# Patient Record
Sex: Male | Born: 1960 | Race: White | Hispanic: No | Marital: Married | Smoking: Never smoker
Health system: Southern US, Community
[De-identification: ages and names within clinical notes are randomized; demographics above are authoritative.]

## PROBLEM LIST (undated history)

## (undated) DIAGNOSIS — K589 Irritable bowel syndrome without diarrhea: Secondary | ICD-10-CM

## (undated) DIAGNOSIS — F329 Major depressive disorder, single episode, unspecified: Secondary | ICD-10-CM

## (undated) DIAGNOSIS — E119 Type 2 diabetes mellitus without complications: Secondary | ICD-10-CM

## (undated) DIAGNOSIS — R6881 Early satiety: Secondary | ICD-10-CM

## (undated) DIAGNOSIS — R1013 Epigastric pain: Secondary | ICD-10-CM

## (undated) DIAGNOSIS — E785 Hyperlipidemia, unspecified: Secondary | ICD-10-CM

## (undated) DIAGNOSIS — K219 Gastro-esophageal reflux disease without esophagitis: Secondary | ICD-10-CM

## (undated) DIAGNOSIS — R0609 Other forms of dyspnea: Secondary | ICD-10-CM

## (undated) DIAGNOSIS — F419 Anxiety disorder, unspecified: Secondary | ICD-10-CM

## (undated) DIAGNOSIS — R000 Tachycardia, unspecified: Secondary | ICD-10-CM

## (undated) DIAGNOSIS — D649 Anemia, unspecified: Secondary | ICD-10-CM

## (undated) DIAGNOSIS — F32A Depression, unspecified: Secondary | ICD-10-CM

## (undated) DIAGNOSIS — R634 Abnormal weight loss: Secondary | ICD-10-CM

## (undated) DIAGNOSIS — B029 Zoster without complications: Secondary | ICD-10-CM

## (undated) DIAGNOSIS — R0600 Dyspnea, unspecified: Secondary | ICD-10-CM

## (undated) HISTORY — PX: TONSILLECTOMY: SUR1361

## (undated) HISTORY — PX: TOOTH EXTRACTION: SUR596

## (undated) HISTORY — DX: Zoster without complications: B02.9

## (undated) HISTORY — PX: VASECTOMY: SHX75

## (undated) HISTORY — DX: Type 2 diabetes mellitus without complications: E11.9

---

## 2006-05-20 ENCOUNTER — Ambulatory Visit: Payer: Self-pay | Admitting: Family Medicine

## 2007-11-25 ENCOUNTER — Emergency Department: Payer: Self-pay | Admitting: Emergency Medicine

## 2007-12-06 ENCOUNTER — Other Ambulatory Visit: Payer: Self-pay

## 2007-12-06 ENCOUNTER — Ambulatory Visit: Payer: Self-pay | Admitting: Orthopedic Surgery

## 2007-12-08 ENCOUNTER — Ambulatory Visit: Payer: Self-pay | Admitting: Orthopedic Surgery

## 2008-05-18 HISTORY — PX: FRACTURE SURGERY: SHX138

## 2008-10-17 ENCOUNTER — Ambulatory Visit: Payer: Self-pay | Admitting: Family Medicine

## 2009-03-18 IMAGING — CR DG CLAVICLE*L*
1 series · 2 of 2 positions shown · non-contrast
Comparison: none

REASON FOR EXAM: post open reduction and internal fixation left clavicle
COMMENTS:   Bedside (portable):Y

[Series 1: view not recorded · 0.17mm/px · 2 of 2 slices shown]
[im 1/2]
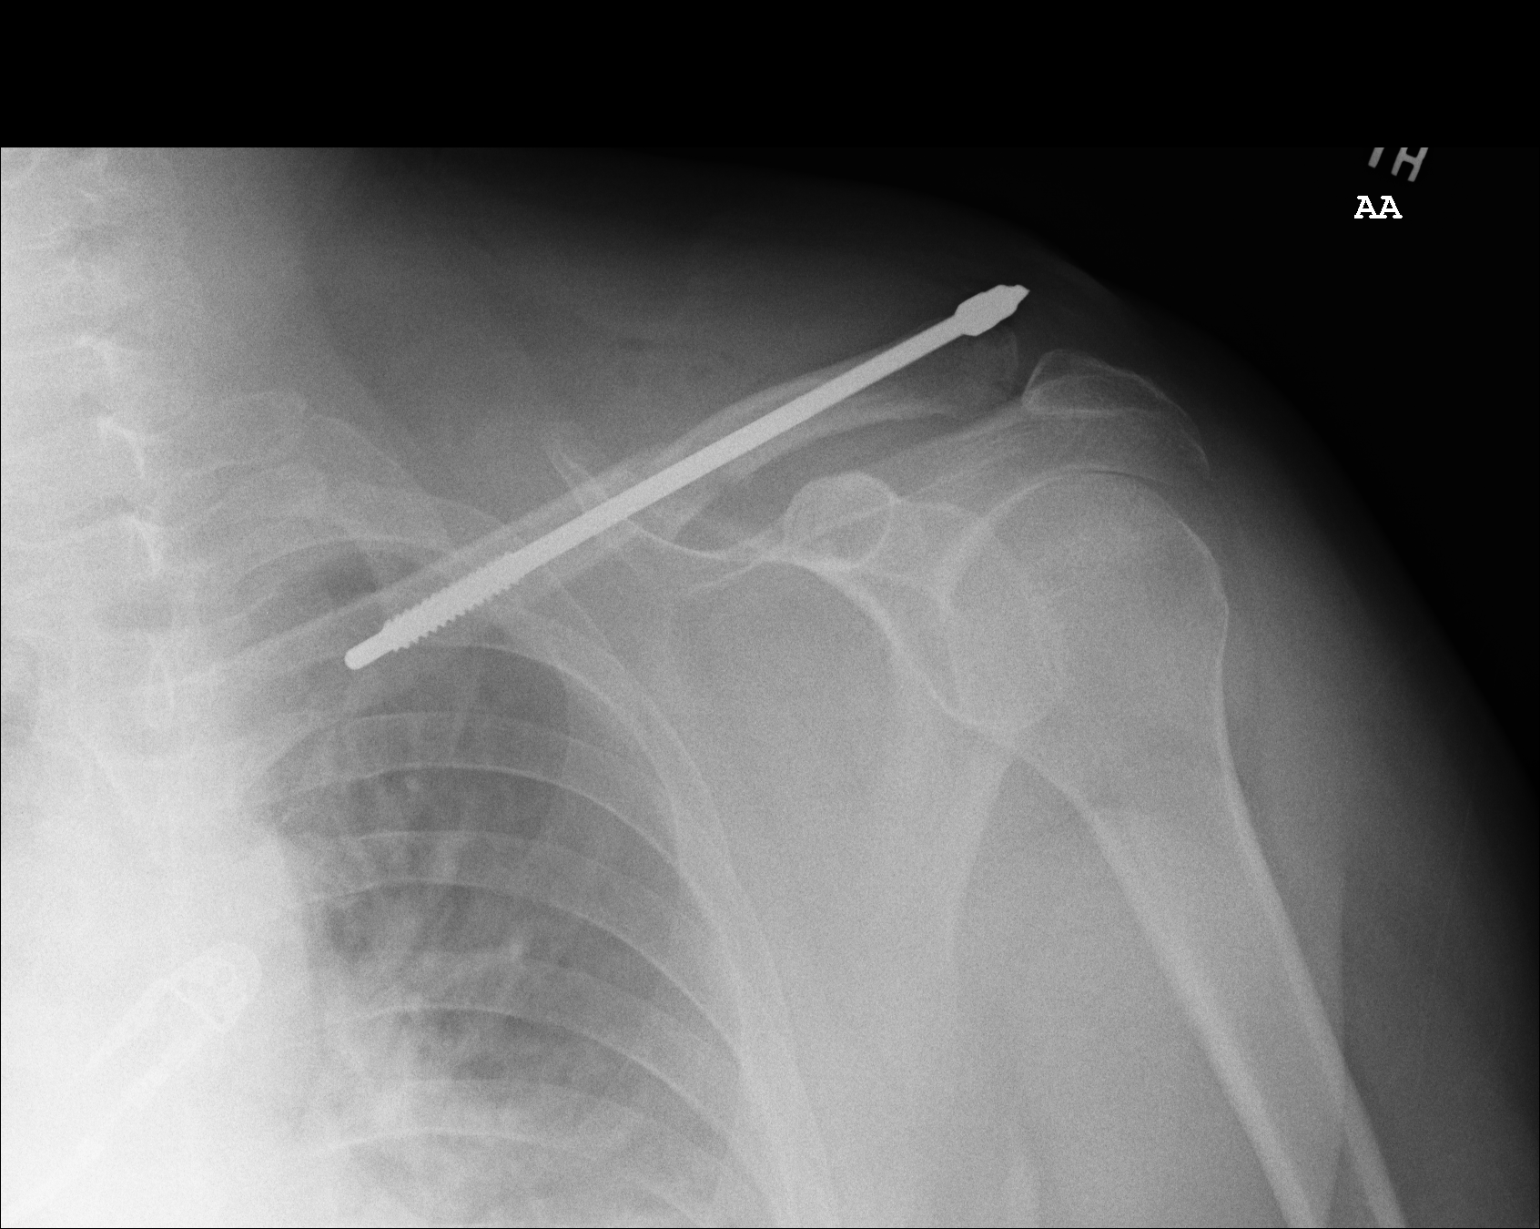
[im 2/2]
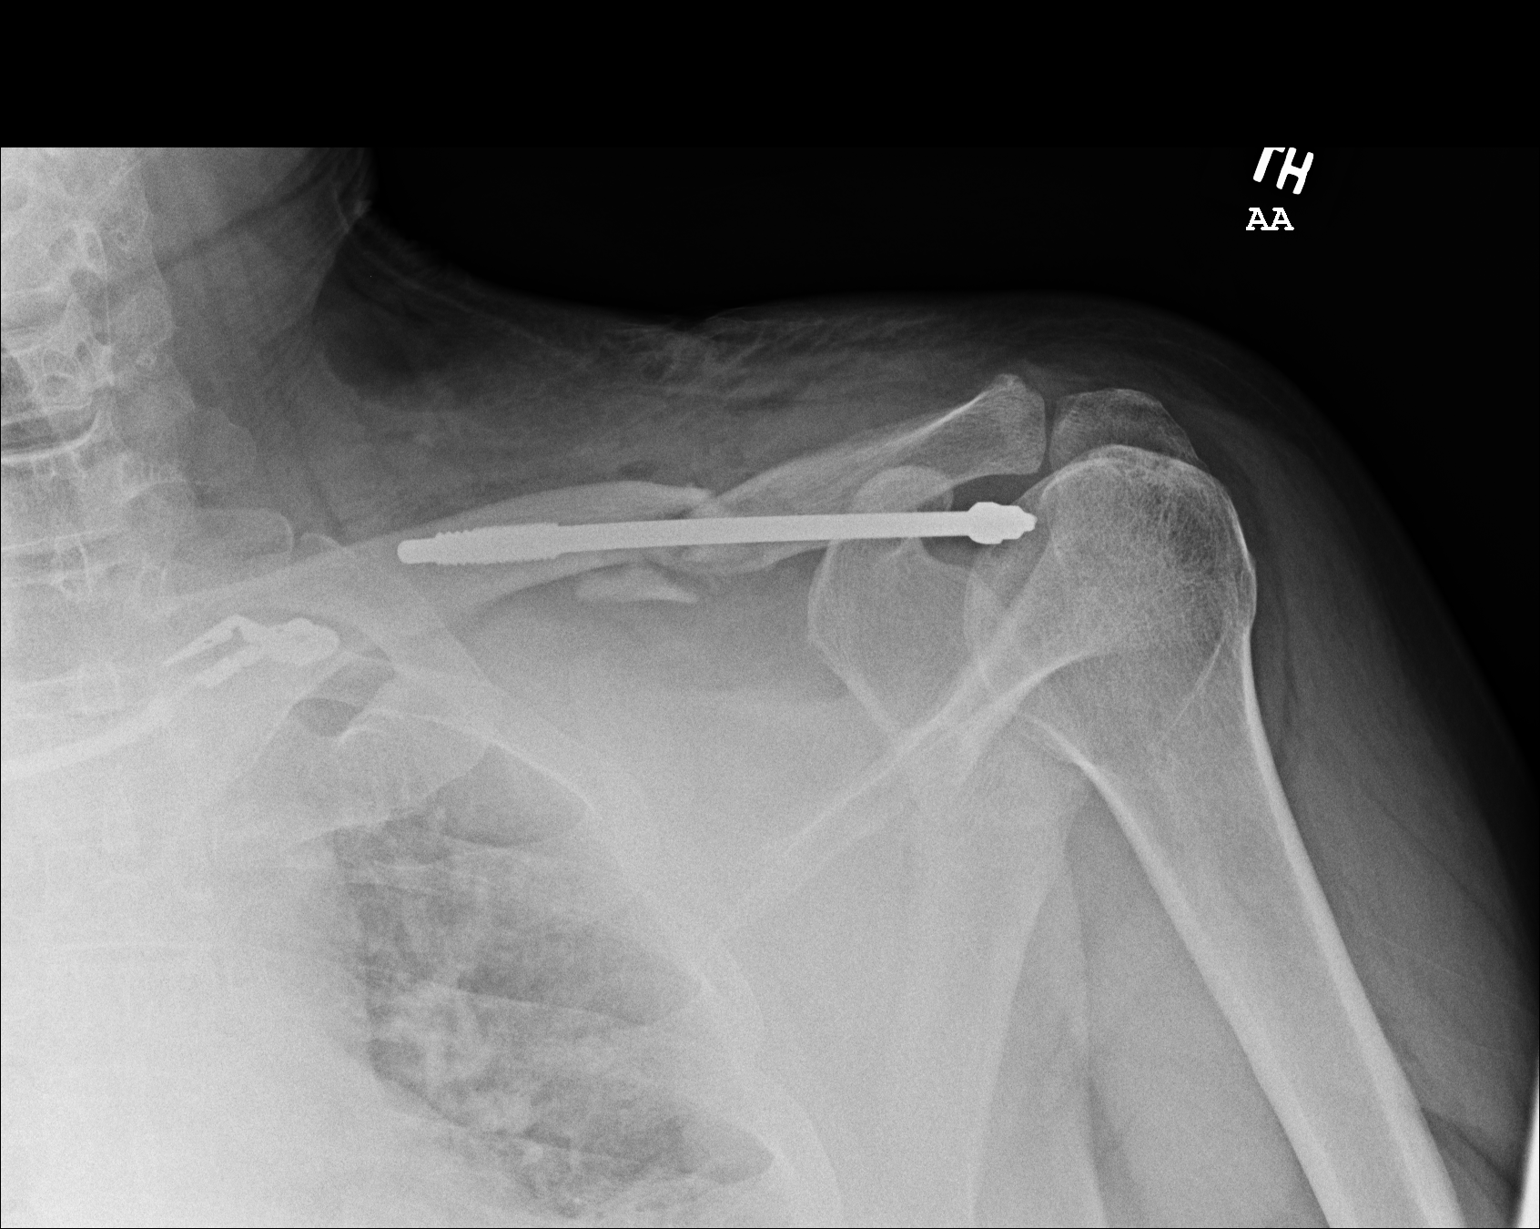

[2 of 2 positions shown; findings below may reference images not displayed]

PROCEDURE:     DXR - DXR CLAVICLE LEFT  - December 08, 2007  [DATE]

RESULT:     Images of the LEFT clavicle demonstrate an orthopedic pin
through the clavicle. The hardware is intact. A comminuted mid shaft LEFT
clavicular fracture is demonstrated.  In comparison with previous study of
11/25/2007, there is improved alignment of the comminuted mid shaft
clavicular fracture.
IMPRESSION: Please see above.

## 2009-04-01 ENCOUNTER — Encounter: Payer: Self-pay | Admitting: Cardiology

## 2009-04-02 ENCOUNTER — Encounter: Payer: Self-pay | Admitting: Cardiology

## 2010-06-18 NOTE — Progress Notes (Signed)
Summary: [facility]  [facility]   Imported By: Harlon Flor 04/03/2009 11:23:03  _____________________________________________________________________  External Attachment:    Type:   Image     Comment:   External Document

## 2016-04-20 ENCOUNTER — Emergency Department: Payer: 59

## 2016-04-20 ENCOUNTER — Encounter: Payer: Self-pay | Admitting: *Deleted

## 2016-04-20 ENCOUNTER — Emergency Department
Admission: EM | Admit: 2016-04-20 | Discharge: 2016-04-20 | Disposition: A | Discharge status: Home / Self Care | Payer: 59 | Attending: Emergency Medicine | Admitting: Emergency Medicine

## 2016-04-20 DIAGNOSIS — R1011 Right upper quadrant pain: Secondary | ICD-10-CM | POA: Diagnosis present

## 2016-04-20 DIAGNOSIS — R0602 Shortness of breath: Secondary | ICD-10-CM | POA: Insufficient documentation

## 2016-04-20 DIAGNOSIS — R05 Cough: Secondary | ICD-10-CM | POA: Diagnosis not present

## 2016-04-20 DIAGNOSIS — R0789 Other chest pain: Secondary | ICD-10-CM

## 2016-04-20 LAB — BASIC METABOLIC PANEL
Anion gap: 9 (ref 5–15)
BUN: 17 mg/dL (ref 6–20)
CO2: 23 mmol/L (ref 22–32)
Calcium: 9.5 mg/dL (ref 8.9–10.3)
Chloride: 101 mmol/L (ref 101–111)
Creatinine, Ser: 0.94 mg/dL (ref 0.61–1.24)
GFR calc Af Amer: >60 mL/min (ref >60)
GFR calc non Af Amer: >60 mL/min (ref >60)
Glucose, Bld: 360 mg/dL — ABNORMAL HIGH (ref 65–99)
Potassium: 3.8 mmol/L (ref 3.5–5.1)
Sodium: 133 mmol/L — ABNORMAL LOW (ref 135–145)

## 2016-04-20 LAB — CBC
HCT: 43.3 % (ref 40.0–52.0)
Hemoglobin: 15.7 g/dL (ref 13.0–18.0)
MCH: 32.6 pg (ref 26.0–34.0)
MCHC: 36.2 g/dL — ABNORMAL HIGH (ref 32.0–36.0)
MCV: 90.2 fL (ref 80.0–100.0)
Platelets: 208 10*3/uL (ref 150–440)
RBC: 4.8 MIL/uL (ref 4.40–5.90)
RDW: 13 % (ref 11.5–14.5)
WBC: 10.3 10*3/uL (ref 3.8–10.6)

## 2016-04-20 LAB — HEPATIC FUNCTION PANEL
ALT: 21 U/L (ref 17–63)
AST: 25 U/L (ref 15–41)
Albumin: 4.2 g/dL (ref 3.5–5.0)
Alkaline Phosphatase: 97 U/L (ref 38–126)
Bilirubin, Direct: 0.1 mg/dL (ref 0.1–0.5)
Indirect Bilirubin: 1.3 mg/dL — ABNORMAL HIGH (ref 0.3–0.9)
Total Bilirubin: 1.4 mg/dL — ABNORMAL HIGH (ref 0.3–1.2)
Total Protein: 7.7 g/dL (ref 6.5–8.1)

## 2016-04-20 LAB — TROPONIN I: Troponin I: <0.03 ng/mL (ref <0.03)

## 2016-04-20 LAB — FIBRIN DERIVATIVES D-DIMER (ARMC ONLY): Fibrin derivatives D-dimer (ARMC): 124 (ref 0–499)

## 2016-04-20 LAB — LIPASE, BLOOD: Lipase: 27 U/L (ref 11–51)

## 2016-04-20 MED ORDER — VALACYCLOVIR HCL 1 G PO TABS: 1000.0000 mg | ORAL_TABLET | Freq: Three times a day (TID) | ORAL | 0 refills | Status: DC

## 2016-04-20 MED ORDER — TRAMADOL HCL 50 MG PO TABS: 50.0000 mg | ORAL_TABLET | Freq: Four times a day (QID) | ORAL | 0 refills | Status: DC | PRN

## 2016-04-20 NOTE — ED Triage Notes (Signed)
Pt reports right side chest pain since thanksgiving day. Pain has since moved into middle of chest and back.  Pt seen at urgent care and was taking naproxen.  Intermittent sob.  No n/v/d.  Pt alert.

## 2016-04-20 NOTE — ED Notes (Signed)
Resumed care from kate rn.  Pt alert.  nsr on monitor.

## 2016-04-20 NOTE — ED Provider Notes (Signed)
[facility] Emergency Department Provider Note   ____________________________________________   I have reviewed the triage vital signs and the nursing notes.   HISTORY  Chief Complaint Chest Pain   History limited by: Not Limited   HPI Jeffrey Velazquez is a 55 y.o. male who presents to the emergency department today because of right lower chest and right upper abdominal pain. This pain started roughly 10 days ago. It was located more in the front of his chest. It is now started spreading around to his back and across the front of his chest. It does not cross the midline. He describes it is severe pain. Does feel like some needles or try to come out from the inside. He has had some associated shortness of breath. He has had a cough although it is been nonproductive. No fevers. No history of trauma. No rash.   No past medical history on file.  There are no active problems to display for this patient.   No past surgical history on file.  Prior to Admission medications   Not on File    Allergies Codeine  No family history on file.  Social History Social History  Substance Use Topics  . Smoking status: Never Smoker  . Smokeless tobacco: Never Used  . Alcohol use No    Review of Systems  Constitutional: Negative for fever. Cardiovascular: Negative for chest pain. Respiratory: Positive for shortness of breath and cough. Gastrointestinal: Negative for abdominal pain, vomiting and diarrhea. Genitourinary: Negative for dysuria. Musculoskeletal: Positive for right lower chest pain. Skin: Negative for rash. Neurological: Negative for headaches, focal weakness or numbness.  10-point ROS otherwise negative.  ____________________________________________   PHYSICAL EXAM:  VITAL SIGNS: ED Triage Vitals  Enc Vitals Group     BP 04/20/16 1910 (!) 145/70     Pulse Rate 04/20/16 1910 98     Resp 04/20/16 1910 20     Temp 04/20/16 1910 98.1 F  (36.7 C)     Temp Source 04/20/16 1910 Oral     SpO2 04/20/16 1910 100 %     Weight 04/20/16 1907 177 lb (80.3 kg)     Height 04/20/16 1907 5\' 8"  (1.727 m)     Head Circumference --      Peak Flow --      Pain Score 04/20/16 1907 3   Constitutional: Alert and oriented. Well appearing and in no distress. Eyes: Conjunctivae are normal. Normal extraocular movements. ENT   Head: Normocephalic and atraumatic.   Nose: No congestion/rhinnorhea.   Mouth/Throat: Mucous membranes are moist.   Neck: No stridor. Hematological/Lymphatic/Immunilogical: No cervical lymphadenopathy. Cardiovascular: Normal rate, regular rhythm.  No murmurs, rubs, or gallops.  Respiratory: Normal respiratory effort without tachypnea nor retractions. Breath sounds are clear and equal bilaterally. No wheezes/rales/rhonchi. Gastrointestinal: Soft and slightly tender in the right upper quadrant.  Genitourinary: Deferred Musculoskeletal: Normal range of motion in all extremities. No lower extremity edema. Neurologic:  Normal speech and language. No gross focal neurologic deficits are appreciated.  Skin:  Skin is warm, dry and intact. No rash noted. Psychiatric: Mood and affect are normal. Speech and behavior are normal. Patient exhibits appropriate insight and judgment.  ____________________________________________    LABS (pertinent positives/negatives)  Labs Reviewed  BASIC METABOLIC PANEL - Abnormal; Notable for the following:       Result Value   Sodium 133 (*)    Glucose, Bld 360 (*)    All other components within normal limits  CBC -  Abnormal; Notable for the following:    MCHC 36.2 (*)    All other components within normal limits  TROPONIN I     ____________________________________________   EKG  I, Phineas SemenGraydon Cecila Satcher, attending physician, personally viewed and interpreted this EKG  EKG Time: 1908 Rate: 95 Rhythm: normal sinus rhythm Axis: normal Intervals: qtc 454 QRS: narrow ST  changes: no st elevation Impression: normal ekg   ____________________________________________    RADIOLOGY  CXR IMPRESSION:  No active cardiopulmonary disease.   ____________________________________________   PROCEDURES  Procedures  ____________________________________________   INITIAL IMPRESSION / ASSESSMENT AND PLAN / ED COURSE  Pertinent labs & imaging results that were available during my care of the patient were reviewed by me and considered in my medical decision making (see chart for details).  Patient presented to the emergency department today because of concerns for right lower chest right upper quadrant abdominal pain this been going on for about the past 10 days. Physical exam he is mildly tender over the area. No rash noted. Work without any concerning signs. Will plan on obtaining a d-dimer to rule out PE given that he has had some shortness of breath. Additionally will get right upper quadrant ultrasound.  Clinical Course    Right upper quadrant ultrasound and d-dimer are both negative. This point I think most likely examination to be shingles without the classic eruption. Patient's pain certainly is distributed in a dermatomal pattern. Will plan on giving patient prescription for pain medication and antivirals. ____________________________________________   FINAL CLINICAL IMPRESSION(S) / ED DIAGNOSES  Final diagnoses:  RUQ pain  Chest wall pain     Note: This dictation was prepared with Dragon dictation. Any transcriptional errors that result from this process are unintentional    Phineas SemenGraydon Ronnica Dreese, MD 04/20/16 [phone]

## 2016-04-20 NOTE — ED Notes (Signed)
Pt states central CP that radiates to R side and back. Pt states pain began thanksgiving. Pt states 1 week before that he had a rubber sheet about 20lb hit him in central chest. Pt states deep breathing, movement, eating does not make pain worse. Pt states some nausea but denies SOB, weakness, vomiting, sweating. 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 Pt in no distress at this time, speaking in complete sentences, lying on stretcher, wife at bedside.

## 2016-04-20 NOTE — Discharge Instructions (Signed)
Please seek medical attention for any high fevers, chest pain, shortness of breath, change in behavior, persistent vomiting, bloody stool or any other new or concerning symptoms.  

## 2016-04-23 ENCOUNTER — Emergency Department
Admission: EM | Admit: 2016-04-23 | Discharge: 2016-04-23 | Disposition: A | Discharge status: Home / Self Care | Payer: 59 | Attending: Emergency Medicine | Admitting: Emergency Medicine

## 2016-04-23 ENCOUNTER — Encounter: Payer: Self-pay | Admitting: *Deleted

## 2016-04-23 ENCOUNTER — Encounter: Payer: Self-pay | Admitting: Family Medicine

## 2016-04-23 ENCOUNTER — Ambulatory Visit (INDEPENDENT_AMBULATORY_CARE_PROVIDER_SITE_OTHER): Payer: 59 | Admitting: Family Medicine

## 2016-04-23 ENCOUNTER — Emergency Department: Payer: 59

## 2016-04-23 VITALS — BP 142/86 | HR 96 | Temp 97.7°F | Ht 68.0 in | Wt 172.0 lb

## 2016-04-23 DIAGNOSIS — E139 Other specified diabetes mellitus without complications: Secondary | ICD-10-CM | POA: Diagnosis not present

## 2016-04-23 DIAGNOSIS — R0602 Shortness of breath: Secondary | ICD-10-CM | POA: Diagnosis not present

## 2016-04-23 DIAGNOSIS — R109 Unspecified abdominal pain: Secondary | ICD-10-CM | POA: Insufficient documentation

## 2016-04-23 DIAGNOSIS — R0789 Other chest pain: Secondary | ICD-10-CM | POA: Diagnosis not present

## 2016-04-23 DIAGNOSIS — Z794 Long term (current) use of insulin: Secondary | ICD-10-CM | POA: Diagnosis not present

## 2016-04-23 DIAGNOSIS — Z791 Long term (current) use of non-steroidal anti-inflammatories (NSAID): Secondary | ICD-10-CM | POA: Insufficient documentation

## 2016-04-23 DIAGNOSIS — R079 Chest pain, unspecified: Secondary | ICD-10-CM

## 2016-04-23 LAB — URINALYSIS, COMPLETE (UACMP) WITH MICROSCOPIC
Bacteria, UA: NONE SEEN
Bilirubin Urine: NEGATIVE
Glucose, UA: >=500 mg/dL — AB
Hgb urine dipstick: NEGATIVE
Ketones, ur: 20 mg/dL — AB
Leukocytes, UA: NEGATIVE
Nitrite: NEGATIVE
Protein, ur: NEGATIVE mg/dL
Specific Gravity, Urine: 1.038 — ABNORMAL HIGH (ref 1.005–1.030)
Squamous Epithelial / LPF: NONE SEEN
pH: 5 (ref 5.0–8.0)

## 2016-04-23 LAB — CBC WITH DIFFERENTIAL/PLATELET
Basophils Absolute: 0 10*3/uL (ref 0–0.1)
Basophils Relative: 0 %
Eosinophils Absolute: 0 10*3/uL (ref 0–0.7)
Eosinophils Relative: 0 %
HCT: 43.4 % (ref 40.0–52.0)
Hemoglobin: 15.8 g/dL (ref 13.0–18.0)
Lymphocytes Relative: 20 %
Lymphs Abs: 1.9 10*3/uL (ref 1.0–3.6)
MCH: 32 pg (ref 26.0–34.0)
MCHC: 36.3 g/dL — ABNORMAL HIGH (ref 32.0–36.0)
MCV: 88 fL (ref 80.0–100.0)
Monocytes Absolute: 0.8 10*3/uL (ref 0.2–1.0)
Monocytes Relative: 8 %
Neutro Abs: 7 10*3/uL — ABNORMAL HIGH (ref 1.4–6.5)
Neutrophils Relative %: 72 %
Platelets: 190 10*3/uL (ref 150–440)
RBC: 4.93 MIL/uL (ref 4.40–5.90)
RDW: 12.6 % (ref 11.5–14.5)
WBC: 9.8 10*3/uL (ref 3.8–10.6)

## 2016-04-23 LAB — TROPONIN I: Troponin I: <0.03 ng/mL (ref <0.03)

## 2016-04-23 LAB — BASIC METABOLIC PANEL
Anion gap: 10 (ref 5–15)
BUN: 14 mg/dL (ref 6–20)
CO2: 23 mmol/L (ref 22–32)
Calcium: 9.5 mg/dL (ref 8.9–10.3)
Chloride: 103 mmol/L (ref 101–111)
Creatinine, Ser: 0.88 mg/dL (ref 0.61–1.24)
GFR calc Af Amer: >60 mL/min (ref >60)
GFR calc non Af Amer: >60 mL/min (ref >60)
Glucose, Bld: 235 mg/dL — ABNORMAL HIGH (ref 65–99)
Potassium: 3.8 mmol/L (ref 3.5–5.1)
Sodium: 136 mmol/L (ref 135–145)

## 2016-04-23 MED ORDER — METFORMIN HCL 500 MG PO TABS: 500.0000 mg | ORAL_TABLET | Freq: Every day | ORAL | 0 refills | Status: DC

## 2016-04-23 MED ORDER — IOPAMIDOL (ISOVUE-370) INJECTION 76%
75.0000 mL | Freq: Once | INTRAVENOUS | Status: AC | PRN
  Administered 2016-04-23: 75 mL via INTRAVENOUS

## 2016-04-23 MED ORDER — SODIUM CHLORIDE 0.9 % IV BOLUS (SEPSIS)
1000.0000 mL | Freq: Once | INTRAVENOUS | Status: AC
  Administered 2016-04-23: 1000 mL via INTRAVENOUS

## 2016-04-23 NOTE — ED Triage Notes (Signed)
Pt was seen in ED Monday, diagnosed with shingles, no rash noted, pt complains of right sided chest pain, sent by MD

## 2016-04-23 NOTE — ED Provider Notes (Addendum)
[facility] Emergency Department Provider Note  ____________________________________________   First MD Initiated Contact with Patient 04/23/16 1645     (approximate)3  I have reviewed the triage vital signs and the nursing notes.   HISTORY  Chief Complaint Chest Pain and Herpes Zoster   HPI CAINAN TRULL is a 55 y.o. male without any chronic medical problems presenting with right-sided chest pain. He says that the pain started around the time of Thanksgiving and feels like a stinging, burning pain that started in his right lateral chest and has been radiating to the front of the chest. He was diagnosed with shingles 3 days ago and treated presumptively with valacyclovir. However, since that his pain is worsened and he was sent to the emergency department today for a likely CT angiography to rule out PE. However, he had a negative d-dimer during his visit on December 4.He says the pain is a 4 out of 10 at this time. Denies any shortness of breath at this time. Says that he awoke last Tylenol night and could not get back to sleep for several hours because of the intensity of his pain. He is now requesting any pain medication at this time. He has been compliant with his outpatient meds.   Past Medical History:  Diagnosis Date  . Shingles     There are no active problems to display for this patient.   History reviewed. No pertinent surgical history.  Prior to Admission medications   Medication Sig Start Date End Date Taking? Authorizing Provider  naproxen (NAPROSYN) 500 MG tablet Take 500 mg by mouth 2 (two) times daily with a meal.    Historical Provider, MD  traMADol (ULTRAM) 50 MG tablet Take 1 tablet (50 mg total) by mouth every 6 (six) hours as needed. Patient not taking: Reported on 04/23/2016 04/20/16 04/20/17  Phineas Semen, MD  valACYclovir (VALTREX) 1000 MG tablet Take 1 tablet (1,000 mg total) by mouth 3 (three) times daily. 04/20/16   Phineas Semen, MD    Allergies Codeine  No family history on file.  Social History Social History  Substance Use Topics  . Smoking status: Never Smoker  . Smokeless tobacco: Never Used  . Alcohol use No    Review of Systems Constitutional: No fever/chills Eyes: No visual changes. ENT: No sore throat. Cardiovascular: As above Respiratory: Denies shortness of breath. Gastrointestinal: No abdominal pain.  No nausea, no vomiting.  No diarrhea.  No constipation. Genitourinary: Negative for dysuria. Musculoskeletal: Negative for back pain. Skin: Negative for rash. Neurological: Negative for headaches, focal weakness or numbness.  10-point ROS otherwise negative.  ____________________________________________   PHYSICAL EXAM:  VITAL SIGNS: ED Triage Vitals  Enc Vitals Group     BP 04/23/16 1639 (!) 140/94     Pulse Rate 04/23/16 1639 90     Resp 04/23/16 1639 20     Temp 04/23/16 1639 98.1 F (36.7 C)     Temp Source 04/23/16 1639 Oral     SpO2 04/23/16 1639 100 %     Weight 04/23/16 1642 172 lb (78 kg)     Height 04/23/16 1642 5\' 8"  (1.727 m)     Head Circumference --      Peak Flow --      Pain Score 04/23/16 1642 3     Pain Loc --      Pain Edu? --      Excl. in GC? --     Constitutional: Alert and oriented.  Well appearing and in no acute distress. Eyes: Conjunctivae are normal. PERRL. EOMI. Head: Atraumatic. Nose: No congestion/rhinnorhea. Mouth/Throat: Mucous membranes are moist.   Neck: No stridor.   Cardiovascular: Normal rate, regular rhythm. Grossly normal heart sounds.   Respiratory: Normal respiratory effort.  No retractions. Lungs CTAB. Gastrointestinal: Soft and nontender. No distention.  Musculoskeletal: No lower extremity tenderness nor edema.  No joint effusions. Neurologic:  Normal speech and language. No gross focal neurologic deficits are appreciated.  Skin:  Skin is warm, dry and intact. No rash noted.No vesicles. No erythema. Psychiatric: Mood  and affect are normal. Speech and behavior are normal.  ____________________________________________   LABS (all labs ordered are listed, but only abnormal results are displayed)  Labs Reviewed  CBC WITH DIFFERENTIAL/PLATELET  BASIC METABOLIC PANEL  TROPONIN I  URINALYSIS, COMPLETE (UACMP) WITH MICROSCOPIC   ____________________________________________  EKG  ED ECG REPORT I, Cameren Earnest,  Teena Iraniavid M, the attending physician, personally viewed and interpreted this ECG.   Date: 04/23/2016  EKG Time: 1652  Rate: 91  Rhythm: normal sinus rhythm  Axis: Normal  Intervals:none  ST&T Change: No ST segment elevation or depression. No abnormal T-wave inversion.  ____________________________________________  RADIOLOGY  CT RENAL STONE STUDY (Final result)  Result time 04/23/16 18:00:48  Final result by Rise MuBenjamin McClintock, MD (04/23/16 18:00:48)           Narrative:   CLINICAL DATA: Initial evaluation for acute right flank pain.  EXAM: CT ABDOMEN AND PELVIS WITHOUT CONTRAST  TECHNIQUE: Multidetector CT imaging of the abdomen and pelvis was performed following the standard protocol without IV contrast.  COMPARISON: Prior ultrasound from 04/20/2016.  FINDINGS: Lower chest: Visualized lung bases are clear.  Hepatobiliary: Mild diffuse hypoattenuation of the liver compatible steatosis. Geographic fatty sparing noted within the central aspect of the liver and caudate lobe. Gallbladder within normal limits. No biliary dilatation.  Pancreas: Pancreas within normal limits.  Spleen: Spleen within normal limits.  Adrenals/Urinary Tract: Adrenal glands are normal. Kidneys equal in size. No nephrolithiasis or hydronephrosis. No radiopaque calculi seen along the course of either ureter. No hydroureter. Partially distended bladder within normal limits. No layering stones within the bladder lumen.  Stomach/Bowel: Stomach within normal limits. No evidence for bowel obstruction.  Appendix within normal limits. No acute inflammatory changes about the bowels. Mild colonic diverticulosis without evidence for acute diverticulitis.  Vascular/Lymphatic: Scattered atheromatous plaque within the intra- abdominal aorta. No aneurysm. No adenopathy.  Reproductive: Prostate normal.  Other: No free air or fluid. Small fat containing paraumbilical hernia noted.  Musculoskeletal: No acute osseous abnormality. No worrisome lytic or blastic osseous lesions.  IMPRESSION: 1. No CT evidence for nephrolithiasis or obstructive uropathy. 2. No other acute intra- abdominal or pelvic process identified. 3. Normal appendix. 4. Mild colonic diverticulosis without evidence for acute diverticulitis. 5. Hepatic steatosis.   Electronically Signed By: Rise MuBenjamin McClintock M.D. On: 04/23/2016 18:00          ____________________________________________   PROCEDURES  Procedure(s) performed:   Procedures  Critical Care performed:   ____________________________________________   INITIAL IMPRESSION / ASSESSMENT AND PLAN / ED COURSE  Pertinent labs & imaging results that were available during my care of the patient were reviewed by me and considered in my medical decision making (see chart for details).   Clinical Course   ----------------------------------------- 7:48 PM on 04/23/2016 -----------------------------------------  Patient resting comfortable at this time. Reassuring CAT scans of the chest as well as abdomen. Possible zoster that was caught during a prodrome and  never developed a rash. The patient started taking antiviral. He'll continue with antiviral. I will also be starting him on metformin but won't wait 48 hours since just had a CAT scan with contrast. He will follow-up with his primary care doctor for further testing including a likely A1c. He is understanding that he likely has new-onset diabetes and says that he was told that he had high blood sugar  several years ago but that was given a glucometer at home which ranged between 90 and 160 and was told that time they would not be started on medication. He doesn't a family history of diabetes.`   ____________________________________________   FINAL CLINICAL IMPRESSION(S) / ED DIAGNOSES  Final diagnoses:  Right flank pain  Right flank pain. Diabetes.    NEW MEDICATIONS STARTED DURING THIS VISIT:  New Prescriptions   No medications on file     Note:  This document was prepared using Dragon voice recognition software and may include unintentional dictation errors.    Myrna Blazeravid Matthew Leng Montesdeoca, MD 04/23/16 1950  Patient also found to have thyroid nodule. I updated the patient about this newly following up with his primary care doctor. Added to his discharge instructions.    Myrna Blazeravid Matthew Jessamy Torosyan, MD 04/23/16 Rosamaria [id number]

## 2016-04-23 NOTE — ED Notes (Signed)
Received report from patient's pcp stating that patient is being sent over to ED for chest pain.  PCP felt that patient needs a CT to rule out a PE.  PCP confirmed that patient was being sent for ED eval, not outpatient CT scan.

## 2016-04-23 NOTE — Progress Notes (Signed)
Name: Jeffrey GoodpastureJohn D Velazquez   MRN: [id number]    DOB: [birth year]   Date:04/23/2016       Progress Note  Subjective  Chief Complaint  Chief Complaint  Patient presents with  . Herpes Zoster    Pt stated went to hospt. last night for shingle last night..    Chest Pain   This is a new problem. The current episode started in the past 7 days. The onset quality is sudden. The problem occurs daily. The problem has been gradually worsening. The pain is present in the lateral region. The pain is at a severity of 4/10. The pain is moderate. The quality of the pain is described as burning. The pain radiates to the mid back. Associated symptoms include back pain, dizziness and shortness of breath. Pertinent negatives include no abdominal pain, claudication, cough, diaphoresis, exertional chest pressure, fever, headaches, hemoptysis, irregular heartbeat, leg pain, lower extremity edema, malaise/fatigue, nausea, near-syncope, numbness, orthopnea, palpitations, PND, sputum production, syncope, vomiting or weakness. The pain is aggravated by movement. Treatments tried: tramadol. The treatment provided mild relief. Prior diagnostic workup includes chest x-ray.    No problem-specific Assessment & Plan notes found for this encounter.   Past Medical History:  Diagnosis Date  . Shingles     History reviewed. No pertinent surgical history.  History reviewed. No pertinent family history.  Social History   Social History  . Marital status: Married    Spouse name: N/A  . Number of children: N/A  . Years of education: N/A   Occupational History  . Not on file.   Social History Main Topics  . Smoking status: Never Smoker  . Smokeless tobacco: Never Used  . Alcohol use No  . Drug use: Unknown  . Sexual activity: Not on file   Other Topics Concern  . Not on file   Social History Narrative  . No narrative on file    Allergies  Allergen Reactions  . Codeine Other (See Comments)     Review of  Systems  Constitutional: Negative for chills, diaphoresis, fever, malaise/fatigue and weight loss.  HENT: Negative for ear discharge, ear pain and sore throat.   Eyes: Negative for blurred vision.  Respiratory: Positive for shortness of breath. Negative for cough, hemoptysis, sputum production and wheezing.   Cardiovascular: Positive for chest pain. Negative for palpitations, orthopnea, claudication, leg swelling, syncope, PND and near-syncope.  Gastrointestinal: Negative for abdominal pain, blood in stool, constipation, diarrhea, heartburn, melena, nausea and vomiting.  Genitourinary: Negative for dysuria, frequency, hematuria and urgency.  Musculoskeletal: Positive for back pain. Negative for joint pain, myalgias and neck pain.  Skin: Negative for rash.  Neurological: Positive for dizziness. Negative for tingling, sensory change, focal weakness, weakness, numbness and headaches.  Endo/Heme/Allergies: Negative for environmental allergies and polydipsia. Does not bruise/bleed easily.  Psychiatric/Behavioral: Negative for depression and suicidal ideas. The patient is not nervous/anxious and does not have insomnia.      Objective  Vitals:   04/23/16 1442  BP: (!) 142/86  Pulse: 96  Temp: 97.7 F (36.5 C)  SpO2: 98%  Weight: 172 lb (78 kg)  Height: 5\' 8"  (1.727 m)    Physical Exam  Constitutional: He is oriented to person, place, and time and well-developed, well-nourished, and in no distress.  HENT:  Head: Normocephalic.  Right Ear: External ear normal.  Left Ear: External ear normal.  Nose: Nose normal.  Mouth/Throat: Oropharynx is clear and moist.  Eyes: Conjunctivae and EOM are normal. Pupils are equal,  round, and reactive to light. Right eye exhibits no discharge. Left eye exhibits no discharge. No scleral icterus.  Neck: Normal range of motion. Neck supple. No JVD present. No tracheal deviation present. No thyromegaly present.  Cardiovascular: Normal rate, regular rhythm,  normal heart sounds and intact distal pulses.  Exam reveals no gallop and no friction rub.   No murmur heard. Pulmonary/Chest: Breath sounds normal. No respiratory distress. He has no wheezes. He has no rales. Chest wall is not dull to percussion. He exhibits tenderness and bony tenderness. He exhibits no mass, no laceration, no crepitus, no edema, no deformity, no swelling and no retraction.    Abdominal: Soft. Bowel sounds are normal. He exhibits no mass. There is no hepatosplenomegaly. There is no tenderness. There is no rebound, no guarding and no CVA tenderness.  Musculoskeletal: Normal range of motion. He exhibits no edema or tenderness.  Lymphadenopathy:    He has no cervical adenopathy.  Neurological: He is alert and oriented to person, place, and time. He has normal sensation, normal strength, normal reflexes and intact cranial nerves. No cranial nerve deficit.  Skin: Skin is warm. No rash noted.  Psychiatric: Mood and affect normal.  Nursing note and vitals reviewed.     Assessment & Plan  Problem List Items Addressed This Visit    None    Visit Diagnoses    Chest pain, unspecified type    -  Primary   ct angiogram   Shortness of breath       Right-sided chest wall pain            Dr. Elizabeth Sauereanna Danylah Holden [facility]  04/23/16

## 2016-04-23 NOTE — ED Notes (Signed)
Pt taken to CT at this time.

## 2016-05-21 DIAGNOSIS — Z7189 Other specified counseling: Secondary | ICD-10-CM | POA: Insufficient documentation

## 2016-05-21 DIAGNOSIS — Z7185 Encounter for immunization safety counseling: Secondary | ICD-10-CM | POA: Insufficient documentation

## 2016-06-08 DIAGNOSIS — E119 Type 2 diabetes mellitus without complications: Secondary | ICD-10-CM | POA: Insufficient documentation

## 2016-06-08 DIAGNOSIS — E782 Mixed hyperlipidemia: Secondary | ICD-10-CM | POA: Insufficient documentation

## 2016-07-10 ENCOUNTER — Other Ambulatory Visit: Payer: Self-pay | Admitting: Neurology

## 2016-07-10 DIAGNOSIS — B0229 Other postherpetic nervous system involvement: Secondary | ICD-10-CM

## 2016-07-21 ENCOUNTER — Other Ambulatory Visit: Payer: Self-pay | Admitting: General Surgery

## 2016-07-21 ENCOUNTER — Ambulatory Visit
Admission: RE | Admit: 2016-07-21 | Discharge: 2016-07-21 | Disposition: A | Discharge status: Home / Self Care | Payer: 59 | Source: Ambulatory Visit | Attending: Neurology | Admitting: Neurology

## 2016-07-21 DIAGNOSIS — M5124 Other intervertebral disc displacement, thoracic region: Secondary | ICD-10-CM | POA: Insufficient documentation

## 2016-07-21 DIAGNOSIS — B0229 Other postherpetic nervous system involvement: Secondary | ICD-10-CM | POA: Insufficient documentation

## 2016-07-23 DIAGNOSIS — B0229 Other postherpetic nervous system involvement: Secondary | ICD-10-CM | POA: Insufficient documentation

## 2016-10-07 ENCOUNTER — Emergency Department
Admission: EM | Admit: 2016-10-07 | Discharge: 2016-10-07 | Disposition: A | Discharge status: Home / Self Care | Payer: 59 | Attending: Emergency Medicine | Admitting: Emergency Medicine

## 2016-10-07 ENCOUNTER — Emergency Department: Payer: 59

## 2016-10-07 ENCOUNTER — Encounter: Payer: Self-pay | Admitting: Emergency Medicine

## 2016-10-07 DIAGNOSIS — Z7984 Long term (current) use of oral hypoglycemic drugs: Secondary | ICD-10-CM | POA: Insufficient documentation

## 2016-10-07 DIAGNOSIS — R0789 Other chest pain: Secondary | ICD-10-CM

## 2016-10-07 DIAGNOSIS — E119 Type 2 diabetes mellitus without complications: Secondary | ICD-10-CM | POA: Diagnosis not present

## 2016-10-07 DIAGNOSIS — R1084 Generalized abdominal pain: Secondary | ICD-10-CM | POA: Diagnosis not present

## 2016-10-07 DIAGNOSIS — Z7982 Long term (current) use of aspirin: Secondary | ICD-10-CM | POA: Diagnosis not present

## 2016-10-07 DIAGNOSIS — Z79899 Other long term (current) drug therapy: Secondary | ICD-10-CM | POA: Insufficient documentation

## 2016-10-07 HISTORY — DX: Type 2 diabetes mellitus without complications: E11.9

## 2016-10-07 LAB — CBC
HCT: 44.2 % (ref 40.0–52.0)
Hemoglobin: 15.9 g/dL (ref 13.0–18.0)
MCH: 31.5 pg (ref 26.0–34.0)
MCHC: 36.1 g/dL — ABNORMAL HIGH (ref 32.0–36.0)
MCV: 87.4 fL (ref 80.0–100.0)
Platelets: 273 10*3/uL (ref 150–440)
RBC: 5.06 MIL/uL (ref 4.40–5.90)
RDW: 12.9 % (ref 11.5–14.5)
WBC: 11.2 10*3/uL — ABNORMAL HIGH (ref 3.8–10.6)

## 2016-10-07 LAB — HEPATIC FUNCTION PANEL
ALT: 21 U/L (ref 17–63)
AST: 29 U/L (ref 15–41)
Albumin: 4.6 g/dL (ref 3.5–5.0)
Alkaline Phosphatase: 69 U/L (ref 38–126)
Bilirubin, Direct: 0.2 mg/dL (ref 0.1–0.5)
Indirect Bilirubin: 0.5 mg/dL (ref 0.3–0.9)
Total Bilirubin: 0.7 mg/dL (ref 0.3–1.2)
Total Protein: 8.4 g/dL — ABNORMAL HIGH (ref 6.5–8.1)

## 2016-10-07 LAB — URINALYSIS, ROUTINE W REFLEX MICROSCOPIC
Bacteria, UA: NONE SEEN
Bilirubin Urine: NEGATIVE
Glucose, UA: >=500 mg/dL — AB
Hgb urine dipstick: NEGATIVE
Ketones, ur: NEGATIVE mg/dL
Leukocytes, UA: NEGATIVE
Nitrite: NEGATIVE
Protein, ur: NEGATIVE mg/dL
RBC / HPF: NONE SEEN RBC/hpf (ref 0–5)
Specific Gravity, Urine: 1.036 — ABNORMAL HIGH (ref 1.005–1.030)
Squamous Epithelial / LPF: NONE SEEN
pH: 5 (ref 5.0–8.0)

## 2016-10-07 LAB — BASIC METABOLIC PANEL
Anion gap: 10 (ref 5–15)
BUN: 9 mg/dL (ref 6–20)
CO2: 26 mmol/L (ref 22–32)
Calcium: 9.8 mg/dL (ref 8.9–10.3)
Chloride: 100 mmol/L — ABNORMAL LOW (ref 101–111)
Creatinine, Ser: 0.88 mg/dL (ref 0.61–1.24)
GFR calc Af Amer: >60 mL/min (ref >60)
GFR calc non Af Amer: >60 mL/min (ref >60)
Glucose, Bld: 258 mg/dL — ABNORMAL HIGH (ref 65–99)
Potassium: 3.6 mmol/L (ref 3.5–5.1)
Sodium: 136 mmol/L (ref 135–145)

## 2016-10-07 LAB — TROPONIN I: Troponin I: <0.03 ng/mL (ref <0.03)

## 2016-10-07 LAB — MAGNESIUM: Magnesium: 1.7 mg/dL (ref 1.7–2.4)

## 2016-10-07 MED ORDER — ONDANSETRON HCL 4 MG/2ML IJ SOLN
4.0000 mg | Freq: Once | INTRAMUSCULAR | Status: AC
  Administered 2016-10-07: 16:00:00 via INTRAVENOUS

## 2016-10-07 MED ORDER — IOPAMIDOL (ISOVUE-300) INJECTION 61%
30.0000 mL | Freq: Once | INTRAVENOUS | Status: AC | PRN
  Administered 2016-10-07: 30 mL via ORAL

## 2016-10-07 MED ORDER — IOPAMIDOL (ISOVUE-300) INJECTION 61%
100.0000 mL | Freq: Once | INTRAVENOUS | Status: AC | PRN
  Administered 2016-10-07: 100 mL via INTRAVENOUS

## 2016-10-07 MED ORDER — ONDANSETRON HCL 4 MG/2ML IJ SOLN
INTRAMUSCULAR | Status: AC
  Filled 2016-10-07: qty 2

## 2016-10-07 NOTE — Discharge Instructions (Signed)
As we discussed, your workup today was reassuring.  Though we do not know exactly what is causing your symptoms, it appears that you have no emergent medical condition at this time and that you are safe to go home and follow up as recommended in this paperwork. ° °Please return immediately to the Emergency Department if you develop any new or worsening symptoms that concern you. ° °

## 2016-10-07 NOTE — ED Notes (Signed)
Pt returned from CT °

## 2016-10-07 NOTE — ED Triage Notes (Signed)
Pt in via POV with complaints of right side chest pressure radiating to RUQ.  Pt reports associated shortness of breath, dizziness, nausea.  Pt reports diarrhea x several days.  Pt tachycardic, other vitals WDL.

## 2016-10-07 NOTE — ED Provider Notes (Addendum)
[facility] Emergency Department Provider Note  ____________________________________________   First MD Initiated Contact with Patient 10/07/16 1440     (approximate)  I have reviewed the triage vital signs and the nursing notes.   HISTORY  Chief Complaint Chest Pain    HPI Jeffrey Velazquez is a 56 y.o. male who presents for evaluation of right-sided chest "stinging" as well as upper abdominal pain and a sensation of swelling.  Initially it sounded as if this is been present for a few days, and it sounds as if it may have been worse for a few days, but upon further discussion with the patient and his wife these symptoms have actually been going on for months.  He has had extensive outpatient evaluations and emergency department evaluations including CT angiogram chest to rule out PE, CT renal stone protocol of the abdomen, outpatient thyroid ultrasound, outpatient abdominal ultrasounds, and extensive lab work evaluation.  He and his wife pointed out that no one has been able to tell him what is wrong but he continues to feel as if his "organs are swollen".  His wife states that at times she can see the swelling in the skin on his right flank but then it will go back down.  He was diagnosed with shingles about 6 months ago due to complaints of allodynia but they state that no rash ever appeared.  Over the last 6 months the patient has been increasingly depressed and staying at home, not being able to go to work, and having a variety of complaints related to fatigue and the symptoms as described above.  His wife states that sometimes his skin is so sensitive that when her hair brushes against the skin of his legs he will cry out.  He also reports that he does suffer from anxiety but he has never been diagnosed with depression.He reports that he has not been able to eat or drink well for the last few days although he does not explain exactly what that means, just that he has  not been able to eat.  Nothing makes his symptoms better nor worse and they have gradually been getting worse over months.  He says the symptoms are severe.Marland Kitchen.  He denies recent fever/chills, lower abdominal pain, dysuria..    Past Medical History:  Diagnosis Date  . Diabetes mellitus without complication (HCC)   . Shingles     There are no active problems to display for this patient.   Past Surgical History:  Procedure Laterality Date  . FRACTURE SURGERY Left 2010   Clavicle  . TONSILLECTOMY      Prior to Admission medications   Medication Sig Start Date End Date Taking? Authorizing Provider  aspirin EC 81 MG tablet Take 81 mg by mouth daily.   Yes [provider]  atorvastatin (LIPITOR) 20 MG tablet Take 20 mg by mouth daily.   Yes [provider]  cholecalciferol (VITAMIN D) 1000 units tablet Take 1,000 Units by mouth daily.   Yes [provider]  FOLIC ACID PO Take 1 tablet by mouth daily.   Yes [provider]  gabapentin (NEURONTIN) 300 MG capsule Take 600-900 mg by mouth 3 (three) times daily.   Yes [provider]  glimepiride (AMARYL) 2 MG tablet Take 2 mg by mouth 2 (two) times daily.   Yes [provider]  hydrOXYzine (ATARAX/VISTARIL) 25 MG tablet Take 25 mg by mouth every 8 (eight) hours as needed.   Yes [provider]  metFORMIN (GLUCOPHAGE) 500 MG tablet Take 1 tablet (500 mg total) by mouth daily with breakfast. Patient taking differently: Take 1,000 mg by mouth 2 (two) times daily with a meal.  04/26/16 04/26/17 Yes Schaevitz, Myra Rude, MD  vitamin B-12 (CYANOCOBALAMIN) 100 MCG tablet Take 100 mcg by mouth daily.   Yes [provider]  traMADol (ULTRAM) 50 MG tablet Take 1 tablet (50 mg total) by mouth every 6 (six) hours as needed. Patient not taking: Reported on 10/07/2016 04/20/16 04/20/17  Phineas Semen, MD    Allergies Codeine  No family history on file.  Social History Social  History  Substance Use Topics  . Smoking status: Never Smoker  . Smokeless tobacco: Never Used  . Alcohol use No    Review of Systems Constitutional: No fever/chills Eyes: No visual changes. ENT: No sore throat. Cardiovascular: +chest pain (right side tingling) Respiratory: Denies shortness of breath. Gastrointestinal: No abdominal pain.  No nausea, no vomiting.  No diarrhea.  No constipation. Genitourinary: Negative for dysuria. Musculoskeletal: Negative for neck pain.  Negative for back pain. Integumentary: Negative for rash. Neurological: Negative for headaches, focal weakness or numbness.   ____________________________________________   PHYSICAL EXAM:  VITAL SIGNS: ED Triage Vitals  Enc Vitals Group     BP 10/07/16 1410 132/87     Pulse Rate 10/07/16 1410 (!) 137     Resp 10/07/16 1410 20     Temp 10/07/16 1410 98.2 F (36.8 C)     Temp Source 10/07/16 1410 Oral     SpO2 10/07/16 1410 98 %     Weight 10/07/16 1411 68.9 kg (152 lb)     Height 10/07/16 1411 1.727 m (5\' 8" )     Head Circumference --      Peak Flow --      Pain Score 10/07/16 1410 9     Pain Loc --      Pain Edu? --      Excl. in GC? --     Constitutional: Alert and oriented. No acute distress but does appear anxious Eyes: Conjunctivae are normal.  Head: Atraumatic. Nose: No congestion/rhinnorhea. Mouth/Throat: Mucous membranes are moist. Neck: No stridor.  No meningeal signs.   Cardiovascular: Moderate tachycardia, regular rhythm. Good peripheral circulation. Grossly normal heart sounds. Respiratory: Normal respiratory effort.  No retractions. Lungs CTAB. Gastrointestinal: Soft and nontender. No distention.  Musculoskeletal: No lower extremity tenderness nor edema. No gross deformities of extremities. Neurologic:  Normal speech and language. No gross focal neurologic deficits are appreciated.  Skin:  Skin is warm, dry and intact. No rash noted. Psychiatric: Mood and affect are normal. Speech  and behavior are normal.  ____________________________________________   LABS (all labs ordered are listed, but only abnormal results are displayed)  Labs Reviewed  BASIC METABOLIC PANEL - Abnormal; Notable for the following:       Result Value   Chloride 100 (*)    Glucose, Bld 258 (*)    All other components within normal limits  CBC - Abnormal; Notable for the following:    WBC 11.2 (*)    MCHC 36.1 (*)    All other components within normal limits  HEPATIC FUNCTION PANEL - Abnormal; Notable for the following:    Total Protein 8.4 (*)    All other components within normal limits  URINALYSIS, ROUTINE W REFLEX MICROSCOPIC - Abnormal; Notable for the following:    Color, Urine YELLOW (*)    APPearance CLEAR (*)  Specific Gravity, Urine 1.036 (*)    Glucose, UA >=500 (*)    All other components within normal limits  TROPONIN I  MAGNESIUM  T4, FREE  T3, FREE  TSH   ____________________________________________  EKG  ED ECG REPORT I, Wilfredo Canterbury, the attending physician, personally viewed and interpreted this ECG.  Date: 10/07/2016 EKG Time: 14:05 Rate: 135 Rhythm: Moderate sinus tachycardia QRS Axis: normal Intervals: normal ST/T Wave abnormalities: Non-specific ST segment / T-wave changes, but no evidence of acute ischemia. Conduction Disturbances: none Narrative Interpretation: Sinus tachycardia without evidence of acute ischemia  ____________________________________________  RADIOLOGY   Dg Chest 2 View  Result Date: 10/07/2016 CLINICAL DATA:  Chest pain EXAM: CHEST  2 VIEW COMPARISON:  04/20/2016 FINDINGS: The heart size and mediastinal contours are within normal limits. Both lungs are clear. The visualized skeletal structures are unremarkable. IMPRESSION: No active cardiopulmonary disease. Electronically Signed   By: Alcide Clever M.D.   On: 10/07/2016 14:38   Ct Chest W Contrast  Result Date: 10/07/2016 CLINICAL DATA:  RIGHT-sided chest pressure  radiating to RIGHT upper quadrant. EXAM: CT CHEST, ABDOMEN, AND PELVIS WITH CONTRAST TECHNIQUE: Multidetector CT imaging of the chest, abdomen and pelvis was performed following the standard protocol during bolus administration of intravenous contrast. CONTRAST:  ISOVUE-300 IOPAMIDOL (ISOVUE-300) INJECTION 61% COMPARISON:  Radiograph 10/07/2016 FINDINGS: CT CHEST FINDINGS Cardiovascular: No significant vascular findings. Normal heart size. No pericardial effusion. Mediastinum/Nodes: No axillary supraclavicular adenopathy. No mediastinal hilar adenopathy. No pericardial fluid. Esophagus. Ill-defined nodule in the RIGHT lobe of thyroid gland measures approximately 2 cm x 3 cm. Lungs/Pleura: No suspicious pulmonary nodules.  Airways normal. Musculoskeletal: No aggressive osseous lesion. CT ABDOMEN AND PELVIS FINDINGS Hepatobiliary: No focal hepatic lesion. No biliary ductal dilatation. Gallbladder is normal. Common bile duct is normal. Pancreas: Pancreas is normal. No ductal dilatation. No pancreatic inflammation. Spleen: Normal spleen Adrenals/urinary tract: Adrenal glands normal. Kidneys, ureters and bladder normal. Stomach/Bowel: Stomach, duodenum and small bowel normal. Appendix not identified. Colon rectosigmoid colon normal. No inflammation or obstruction. Vascular/Lymphatic: Abdominal aorta is normal caliber with atherosclerotic calcification. There is no retroperitoneal or periportal lymphadenopathy. No pelvic lymphadenopathy. Reproductive: All prostate normal Other: No free fluid. Musculoskeletal: No aggressive osseous lesion. IMPRESSION: Chest Impression: 1. No chest abnormality. 2. No explanation RIGHT-sided chest pain. 3. RIGHT thyroid nodule. Recommend thyroid ultrasound for further characterization. Abdomen / Pelvis Impression: 1. No acute findings in the abdomen pelvis. 2.  Atherosclerotic calcification of the aorta. Electronically Signed   By: Genevive Bi M.D.   On: 10/07/2016 16:43   Ct  Abdomen Pelvis W Contrast  Result Date: 10/07/2016 CLINICAL DATA:  RIGHT-sided chest pressure radiating to RIGHT upper quadrant. EXAM: CT CHEST, ABDOMEN, AND PELVIS WITH CONTRAST TECHNIQUE: Multidetector CT imaging of the chest, abdomen and pelvis was performed following the standard protocol during bolus administration of intravenous contrast. CONTRAST:  ISOVUE-300 IOPAMIDOL (ISOVUE-300) INJECTION 61% COMPARISON:  Radiograph 10/07/2016 FINDINGS: CT CHEST FINDINGS Cardiovascular: No significant vascular findings. Normal heart size. No pericardial effusion. Mediastinum/Nodes: No axillary supraclavicular adenopathy. No mediastinal hilar adenopathy. No pericardial fluid. Esophagus. Ill-defined nodule in the RIGHT lobe of thyroid gland measures approximately 2 cm x 3 cm. Lungs/Pleura: No suspicious pulmonary nodules.  Airways normal. Musculoskeletal: No aggressive osseous lesion. CT ABDOMEN AND PELVIS FINDINGS Hepatobiliary: No focal hepatic lesion. No biliary ductal dilatation. Gallbladder is normal. Common bile duct is normal. Pancreas: Pancreas is normal. No ductal dilatation. No pancreatic inflammation. Spleen: Normal spleen Adrenals/urinary tract: Adrenal glands normal.  Kidneys, ureters and bladder normal. Stomach/Bowel: Stomach, duodenum and small bowel normal. Appendix not identified. Colon rectosigmoid colon normal. No inflammation or obstruction. Vascular/Lymphatic: Abdominal aorta is normal caliber with atherosclerotic calcification. There is no retroperitoneal or periportal lymphadenopathy. No pelvic lymphadenopathy. Reproductive: All prostate normal Other: No free fluid. Musculoskeletal: No aggressive osseous lesion. IMPRESSION: Chest Impression: 1. No chest abnormality. 2. No explanation RIGHT-sided chest pain. 3. RIGHT thyroid nodule. Recommend thyroid ultrasound for further characterization. Abdomen / Pelvis Impression: 1. No acute findings in the abdomen pelvis. 2.  Atherosclerotic calcification  of the aorta. Electronically Signed   By: Genevive Bi M.D.   On: 10/07/2016 16:43    ____________________________________________   PROCEDURES  Critical Care performed: No   Procedure(s) performed:   Procedures   ____________________________________________   INITIAL IMPRESSION / ASSESSMENT AND PLAN / ED COURSE  Pertinent labs & imaging results that were available during my care of the patient were reviewed by me and considered in my medical decision making (see chart for details).  The patient has had similar symptoms for an extended period of time and has had a brought outpatient and emergency department workup.  His initial heart rate in the 130s is concerning but he is not short of breath and I suspect anxiety may be playing a role.  However, given his complaints of chest discomfort, abdominal pain, and "organ swelling", I will evaluate with CT chest IV contrast and CT abdomen with oral and IV contrast to look for any acute or emergent medical condition.  He already knows about a nodule on his thyroid for which she has had an outpatient ultrasound.  I am giving 1 L normal saline to see if this will help improve his tachycardia.  He has not been having any nausea nor vomiting.  I explained to the patient and his wife that with symptoms for many months with an extensive workup as an outpatient, it is unlikely I will be able to identify the cause of all of these long-term and chronic symptoms, but my job today will be to rule out acute or emergent medical conditions that will require him to stay in the hospital.  They both understand and agree with the plan.   Clinical Course as of Oct 07 1737  Wed Oct 07, 2016  1738 CT scans were reassuring with no evidence of any acute abnormalities.  I updated the patient and his wife.  His heart rate has consistently been around 100 after the IV fluids and I find no acute or emergent medical reason to admit him to the hospital.  He says that  he will follow-up with his primary care provider at the next available opportunity.  I gave my usual and customary return precautions.     [CF]    Clinical Course User Index [CF] Loleta Rose, MD    ____________________________________________  FINAL CLINICAL IMPRESSION(S) / ED DIAGNOSES  Final diagnoses:  Atypical chest pain  Generalized abdominal pain     MEDICATIONS GIVEN DURING THIS VISIT:  Medications  iopamidol (ISOVUE-300) 61 % injection 30 mL (30 mLs Oral Contrast Given 10/07/16 1544)  ondansetron (ZOFRAN) injection 4 mg ( Intravenous Given 10/07/16 1556)  iopamidol (ISOVUE-300) 61 % injection 100 mL (100 mLs Intravenous Contrast Given 10/07/16 1621)     NEW OUTPATIENT MEDICATIONS STARTED DURING THIS VISIT:  New Prescriptions   No medications on file    Modified Medications   No medications on file    Discontinued Medications   NAPROXEN (NAPROSYN)  500 MG TABLET    Take 500 mg by mouth 2 (two) times daily with a meal.   VALACYCLOVIR (VALTREX) 1000 MG TABLET    Take 1 tablet (1,000 mg total) by mouth 3 (three) times daily.     Note:  This document was prepared using Dragon voice recognition software and may include unintentional dictation errors.    Loleta Rose, MD 10/07/16 4098    Loleta Rose, MD 10/07/16 1739

## 2016-10-13 DIAGNOSIS — R0600 Dyspnea, unspecified: Secondary | ICD-10-CM | POA: Insufficient documentation

## 2016-10-13 DIAGNOSIS — R6881 Early satiety: Secondary | ICD-10-CM | POA: Insufficient documentation

## 2016-10-13 DIAGNOSIS — R634 Abnormal weight loss: Secondary | ICD-10-CM | POA: Insufficient documentation

## 2016-10-13 DIAGNOSIS — R1013 Epigastric pain: Secondary | ICD-10-CM | POA: Insufficient documentation

## 2016-10-13 DIAGNOSIS — R0609 Other forms of dyspnea: Secondary | ICD-10-CM

## 2016-10-15 ENCOUNTER — Emergency Department: Payer: 59

## 2016-10-15 ENCOUNTER — Emergency Department
Admission: EM | Admit: 2016-10-15 | Discharge: 2016-10-15 | Disposition: A | Discharge status: Home / Self Care | Payer: 59 | Attending: Emergency Medicine | Admitting: Emergency Medicine

## 2016-10-15 ENCOUNTER — Other Ambulatory Visit: Payer: Self-pay

## 2016-10-15 DIAGNOSIS — R634 Abnormal weight loss: Secondary | ICD-10-CM | POA: Diagnosis not present

## 2016-10-15 DIAGNOSIS — R197 Diarrhea, unspecified: Secondary | ICD-10-CM

## 2016-10-15 DIAGNOSIS — R42 Dizziness and giddiness: Secondary | ICD-10-CM | POA: Diagnosis present

## 2016-10-15 DIAGNOSIS — Z79899 Other long term (current) drug therapy: Secondary | ICD-10-CM | POA: Insufficient documentation

## 2016-10-15 DIAGNOSIS — Z7982 Long term (current) use of aspirin: Secondary | ICD-10-CM | POA: Diagnosis not present

## 2016-10-15 DIAGNOSIS — R1013 Epigastric pain: Secondary | ICD-10-CM

## 2016-10-15 DIAGNOSIS — E119 Type 2 diabetes mellitus without complications: Secondary | ICD-10-CM | POA: Insufficient documentation

## 2016-10-15 DIAGNOSIS — E860 Dehydration: Secondary | ICD-10-CM | POA: Diagnosis not present

## 2016-10-15 DIAGNOSIS — Z7984 Long term (current) use of oral hypoglycemic drugs: Secondary | ICD-10-CM | POA: Insufficient documentation

## 2016-10-15 LAB — CBC
HCT: 44.6 % (ref 40.0–52.0)
Hemoglobin: 15.8 g/dL (ref 13.0–18.0)
MCH: 31 pg (ref 26.0–34.0)
MCHC: 35.4 g/dL (ref 32.0–36.0)
MCV: 87.8 fL (ref 80.0–100.0)
Platelets: 290 10*3/uL (ref 150–440)
RBC: 5.08 MIL/uL (ref 4.40–5.90)
RDW: 13 % (ref 11.5–14.5)
WBC: 10.8 10*3/uL — ABNORMAL HIGH (ref 3.8–10.6)

## 2016-10-15 LAB — URINALYSIS, COMPLETE (UACMP) WITH MICROSCOPIC
Bacteria, UA: NONE SEEN
Bilirubin Urine: NEGATIVE
Glucose, UA: >=500 mg/dL — AB
Hgb urine dipstick: NEGATIVE
Ketones, ur: 5 mg/dL — AB
Leukocytes, UA: NEGATIVE
Nitrite: NEGATIVE
Protein, ur: NEGATIVE mg/dL
Specific Gravity, Urine: 1.02 (ref 1.005–1.030)
Squamous Epithelial / LPF: NONE SEEN
pH: 5 (ref 5.0–8.0)

## 2016-10-15 LAB — COMPREHENSIVE METABOLIC PANEL
ALT: 33 U/L (ref 17–63)
AST: 26 U/L (ref 15–41)
Albumin: 5 g/dL (ref 3.5–5.0)
Alkaline Phosphatase: 71 U/L (ref 38–126)
Anion gap: 11 (ref 5–15)
BUN: 9 mg/dL (ref 6–20)
CO2: 29 mmol/L (ref 22–32)
Calcium: 9.9 mg/dL (ref 8.9–10.3)
Chloride: 94 mmol/L — ABNORMAL LOW (ref 101–111)
Creatinine, Ser: 0.85 mg/dL (ref 0.61–1.24)
GFR calc Af Amer: >60 mL/min (ref >60)
GFR calc non Af Amer: >60 mL/min (ref >60)
Glucose, Bld: 234 mg/dL — ABNORMAL HIGH (ref 65–99)
Potassium: 3.2 mmol/L — ABNORMAL LOW (ref 3.5–5.1)
Sodium: 134 mmol/L — ABNORMAL LOW (ref 135–145)
Total Bilirubin: 1 mg/dL (ref 0.3–1.2)
Total Protein: 9 g/dL — ABNORMAL HIGH (ref 6.5–8.1)

## 2016-10-15 LAB — TROPONIN I: Troponin I: <0.03 ng/mL (ref <0.03)

## 2016-10-15 LAB — LIPASE, BLOOD: Lipase: 41 U/L (ref 11–51)

## 2016-10-15 LAB — LACTIC ACID, PLASMA: Lactic Acid, Venous: 1.6 mmol/L (ref 0.5–1.9)

## 2016-10-15 MED ORDER — SODIUM CHLORIDE 0.9 % IV BOLUS (SEPSIS)
1000.0000 mL | Freq: Once | INTRAVENOUS | Status: AC
Start: 2016-10-15 — End: 2016-10-15
  Administered 2016-10-15: 1000 mL via INTRAVENOUS

## 2016-10-15 MED ORDER — IOPAMIDOL (ISOVUE-370) INJECTION 76%
100.0000 mL | Freq: Once | INTRAVENOUS | Status: AC | PRN
  Administered 2016-10-15: 100 mL via INTRAVENOUS

## 2016-10-15 MED ORDER — SODIUM CHLORIDE 0.9 % IV BOLUS (SEPSIS)
1000.0000 mL | Freq: Once | INTRAVENOUS | Status: AC
  Administered 2016-10-15: 1000 mL via INTRAVENOUS

## 2016-10-15 MED ORDER — ACETAMINOPHEN 500 MG PO TABS
1000.0000 mg | ORAL_TABLET | Freq: Once | ORAL | Status: AC
  Administered 2016-10-15: 1000 mg via ORAL
  Filled 2016-10-15: qty 2

## 2016-10-15 NOTE — ED Triage Notes (Addendum)
Pt went to cardiologist and was told that he needed to come to the er for fluids - the cardiologist reported that he had a elevated pulse - pt reports nausea but denies vomiting and that he has been unable to eat and drink for the last month - he reports that he feels "full" all the time - pt was seen last Wed for dizziness and nausea and he still has dizziness - pt has lost 30 lbs in the last 3 months

## 2016-10-15 NOTE — ED Provider Notes (Signed)
[facility] Emergency Department Provider Note  ____________________________________________  Time seen: Approximately 2:58 PM  I have reviewed the triage vital signs and the nursing notes.   HISTORY  Chief Complaint Dizziness; Nausea; Joint Pain; and Abdominal Pain   HPI Jeffrey Velazquez is a 56 y.o. male with a history of shingles and diabetes who presents for evaluation of tachycardia and dizziness. According to patient he has had 6 months of abdominal pain, early satiety, 30 pound weight loss. He was seen here a week ago with negative CT scans of abdomen and chest. He was referred to a GI specialist who he saw a couple days ago. The GI specialist sent him over to cardiology today to have him cleared for endoscopy and colonoscopy. After seeing the cardiologist patient came to the emergency room due to concerns for dehydration. His sister tells me that on top of his chronic symptoms that he has had several daily episodes of watery diarrhea this week and they noted that he was tachycardic at the cardiologist's office. No h/o C. Diff or recent abx. Patient is also complaining of diffuse joint aches which he has had on and off for the last 6 months. He is also complaining of lightheadedness worse when he stands up.  He describes abdominal pain as muscle soreness, severe, located mostly in the epigastric region, worse every time he eats. Patient has never had abdominal surgeries in the past. At this time his pain is mild. No melena, no hematemesis, no hematochezia. No fever or chills, no urinary symptoms.  Past Medical History:  Diagnosis Date  . Diabetes mellitus without complication (HCC)   . Shingles     There are no active problems to display for this patient.   Past Surgical History:  Procedure Laterality Date  . FRACTURE SURGERY Left 2010   Clavicle  . TONSILLECTOMY      Prior to Admission medications   Medication Sig Start Date End Date Taking?  Authorizing Provider  aspirin EC 81 MG tablet Take 81 mg by mouth daily.   Yes [provider]  atorvastatin (LIPITOR) 20 MG tablet Take 20 mg by mouth daily.   Yes [provider]  carbamazepine (TEGRETOL) 200 MG tablet Take 200 mg by mouth 2 (two) times daily. 09/30/16  Yes [provider]  cholecalciferol (VITAMIN D) 1000 units tablet Take 5,000 Units by mouth daily.    Yes [provider]  folic acid (FOLVITE) 1 MG tablet Take 1 tablet by mouth daily.   Yes [provider]  gabapentin (NEURONTIN) 300 MG capsule Take 600-900 mg by mouth 3 (three) times daily. Take 600 mg every morning and lunch and 900 mg at bedtime.   Yes [provider]  glimepiride (AMARYL) 2 MG tablet Take 2 mg by mouth 2 (two) times daily.   Yes [provider]  hydrOXYzine (ATARAX/VISTARIL) 25 MG tablet Take 25 mg by mouth every 8 (eight) hours as needed.   Yes [provider]  Melatonin 3 MG TABS Take 9 mg by mouth at bedtime.   Yes [provider]  metFORMIN (GLUCOPHAGE-XR) 500 MG 24 hr tablet Take 500 mg by mouth 2 (two) times daily. 10/15/16  Yes [provider]  Multiple Vitamin (MULTI-VITAMINS) TABS Take 1 tablet by mouth daily.   Yes [provider]  nortriptyline (PAMELOR) 10 MG capsule Take 20 mg by mouth daily. 10/01/16  Yes [provider]  Omega-3 Fatty Acids (FISH OIL PO) Take 1 capsule  by mouth 2 (two) times daily.   Yes [provider]  omeprazole (PRILOSEC) 20 MG capsule Take 20 mg by mouth daily. 10/13/16  Yes [provider]  VIAGRA 50 MG tablet Take 50 mg by mouth daily as needed for erectile dysfunction. 09/25/16  Yes [provider]  vitamin B-12 (CYANOCOBALAMIN) 100 MCG tablet Take 100 mcg by mouth daily.   Yes [provider]  bismuth subsalicylate (PEPTO BISMOL) 262 MG chewable tablet Chew 262 mg by mouth daily as needed.    [provider]  metFORMIN  (GLUCOPHAGE) 500 MG tablet Take 1 tablet (500 mg total) by mouth daily with breakfast. Patient not taking: Reported on 10/15/2016 04/26/16 04/26/17  Myrna BlazerSchaevitz, David Matthew, MD  traMADol (ULTRAM) 50 MG tablet Take 1 tablet (50 mg total) by mouth every 6 (six) hours as needed. Patient not taking: Reported on 10/07/2016 04/20/16 04/20/17  Phineas SemenGoodman, Graydon, MD    Allergies Codeine  No family history on file.  Social History Social History  Substance Use Topics  . Smoking status: Never Smoker  . Smokeless tobacco: Never Used  . Alcohol use No    Review of Systems  Constitutional: Negative for fever. Eyes: Negative for visual changes. ENT: Negative for sore throat. Neck: No neck pain  Cardiovascular: Negative for chest pain. Respiratory: Negative for shortness of breath. Gastrointestinal: + epigastric abdominal pain, early satiety, weight loss, nausea. Genitourinary: Negative for dysuria. Musculoskeletal: Negative for back pain. + diffuse joint pain Skin: Negative for rash. Neurological: Negative for headaches, weakness or numbness. Psych: No SI or HI  ____________________________________________   PHYSICAL EXAM:  VITAL SIGNS: ED Triage Vitals  Enc Vitals Group     BP 10/15/16 1227 (!) 137/92     Pulse Rate 10/15/16 1227 (!) 134     Resp 10/15/16 1227 18     Temp 10/15/16 1227 98.5 F (36.9 C)     Temp Source 10/15/16 1227 Oral     SpO2 10/15/16 1227 100 %     Weight 10/15/16 1227 152 lb (68.9 kg)     Height 10/15/16 1227 5\' 8"  (1.727 m)     Head Circumference --      Peak Flow --      Pain Score 10/15/16 1226 10     Pain Loc --      Pain Edu? --      Excl. in GC? --     Constitutional: Alert and oriented. Well appearing and in no apparent distress. HEENT:      Head: Normocephalic and atraumatic.         Eyes: Conjunctivae are normal. Sclera is non-icteric.       Mouth/Throat: Mucous membranes are moist.       Neck: Supple with no signs of  meningismus. Cardiovascular: Tachycardic with regular rhythm. No murmurs, gallops, or rubs. 2+ symmetrical distal pulses are present in all extremities. No JVD. Respiratory: Normal respiratory effort. Lungs are clear to auscultation bilaterally. No wheezes, crackles, or rhonchi.  Gastrointestinal: Soft, mild diffuse ttp, non distended with positive bowel sounds. No rebound or guarding. Genitourinary: No CVA tenderness. Musculoskeletal: Nontender with normal range of motion in all extremities. No edema, cyanosis, or erythema of extremities. Neurologic: Normal speech and language. Face is symmetric. Moving all extremities. No gross focal neurologic deficits are appreciated. Skin: Skin is warm, dry and intact. No rash noted. Psychiatric: Mood and affect are normal. Speech and behavior are normal.  ____________________________________________   LABS (all labs ordered are listed,  but only abnormal results are displayed)  Labs Reviewed  COMPREHENSIVE METABOLIC PANEL - Abnormal; Notable for the following:       Result Value   Sodium 134 (*)    Potassium 3.2 (*)    Chloride 94 (*)    Glucose, Bld 234 (*)    Total Protein 9.0 (*)    All other components within normal limits  CBC - Abnormal; Notable for the following:    WBC 10.8 (*)    All other components within normal limits  URINALYSIS, COMPLETE (UACMP) WITH MICROSCOPIC - Abnormal; Notable for the following:    Color, Urine YELLOW (*)    APPearance CLEAR (*)    Glucose, UA >=500 (*)    Ketones, ur 5 (*)    All other components within normal limits  LIPASE, BLOOD  TROPONIN I  LACTIC ACID, PLASMA   ____________________________________________  EKG  ED ECG REPORT I, Nita Sickle, the attending physician, personally viewed and interpreted this ECG.  Sinus tachycardia, rate of 108, normal intervals, normal axis, no ST elevations or depressions.  ____________________________________________  RADIOLOGY  CT angio a/p:  No  acute CT finding.  Minimal aortic atherosclerosis.  Diverticular disease without evidence of acute diverticulitis. ____________________________________________   PROCEDURES  Procedure(s) performed: None Procedures Critical Care performed:  None ____________________________________________   INITIAL IMPRESSION / ASSESSMENT AND PLAN / ED COURSE  56 y.o. male with a history of shingles and diabetes who presents for evaluation of tachycardia and dizziness in the setting of 6 months of abdominal pain, early satiety, weight loss, and now with one week of diarrhea. Patient looks dehydrated on exam as is dry and tachycardic. We'll give IV fluids. We'll check basic blood work. Patient underwent CT chest and abdomen pelvis a week ago with no acute findings. His aorta on CT abdomen and pelvis did show calcifications and it seems like patient's symptoms are worse postprandially which raises the suspicion of mesenteric ischemia. Lactate is pending.  Clinical Course as of Oct 16 1839  Thu Oct 15, 2016  7829 Patient feels markedly improved after 2 L of fluid. Tachycardia has resolved. CT angiogram of his abdomen with no evidence of mesenteric ischemia. Patient be discharged home with close follow-up with his cardiologist and her GI doctor for further evaluation of his unintentional weight loss.  [CV]    Clinical Course User Index [CV] Nita Sickle, MD    Pertinent labs & imaging results that were available during my care of the patient were reviewed by me and considered in my medical decision making (see chart for details).    ____________________________________________   FINAL CLINICAL IMPRESSION(S) / ED DIAGNOSES  Final diagnoses:  Dehydration  Diarrhea, unspecified type  Epigastric pain  Unintentional weight loss      NEW MEDICATIONS STARTED DURING THIS VISIT:  New Prescriptions   No medications on file     Note:  This document was prepared using Dragon voice  recognition software and may include unintentional dictation errors.    Nita Sickle, MD 10/15/16 [phone]

## 2016-10-15 NOTE — Discharge Instructions (Signed)
Follow-up with your cardiologist and GI doctor for further management and evaluation of your symptoms. Return to the emergency room for new or worsening abdominal pain, chest pain, or any new symptoms concerning to you.

## 2016-10-26 DIAGNOSIS — R197 Diarrhea, unspecified: Secondary | ICD-10-CM | POA: Insufficient documentation

## 2016-10-29 ENCOUNTER — Encounter: Payer: Self-pay | Admitting: Emergency Medicine

## 2016-10-29 ENCOUNTER — Observation Stay
Admission: EM | Admit: 2016-10-29 | Discharge: 2016-10-30 | Disposition: A | Discharge status: Home / Self Care | Payer: 59 | Attending: Internal Medicine | Admitting: Internal Medicine

## 2016-10-29 ENCOUNTER — Emergency Department: Payer: 59

## 2016-10-29 DIAGNOSIS — Z7984 Long term (current) use of oral hypoglycemic drugs: Secondary | ICD-10-CM | POA: Diagnosis not present

## 2016-10-29 DIAGNOSIS — R634 Abnormal weight loss: Secondary | ICD-10-CM | POA: Insufficient documentation

## 2016-10-29 DIAGNOSIS — R109 Unspecified abdominal pain: Secondary | ICD-10-CM | POA: Diagnosis present

## 2016-10-29 DIAGNOSIS — R079 Chest pain, unspecified: Principal | ICD-10-CM | POA: Insufficient documentation

## 2016-10-29 DIAGNOSIS — R000 Tachycardia, unspecified: Secondary | ICD-10-CM | POA: Diagnosis not present

## 2016-10-29 DIAGNOSIS — R1010 Upper abdominal pain, unspecified: Secondary | ICD-10-CM | POA: Diagnosis not present

## 2016-10-29 DIAGNOSIS — E119 Type 2 diabetes mellitus without complications: Secondary | ICD-10-CM | POA: Insufficient documentation

## 2016-10-29 DIAGNOSIS — Z6822 Body mass index (BMI) 22.0-22.9, adult: Secondary | ICD-10-CM | POA: Diagnosis not present

## 2016-10-29 DIAGNOSIS — Z7982 Long term (current) use of aspirin: Secondary | ICD-10-CM | POA: Diagnosis not present

## 2016-10-29 DIAGNOSIS — Z79899 Other long term (current) drug therapy: Secondary | ICD-10-CM | POA: Diagnosis not present

## 2016-10-29 DIAGNOSIS — R627 Adult failure to thrive: Secondary | ICD-10-CM | POA: Insufficient documentation

## 2016-10-29 DIAGNOSIS — E876 Hypokalemia: Secondary | ICD-10-CM | POA: Insufficient documentation

## 2016-10-29 DIAGNOSIS — E871 Hypo-osmolality and hyponatremia: Secondary | ICD-10-CM

## 2016-10-29 LAB — CREATININE, SERUM
Creatinine, Ser: 0.73 mg/dL (ref 0.61–1.24)
GFR calc Af Amer: >60 mL/min (ref >60)
GFR calc non Af Amer: >60 mL/min (ref >60)

## 2016-10-29 LAB — CBC
HCT: 42.2 % (ref 40.0–52.0)
HCT: 40.2 % (ref 40.0–52.0)
Hemoglobin: 15.2 g/dL (ref 13.0–18.0)
Hemoglobin: 14.4 g/dL (ref 13.0–18.0)
MCH: 31.3 pg (ref 26.0–34.0)
MCH: 31.6 pg (ref 26.0–34.0)
MCHC: 36 g/dL (ref 32.0–36.0)
MCHC: 35.9 g/dL (ref 32.0–36.0)
MCV: 86.9 fL (ref 80.0–100.0)
MCV: 88.1 fL (ref 80.0–100.0)
Platelets: 298 10*3/uL (ref 150–440)
Platelets: 237 10*3/uL (ref 150–440)
RBC: 4.86 MIL/uL (ref 4.40–5.90)
RBC: 4.56 MIL/uL (ref 4.40–5.90)
RDW: 13.7 % (ref 11.5–14.5)
RDW: 13.5 % (ref 11.5–14.5)
WBC: 12.1 10*3/uL — ABNORMAL HIGH (ref 3.8–10.6)
WBC: 10.1 10*3/uL (ref 3.8–10.6)

## 2016-10-29 LAB — URINALYSIS, COMPLETE (UACMP) WITH MICROSCOPIC
Bacteria, UA: NONE SEEN
Bilirubin Urine: NEGATIVE
Glucose, UA: >=500 mg/dL — AB
Hgb urine dipstick: NEGATIVE
Ketones, ur: 80 mg/dL — AB
Leukocytes, UA: NEGATIVE
Nitrite: NEGATIVE
Protein, ur: 30 mg/dL — AB
RBC / HPF: NONE SEEN RBC/hpf (ref 0–5)
Specific Gravity, Urine: 1.023 (ref 1.005–1.030)
pH: 5 (ref 5.0–8.0)

## 2016-10-29 LAB — URINE DRUG SCREEN, QUALITATIVE (ARMC ONLY)
Amphetamines, Ur Screen: NOT DETECTED
Barbiturates, Ur Screen: NOT DETECTED
Benzodiazepine, Ur Scrn: NOT DETECTED
Cannabinoid 50 Ng, Ur ~~LOC~~: NOT DETECTED
Cocaine Metabolite,Ur ~~LOC~~: NOT DETECTED
MDMA (Ecstasy)Ur Screen: NOT DETECTED
Methadone Scn, Ur: NOT DETECTED
Opiate, Ur Screen: NOT DETECTED
Phencyclidine (PCP) Ur S: NOT DETECTED
Tricyclic, Ur Screen: POSITIVE — AB

## 2016-10-29 LAB — BASIC METABOLIC PANEL
Anion gap: 12 (ref 5–15)
BUN: 10 mg/dL (ref 6–20)
CO2: 25 mmol/L (ref 22–32)
Calcium: 10.2 mg/dL (ref 8.9–10.3)
Chloride: 97 mmol/L — ABNORMAL LOW (ref 101–111)
Creatinine, Ser: 0.82 mg/dL (ref 0.61–1.24)
GFR calc Af Amer: >60 mL/min (ref >60)
GFR calc non Af Amer: >60 mL/min (ref >60)
Glucose, Bld: 189 mg/dL — ABNORMAL HIGH (ref 65–99)
Potassium: 3.4 mmol/L — ABNORMAL LOW (ref 3.5–5.1)
Sodium: 134 mmol/L — ABNORMAL LOW (ref 135–145)

## 2016-10-29 LAB — HEPATIC FUNCTION PANEL
ALT: 27 U/L (ref 17–63)
AST: 27 U/L (ref 15–41)
Albumin: 5.1 g/dL — ABNORMAL HIGH (ref 3.5–5.0)
Alkaline Phosphatase: 69 U/L (ref 38–126)
Bilirubin, Direct: 0.2 mg/dL (ref 0.1–0.5)
Indirect Bilirubin: 1.2 mg/dL — ABNORMAL HIGH (ref 0.3–0.9)
Total Bilirubin: 1.4 mg/dL — ABNORMAL HIGH (ref 0.3–1.2)
Total Protein: 9 g/dL — ABNORMAL HIGH (ref 6.5–8.1)

## 2016-10-29 LAB — TSH
TSH: 1.318 u[IU]/mL (ref 0.350–4.500)
TSH: 1.522 u[IU]/mL (ref 0.350–4.500)

## 2016-10-29 LAB — GLUCOSE, CAPILLARY: Glucose-Capillary: 128 mg/dL — ABNORMAL HIGH (ref 65–99)

## 2016-10-29 LAB — MAGNESIUM: Magnesium: 1.8 mg/dL (ref 1.7–2.4)

## 2016-10-29 LAB — TROPONIN I
Troponin I: <0.03 ng/mL (ref <0.03)
Troponin I: <0.03 ng/mL (ref <0.03)

## 2016-10-29 LAB — LIPASE, BLOOD: Lipase: 34 U/L (ref 11–51)

## 2016-10-29 MED ORDER — OMEGA-3-ACID ETHYL ESTERS 1 G PO CAPS
1.0000 g | ORAL_CAPSULE | Freq: Every day | ORAL | Status: DC
  Administered 2016-10-29: 1 g via ORAL
  Filled 2016-10-29 (×2): qty 1

## 2016-10-29 MED ORDER — GLIMEPIRIDE 2 MG PO TABS
2.0000 mg | ORAL_TABLET | Freq: Two times a day (BID) | ORAL | Status: DC
  Administered 2016-10-30: 2 mg via ORAL
  Filled 2016-10-29: qty 1

## 2016-10-29 MED ORDER — NITROGLYCERIN 2 % TD OINT
0.5000 [in_us] | TOPICAL_OINTMENT | Freq: Three times a day (TID) | TRANSDERMAL | Status: DC
  Administered 2016-10-29 – 2016-10-30 (×2): 0.5 [in_us] via TOPICAL
  Filled 2016-10-29 (×2): qty 1

## 2016-10-29 MED ORDER — ACETAMINOPHEN 325 MG PO TABS: 650.0000 mg | ORAL_TABLET | Freq: Four times a day (QID) | ORAL | Status: DC | PRN

## 2016-10-29 MED ORDER — ONDANSETRON HCL 4 MG/2ML IJ SOLN
4.0000 mg | INTRAMUSCULAR | Status: DC | PRN
  Administered 2016-10-29: 4 mg via INTRAVENOUS
  Filled 2016-10-29: qty 2

## 2016-10-29 MED ORDER — PANTOPRAZOLE SODIUM 40 MG PO TBEC
40.0000 mg | DELAYED_RELEASE_TABLET | Freq: Every day | ORAL | Status: DC
  Administered 2016-10-29: 40 mg via ORAL
  Filled 2016-10-29 (×2): qty 1

## 2016-10-29 MED ORDER — METFORMIN HCL ER 500 MG PO TB24
500.0000 mg | ORAL_TABLET | Freq: Two times a day (BID) | ORAL | Status: DC
  Administered 2016-10-30: 500 mg via ORAL
  Filled 2016-10-29 (×2): qty 1

## 2016-10-29 MED ORDER — MELATONIN 5 MG PO TABS
10.0000 mg | ORAL_TABLET | Freq: Every day | ORAL | Status: DC
  Administered 2016-10-29: 10 mg via ORAL
  Filled 2016-10-29 (×2): qty 2

## 2016-10-29 MED ORDER — VITAMIN B-12 100 MCG PO TABS
100.0000 ug | ORAL_TABLET | Freq: Every day | ORAL | Status: DC
  Administered 2016-10-29: 100 ug via ORAL
  Filled 2016-10-29 (×3): qty 1

## 2016-10-29 MED ORDER — GABAPENTIN 300 MG PO CAPS
900.0000 mg | ORAL_CAPSULE | Freq: Every day | ORAL | Status: DC
  Administered 2016-10-30: 900 mg via ORAL
  Filled 2016-10-29: qty 3

## 2016-10-29 MED ORDER — ATORVASTATIN CALCIUM 20 MG PO TABS
20.0000 mg | ORAL_TABLET | Freq: Every day | ORAL | Status: DC
  Administered 2016-10-29: 20 mg via ORAL
  Filled 2016-10-29 (×2): qty 1

## 2016-10-29 MED ORDER — ONDANSETRON HCL 4 MG/2ML IJ SOLN: 4.0000 mg | Freq: Four times a day (QID) | INTRAMUSCULAR | Status: DC | PRN

## 2016-10-29 MED ORDER — GABAPENTIN 300 MG PO CAPS
600.0000 mg | ORAL_CAPSULE | Freq: Two times a day (BID) | ORAL | Status: DC
  Administered 2016-10-30: 600 mg via ORAL
  Filled 2016-10-29: qty 2

## 2016-10-29 MED ORDER — METOPROLOL TARTRATE 25 MG PO TABS
25.0000 mg | ORAL_TABLET | Freq: Two times a day (BID) | ORAL | Status: DC
  Administered 2016-10-30: 25 mg via ORAL
  Filled 2016-10-29: qty 1

## 2016-10-29 MED ORDER — ACETAMINOPHEN 650 MG RE SUPP: 650.0000 mg | Freq: Four times a day (QID) | RECTAL | Status: DC | PRN

## 2016-10-29 MED ORDER — ASPIRIN EC 81 MG PO TBEC: 81.0000 mg | DELAYED_RELEASE_TABLET | Freq: Every day | ORAL | Status: DC

## 2016-10-29 MED ORDER — POTASSIUM CHLORIDE IN NACL 20-0.9 MEQ/L-% IV SOLN
INTRAVENOUS | Status: DC
  Administered 2016-10-29 – 2016-10-30 (×2): via INTRAVENOUS
  Filled 2016-10-29 (×4): qty 1000

## 2016-10-29 MED ORDER — ONDANSETRON HCL 4 MG PO TABS: 4.0000 mg | ORAL_TABLET | Freq: Four times a day (QID) | ORAL | Status: DC | PRN

## 2016-10-29 MED ORDER — FENTANYL CITRATE (PF) 100 MCG/2ML IJ SOLN
50.0000 ug | INTRAMUSCULAR | Status: DC | PRN
  Administered 2016-10-29: 50 ug via INTRAVENOUS
  Filled 2016-10-29: qty 2

## 2016-10-29 MED ORDER — ASPIRIN EC 81 MG PO TBEC
81.0000 mg | DELAYED_RELEASE_TABLET | Freq: Every day | ORAL | Status: DC
  Administered 2016-10-29: 81 mg via ORAL
  Filled 2016-10-29 (×2): qty 1

## 2016-10-29 MED ORDER — ONDANSETRON HCL 4 MG/2ML IJ SOLN
4.0000 mg | Freq: Once | INTRAMUSCULAR | Status: AC
  Administered 2016-10-29: 4 mg via INTRAVENOUS
  Filled 2016-10-29: qty 2

## 2016-10-29 MED ORDER — INSULIN ASPART 100 UNIT/ML ~~LOC~~ SOLN
0.0000 [IU] | Freq: Three times a day (TID) | SUBCUTANEOUS | Status: DC
  Administered 2016-10-30: 3 [IU] via SUBCUTANEOUS
  Filled 2016-10-29: qty 1

## 2016-10-29 MED ORDER — INSULIN ASPART 100 UNIT/ML ~~LOC~~ SOLN
4.0000 [IU] | Freq: Three times a day (TID) | SUBCUTANEOUS | Status: DC
  Administered 2016-10-30: 4 [IU] via SUBCUTANEOUS
  Filled 2016-10-29: qty 1

## 2016-10-29 MED ORDER — ENOXAPARIN SODIUM 40 MG/0.4ML ~~LOC~~ SOLN
40.0000 mg | SUBCUTANEOUS | Status: DC
  Administered 2016-10-29: 40 mg via SUBCUTANEOUS
  Filled 2016-10-29: qty 0.4

## 2016-10-29 MED ORDER — TRAMADOL HCL 50 MG PO TABS
50.0000 mg | ORAL_TABLET | Freq: Four times a day (QID) | ORAL | Status: DC | PRN
  Administered 2016-10-29: 50 mg via ORAL
  Filled 2016-10-29: qty 1

## 2016-10-29 MED ORDER — SODIUM CHLORIDE 0.9 % IV BOLUS (SEPSIS)
1000.0000 mL | Freq: Once | INTRAVENOUS | Status: AC
  Administered 2016-10-29: 1000 mL via INTRAVENOUS

## 2016-10-29 MED ORDER — CARBAMAZEPINE 200 MG PO TABS
200.0000 mg | ORAL_TABLET | Freq: Two times a day (BID) | ORAL | Status: DC
  Administered 2016-10-29 – 2016-10-30 (×2): 200 mg via ORAL
  Filled 2016-10-29 (×3): qty 1

## 2016-10-29 MED ORDER — INSULIN ASPART 100 UNIT/ML ~~LOC~~ SOLN: 0.0000 [IU] | Freq: Every day | SUBCUTANEOUS | Status: DC

## 2016-10-29 MED ORDER — NORTRIPTYLINE HCL 10 MG PO CAPS
10.0000 mg | ORAL_CAPSULE | Freq: Every day | ORAL | Status: DC
  Filled 2016-10-29 (×2): qty 1

## 2016-10-29 NOTE — ED Notes (Signed)
Report  Called to brandy rn floor nurse.  Pt alert  nsr on monitor.  Iv in place.  Skin warm and dry.

## 2016-10-29 NOTE — ED Notes (Signed)
primedoc in with pt now for admission 

## 2016-10-29 NOTE — ED Notes (Addendum)
Pt reports upper abd pain since last night.   Pt has nausea.  No v/d.  Pt also has back pain   No cough.  No chest pain. Pt alert.  Speech clear.  Sinus on moitor at 100.

## 2016-10-29 NOTE — H&P (Signed)
[facility] [place] [facility]   PATIENT NAME: Jeffrey Velazquez    MR#:  [id number]  DATE OF BIRTH:  [birth year]  DATE OF ADMISSION:  10/29/2016  PRIMARY CARE PHYSICIAN: Jeffrey IvanLinthavong, Kanhka, MD   REQUESTING/REFERRING PHYSICIAN:   CHIEF COMPLAINT:   Chief Complaint  Patient presents with  . Abdominal Pain    HISTORY OF PRESENT ILLNESS: Jeffrey Velazquez  is a 56 y.o. male with a known history of Diabetes, shingles, who presents to the hospital with complaints of ongoing intermittent chest and abdominal pain. I wonder the patient, he is been having pains in upper abdomen and chest since December 2017. He has been losing weight. He is been evaluated by Jeffrey Velazquez, who recommended to have cardiac clearance for endoscopies, he was scheduled for stress test for tomorrow, however, developed worsening pain in upper abdomen lower chest earlier today. Pain is described as 10 out of 10 by intensity, constant, exacerbated by moving around and walking accompanied by shortness of breath, radiating to the other side of the abdomen, mostly on the left side. He presented to his primary care physician, Jeffrey Velazquez who referred him to emergency room for admission and stress test. Hospitalist services were contacted for admission.  PAST MEDICAL HISTORY:   Past Medical History:  Diagnosis Date  . Diabetes mellitus without complication (HCC)   . Shingles     PAST SURGICAL HISTORY: Past Surgical History:  Procedure Laterality Date  . FRACTURE SURGERY Left 2010   Clavicle  . TONSILLECTOMY      SOCIAL HISTORY:  Social History  Substance Use Topics  . Smoking status: Never Smoker  . Smokeless tobacco: Never Used  . Alcohol use No    FAMILY HISTORY: Diabetes in patient's mother, heart disease and cancer in patient's mother, father had coronary artery disease  DRUG ALLERGIES:  Allergies  Allergen Reactions  . Codeine Nausea Only and Other (See Comments)    dizziness      Review of Systems  Constitutional: Positive for malaise/fatigue and weight loss. Negative for chills and fever.  HENT: Negative for congestion.   Eyes: Negative for blurred vision and double vision.  Respiratory: Negative for cough, sputum production, shortness of breath and wheezing.   Cardiovascular: Positive for chest pain. Negative for palpitations, orthopnea, leg swelling and PND.  Gastrointestinal: Positive for abdominal pain and nausea. Negative for blood in stool, constipation, diarrhea and vomiting.  Genitourinary: Negative for dysuria, frequency, hematuria and urgency.  Musculoskeletal: Negative for falls.  Neurological: Positive for dizziness. Negative for tremors, focal weakness and headaches.  Endo/Heme/Allergies: Does not bruise/bleed easily.  Psychiatric/Behavioral: Negative for depression. The patient does not have insomnia.     MEDICATIONS AT HOME:  Prior to Admission medications   Medication Sig Start Date End Date Taking? Authorizing Provider  aspirin EC 81 MG tablet Take 81 mg by mouth daily.   Yes [provider]  atorvastatin (LIPITOR) 20 MG tablet Take 20 mg by mouth daily.   Yes [provider]  carbamazepine (TEGRETOL) 200 MG tablet Take 200 mg by mouth 2 (two) times daily. 09/30/16  Yes [provider]  cholecalciferol (VITAMIN D) 1000 units tablet Take 5,000 Units by mouth daily.    Yes [provider]  folic acid (FOLVITE) 400 MCG tablet Take 1 tablet by mouth daily.   Yes [provider]  gabapentin (NEURONTIN) 300 MG capsule Take 600-900 mg by mouth 3 (three) times daily. Take 600 mg every morning and lunch  and 900 mg at bedtime.   Yes [provider]  glimepiride (AMARYL) 2 MG tablet Take 2 mg by mouth 2 (two) times daily.   Yes [provider]  Melatonin 5 MG TABS Take 10 mg by mouth at bedtime.    Yes [provider]  metFORMIN (GLUCOPHAGE-XR) 500 MG 24 hr tablet Take 500 mg by mouth  2 (two) times daily. 10/15/16  Yes [provider]  Multiple Vitamin (MULTI-VITAMINS) TABS Take 1 tablet by mouth daily.   Yes [provider]  nortriptyline (PAMELOR) 10 MG capsule Take 10 mg by mouth daily.  10/01/16  Yes [provider]  Omega-3 Fatty Acids (FISH OIL PO) Take 1 capsule by mouth 2 (two) times daily.   Yes [provider]  omeprazole (PRILOSEC) 20 MG capsule Take 20 mg by mouth daily. 10/13/16  Yes [provider]  VIAGRA 50 MG tablet Take 50 mg by mouth daily as needed for erectile dysfunction. 09/25/16  Yes [provider]  vitamin B-12 (CYANOCOBALAMIN) 1000 MCG tablet Take 100 mcg by mouth daily.   Yes [provider]  metFORMIN (GLUCOPHAGE) 500 MG tablet Take 1 tablet (500 mg total) by mouth daily with breakfast. Patient not taking: Reported on 10/15/2016 04/26/16 04/26/17  Myrna Blazer, MD  traMADol (ULTRAM) 50 MG tablet Take 1 tablet (50 mg total) by mouth every 6 (six) hours as needed. Patient not taking: Reported on 10/07/2016 04/20/16 04/20/17  Phineas Semen, MD      PHYSICAL EXAMINATION:   VITAL SIGNS: Blood pressure (!) 147/89, pulse (!) 104, temperature 98.2 F (36.8 C), temperature source Oral, resp. rate 13, height 5\' 8"  (1.727 m), weight 68 kg (150 lb), SpO2 99 %.  GENERAL:  56 y.o.-year-old patient lying in the bed In mild to moderate distress, holding his abdomen and chest with his arms, uncomfortable . Some diaphoretic to touch EYES: Pupils equal, round, reactive to light and accommodation. No scleral icterus. Extraocular muscles intact.  HEENT: Head atraumatic, normocephalic. Oropharynx and nasopharynx clear.  NECK:  Supple, no jugular venous distention. No thyroid enlargement, no tenderness.  LUNGS: Normal breath sounds bilaterally, no wheezing, rales,rhonchi or crepitation. No use of accessory muscles of respiration. Mild tenderness to low thoracic spinal palpation, no CVA tenderness  bilaterally CARDIOVASCULAR: S1, S2 normal. No murmurs, rubs, or gallops.  ABDOMEN: Soft, tender in the left upper quadrant, no rebound or guarding, nondistended. Bowel sounds present. No organomegaly or mass.  EXTREMITIES: No pedal edema, cyanosis, or clubbing.  NEUROLOGIC: Cranial nerves II through XII are intact. Muscle strength 5/5 in all extremities. Sensation intact. Gait not checked.  PSYCHIATRIC: The patient is alert and oriented x 3.  SKIN: No obvious rash, lesion, or ulcer.   LABORATORY PANEL:   CBC  Recent Labs Lab 10/29/16 1434  WBC 12.1*  HGB 15.2  HCT 42.2  PLT 298  MCV 86.9  MCH 31.3  MCHC 36.0  RDW 13.7   ------------------------------------------------------------------------------------------------------------------  Chemistries   Recent Labs Lab 10/29/16 1434  NA 134*  K 3.4*  CL 97*  CO2 25  GLUCOSE 189*  BUN 10  CREATININE 0.82  CALCIUM 10.2  AST 27  ALT 27  ALKPHOS 69  BILITOT 1.4*   ------------------------------------------------------------------------------------------------------------------  Cardiac Enzymes  Recent Labs Lab 10/29/16 1434  TROPONINI <0.03   ------------------------------------------------------------------------------------------------------------------  RADIOLOGY: Dg Chest 2 View  Result Date: 10/29/2016 CLINICAL DATA:  Pain across the lower chest and epigastric region. 30 pound weight loss. EXAM: CHEST  2  VIEW COMPARISON:  10/07/2016. FINDINGS: Normal cardiomediastinal silhouette. Clear lung fields. No acute bony abnormality. Old LEFT clavicle fracture. No change from priors. IMPRESSION: No active disease. Electronically Signed   By: Elsie Stain M.D.   On: 10/29/2016 15:08    EKG: Orders placed or performed during the hospital encounter of 10/29/16  . ED EKG within 10 minutes  . ED EKG within 10 minutes  . EKG 12-Lead  . EKG 12-Lead   EKG in emergency room reveals sinus tachycardia at rate of 135 bpm,  nonspecific T wave abnormality   IMPRESSION AND PLAN:  Active Problems:   Chest pain   Sinus tachycardia   Hyponatremia   Hypokalemia  #1. Upper abdominal and chest pain of unclear etiology, admit patient, medical floor, cycle cardiac enzymes, initiate patient on metoprolol, nitroglycerin, aspirin, continue Lidoderm patch, Lipitor, get Myoview stress test in the morning #2. Sinus tachycardia, check TSH, urine drug screen #3. Weight loss, check TSH, continue PPI, follow up with Dr. Mechele Collin for endoscopies as outpatient if Myoview stress test is negative #4. Hyponatremia, initiate patient on IV fluids, follow sodium in the morning #5. Hypokalemia, supplement intravenously, check magnesium level, supplement if needed   All the records are reviewed and case discussed with ED provider. Management plans discussed with the patient, family and they are in agreement.  CODE STATUS: Code Status History    This patient does not have a recorded code status. Please follow your organizational policy for patients in this situation.       TOTAL TIME TAKING CARE OF THIS PATIENT: 55 minutes.    Katharina Caper M.D on 10/29/2016 at 6:07 PM  Between 7am to 6pm - Pager - [phone] After 6pm go to [email or web address] - password [email or web address] [facility]  [facility]  Office  [phone]  CC: Primary care physician; Jeffrey Ivan, MD

## 2016-10-29 NOTE — ED Notes (Signed)
ED Provider at bedside. 

## 2016-10-29 NOTE — ED Triage Notes (Signed)
Pt c/o pain across lower chest/epigastric area.  Pt c/o 30 lb weight loss since December.  Feels like cannot catch breath.  Mildly diaphoretic in triage.  Tachycardia present.  Has been seeing GI but reports has had severe pain today and doctor sent him over. Pt appears to be in pain.

## 2016-10-29 NOTE — ED Provider Notes (Signed)
[facility] Emergency Department Provider Note  ____________________________________________   First MD Initiated Contact with Patient 10/29/16 1625     (approximate)  I have reviewed the triage vital signs and the nursing notes.   HISTORY  Chief Complaint Abdominal Pain    HPI Jeffrey Velazquez is a 56 y.o. male with multiple prior visits recently to the emergency department as well as outpatient visits for chest and abdominal pain.  He presents today at the recommendation of his primary care doctor, Dr. Charlton Haws, for evaluation of chest pain.  He has had extensive evaluations including with regular CT imaging and CT angiography of the chest, abdomen, and pelvis as well as multiple sets of lab work, and no specific abnormality has been identified.  He frequently presents with tachycardia but without any specific acute or emergent medical condition identified.  He is currently going to a gastroenterologist and needs cardiac clearance for upper and lower endoscopies.  He has plans for a stress test tomorrow morning but started developing severe lower chest/upper abdominal pain that is sharp and stabbing and radiating throughout his chest similar to his prior episodes of pain.  He describes it as severe.  He went to see Dr. Charlton Haws today who recommended he come to the emergency department "hopefully to be admitted for them to figure out what is wrong and to do the cardiac clearance".  I verified this by reading Dr. Reginia Forts note in care everywhere.  When he initially arrived he had tachycardia in the 130s but by the time I signed that it improved to about 105.  He appears anxious as per when I saw him previously but reports that the pain is better but still present and indicates the bottom central area of his chest and upper abdomen.  Nothing in particular makes the patient's symptoms better nor worse.  He denies nausea, vomiting, and lower abdominal pain.  He  denies fever and chills and shortness of breath.  He states, as he has stated in previous visits, that he feels as if his abdomen is full and sometimes he has some bulging on his sides like something is trying to get out.  That improved several weeks ago but now it seems to be coming back again.  It has been noted previously that he goes to a pain clinic.   Past Medical History:  Diagnosis Date  . Diabetes mellitus without complication (HCC)   . Shingles     There are no active problems to display for this patient.   Past Surgical History:  Procedure Laterality Date  . FRACTURE SURGERY Left 2010   Clavicle  . TONSILLECTOMY      Prior to Admission medications   Medication Sig Start Date End Date Taking? Authorizing Provider  aspirin EC 81 MG tablet Take 81 mg by mouth daily.    [provider]  atorvastatin (LIPITOR) 20 MG tablet Take 20 mg by mouth daily.    [provider]  bismuth subsalicylate (PEPTO BISMOL) 262 MG chewable tablet Chew 262 mg by mouth daily as needed.    [provider]  carbamazepine (TEGRETOL) 200 MG tablet Take 200 mg by mouth 2 (two) times daily. 09/30/16   [provider]  cholecalciferol (VITAMIN D) 1000 units tablet Take 5,000 Units by mouth daily.     [provider]  folic acid (FOLVITE) 1 MG tablet Take 1 tablet by mouth daily.    [provider]  gabapentin (NEURONTIN) 300 MG capsule  Take 600-900 mg by mouth 3 (three) times daily. Take 600 mg every morning and lunch and 900 mg at bedtime.    [provider]  glimepiride (AMARYL) 2 MG tablet Take 2 mg by mouth 2 (two) times daily.    [provider]  hydrOXYzine (ATARAX/VISTARIL) 25 MG tablet Take 25 mg by mouth every 8 (eight) hours as needed.    [provider]  Melatonin 3 MG TABS Take 9 mg by mouth at bedtime.    [provider]  metFORMIN (GLUCOPHAGE) 500 MG tablet Take 1 tablet (500 mg total) by mouth daily  with breakfast. Patient not taking: Reported on 10/15/2016 04/26/16 04/26/17  Myrna Blazer, MD  metFORMIN (GLUCOPHAGE-XR) 500 MG 24 hr tablet Take 500 mg by mouth 2 (two) times daily. 10/15/16   [provider]  Multiple Vitamin (MULTI-VITAMINS) TABS Take 1 tablet by mouth daily.    [provider]  nortriptyline (PAMELOR) 10 MG capsule Take 20 mg by mouth daily. 10/01/16   [provider]  Omega-3 Fatty Acids (FISH OIL PO) Take 1 capsule by mouth 2 (two) times daily.    [provider]  omeprazole (PRILOSEC) 20 MG capsule Take 20 mg by mouth daily. 10/13/16   [provider]  traMADol (ULTRAM) 50 MG tablet Take 1 tablet (50 mg total) by mouth every 6 (six) hours as needed. Patient not taking: Reported on 10/07/2016 04/20/16 04/20/17  Phineas Semen, MD  VIAGRA 50 MG tablet Take 50 mg by mouth daily as needed for erectile dysfunction. 09/25/16   [provider]  vitamin B-12 (CYANOCOBALAMIN) 100 MCG tablet Take 100 mcg by mouth daily.    [provider]    Allergies Codeine  History reviewed. No pertinent family history.  Social History Social History  Substance Use Topics  . Smoking status: Never Smoker  . Smokeless tobacco: Never Used  . Alcohol use No    Review of Systems Constitutional: No fever/chills Eyes: No visual changes. ENT: No sore throat. Cardiovascular: Denies chest pain. Respiratory: Denies shortness of breath. Gastrointestinal: No abdominal pain.  No nausea, no vomiting.  No diarrhea.  No constipation. Genitourinary: Negative for dysuria. Musculoskeletal: Negative for neck pain.  Negative for back pain. Integumentary: Negative for rash. Neurological: Negative for headaches, focal weakness or numbness.   ____________________________________________   PHYSICAL EXAM:  VITAL SIGNS: ED Triage Vitals [10/29/16 1426]  Enc Vitals Group     BP 125/88     Pulse Rate (!) 133     Resp (!) 22      Temp 98.2 F (36.8 C)     Temp Source Oral     SpO2 100 %     Weight 68 kg (150 lb)     Height 1.727 m (5\' 8" )     Head Circumference      Peak Flow      Pain Score 10     Pain Loc      Pain Edu?      Excl. in GC?     Constitutional: Alert and oriented. Well appearing and in no acute distress But appears anxious Eyes: Conjunctivae are normal.  Head: Atraumatic. Nose: No congestion/rhinnorhea. Mouth/Throat: Mucous membranes are moist. Neck: No stridor.  No meningeal signs.   Cardiovascular: Normal rate, regular rhythm. Good peripheral circulation. Grossly normal heart sounds. Respiratory: Normal respiratory effort.  No retractions. Lungs CTAB. Gastrointestinal: Soft with mild diffuse abdominal tenderness throughout with no focal abnormalities Musculoskeletal: No lower extremity tenderness  nor edema. No gross deformities of extremities. Neurologic:  Normal speech and language. No gross focal neurologic deficits are appreciated.  Skin:  Skin is warm, dry and intact. No rash noted. Psychiatric: Mood and affect are normal. Speech and behavior are normal.  ____________________________________________   LABS (all labs ordered are listed, but only abnormal results are displayed)  Labs Reviewed  BASIC METABOLIC PANEL - Abnormal; Notable for the following:       Result Value   Sodium 134 (*)    Potassium 3.4 (*)    Chloride 97 (*)    Glucose, Bld 189 (*)    All other components within normal limits  CBC - Abnormal; Notable for the following:    WBC 12.1 (*)    All other components within normal limits  TROPONIN I  LIPASE, BLOOD  URINALYSIS, COMPLETE (UACMP) WITH MICROSCOPIC  HEPATIC FUNCTION PANEL  URINE DRUG SCREEN, QUALITATIVE ([facility] ONLY)   ____________________________________________  EKG  ED ECG REPORT I, Tamyia Minich, the attending physician, personally viewed and interpreted this ECG.  Date: 10/29/2016 EKG Time: 14:30 Rate: 135 Rhythm: sinus tachycardia QRS  Axis: normal Intervals: normal ST/T Wave abnormalities: Non-specific ST segment / T-wave changes, but no evidence of acute ischemia. Narrative Interpretation: no evidence of acute ischemia   ____________________________________________  RADIOLOGY   Dg Chest 2 View  Result Date: 10/29/2016 CLINICAL DATA:  Pain across the lower chest and epigastric region. 30 pound weight loss. EXAM: CHEST  2 VIEW COMPARISON:  10/07/2016. FINDINGS: Normal cardiomediastinal silhouette. Clear lung fields. No acute bony abnormality. Old LEFT clavicle fracture. No change from priors. IMPRESSION: No active disease. Electronically Signed   By: Elsie StainJohn T Curnes M.D.   On: 10/29/2016 15:08    ____________________________________________   PROCEDURES  Critical Care performed: No   Procedure(s) performed:   Procedures   ____________________________________________   INITIAL IMPRESSION / ASSESSMENT AND PLAN / ED COURSE  Pertinent labs & imaging results that were available during my care of the patient were reviewed by me and considered in my medical decision making (see chart for details).  The patient has had extensive emergency department and outpatient workups that have never identified an acute or emergent medical condition.  His labs are again reassuring today and after getting here and receiving a dose of fentanyl in triage his heart rate has settled down to around 100 and he states his pain is mild.  I suspect that ultimately his pain will either be from a GI source or continue to remain unidentified.  However, based on his on-going chest pain, I will discuss with the hospitalist for chest pain obs, cardiac clearance, and GI follow up.     Clinical Course as of Oct 29 1712  Thu Oct 29, 2016  1632 I reviewed the patient's prescription history over the last 12 months in the multi-state controlled substances database(s) that includes [place], [place], [place], [place], [place], [place], [place], [place], [place], [place], [place], and [place].  Results were notable for a prescription for Lyrica obtained 3 months ago, and 3 prescriptions for tramadol obtain several months before that.  Otherwise I see no controlled substances over the course of the last year.  [CF]    Clinical Course User Index [CF] Loleta RoseForbach, Pharrell Ledford, MD    ____________________________________________  FINAL CLINICAL IMPRESSION(S) / ED DIAGNOSES  Final diagnoses:  Chest pain, unspecified type     MEDICATIONS GIVEN DURING THIS VISIT:  Medications  fentaNYL (SUBLIMAZE) injection 50 mcg (  50 mcg Intravenous Given 10/29/16 1439)  ondansetron (ZOFRAN) injection 4 mg (4 mg Intravenous Given 10/29/16 1439)  sodium chloride 0.9 % bolus 1,000 mL (0 mLs Intravenous Stopped 10/29/16 1615)     NEW OUTPATIENT MEDICATIONS STARTED DURING THIS VISIT:  New Prescriptions   No medications on file    Modified Medications   No medications on file    Discontinued Medications   No medications on file     Note:  This document was prepared using Dragon voice recognition software and may include unintentional dictation errors.    Loleta Rose, MD 10/29/16 1714

## 2016-10-30 ENCOUNTER — Observation Stay: Payer: 59

## 2016-10-30 LAB — NM MYOCAR MULTI W/SPECT W/WALL MOTION / EF
Estimated workload: 1 METS
Exercise duration (min): 1 min
Exercise duration (sec): 1 s
LV dias vol: 42 mL (ref 62–150)
LV sys vol: 14 mL
Peak HR: 125 {beats}/min
Rest HR: 110 {beats}/min
SDS: 0
SRS: 0
SSS: 1
TID: 1.19

## 2016-10-30 LAB — CBC
HCT: 34.3 % — ABNORMAL LOW (ref 40.0–52.0)
Hemoglobin: 12.6 g/dL — ABNORMAL LOW (ref 13.0–18.0)
MCH: 32 pg (ref 26.0–34.0)
MCHC: 36.8 g/dL — ABNORMAL HIGH (ref 32.0–36.0)
MCV: 86.9 fL (ref 80.0–100.0)
Platelets: 205 10*3/uL (ref 150–440)
RBC: 3.94 MIL/uL — ABNORMAL LOW (ref 4.40–5.90)
RDW: 13.4 % (ref 11.5–14.5)
WBC: 9.1 10*3/uL (ref 3.8–10.6)

## 2016-10-30 LAB — BASIC METABOLIC PANEL
Anion gap: 7 (ref 5–15)
BUN: 7 mg/dL (ref 6–20)
CO2: 27 mmol/L (ref 22–32)
Calcium: 9 mg/dL (ref 8.9–10.3)
Chloride: 103 mmol/L (ref 101–111)
Creatinine, Ser: 0.65 mg/dL (ref 0.61–1.24)
GFR calc Af Amer: >60 mL/min (ref >60)
GFR calc non Af Amer: >60 mL/min (ref >60)
Glucose, Bld: 195 mg/dL — ABNORMAL HIGH (ref 65–99)
Potassium: 3.7 mmol/L (ref 3.5–5.1)
Sodium: 137 mmol/L (ref 135–145)

## 2016-10-30 LAB — TROPONIN I
Troponin I: <0.03 ng/mL (ref <0.03)
Troponin I: <0.03 ng/mL (ref <0.03)

## 2016-10-30 LAB — GLUCOSE, CAPILLARY: Glucose-Capillary: 181 mg/dL — ABNORMAL HIGH (ref 65–99)

## 2016-10-30 MED ORDER — GLUCERNA SHAKE PO LIQD: 237.0000 mL | Freq: Two times a day (BID) | ORAL | 0 refills | Status: DC

## 2016-10-30 MED ORDER — GLUCERNA SHAKE PO LIQD: 237.0000 mL | ORAL | Status: DC | PRN

## 2016-10-30 MED ORDER — OMEPRAZOLE 20 MG PO CPDR: 40.0000 mg | DELAYED_RELEASE_CAPSULE | Freq: Every day | ORAL | 1 refills | Status: DC

## 2016-10-30 MED ORDER — TECHNETIUM TC 99M TETROFOSMIN IV KIT
13.4200 | PACK | Freq: Once | INTRAVENOUS | Status: AC | PRN
  Administered 2016-10-30: 13.42 via INTRAVENOUS

## 2016-10-30 MED ORDER — TECHNETIUM TC 99M TETROFOSMIN IV KIT
30.0000 | PACK | Freq: Once | INTRAVENOUS | Status: AC | PRN
  Administered 2016-10-30: 33.1 via INTRAVENOUS

## 2016-10-30 MED ORDER — REGADENOSON 0.4 MG/5ML IV SOLN
0.4000 mg | Freq: Once | INTRAVENOUS | Status: AC
  Administered 2016-10-30: 0.4 mg via INTRAVENOUS
  Filled 2016-10-30: qty 5

## 2016-10-30 MED ORDER — METOPROLOL TARTRATE 25 MG PO TABS: 12.5000 mg | ORAL_TABLET | Freq: Two times a day (BID) | ORAL | 0 refills | Status: DC

## 2016-10-30 NOTE — Progress Notes (Signed)
Patient alert and oriented, vss, no complaints of pain.  D/c today with wife.  No questions. F/u appts made.  Escorted out of hospital via wheelcahir by volunteers.

## 2016-10-30 NOTE — Discharge Summary (Signed)
[facility] - [place] at [facility]   PATIENT NAME: Jeffrey Velazquez    MR#:  [id number]  DATE OF BIRTH:  10-10-1960  DATE OF ADMISSION:  10/29/2016 ADMITTING PHYSICIAN: Katharina Caper, MD  DATE OF DISCHARGE: No discharge date for patient encounter.  PRIMARY CARE PHYSICIAN: Marisue Ivan, MD    ADMISSION DIAGNOSIS:  Chest pain [R07.9] Chest pain, unspecified type [R07.9]  DISCHARGE DIAGNOSIS:   Chest pain FTT SECONDARY DIAGNOSIS:   Past Medical History:  Diagnosis Date  . Diabetes mellitus without complication (HCC)   . Shingles     HOSPITAL COURSE:  HISTORY OF PRESENT ILLNESS: Jeffrey Velazquez  is a 56 y.o. male with a known history of Diabetes, shingles, who presents to the hospital with complaints of ongoing intermittent chest and abdominal pain. I wonder the patient, he is been having pains in upper abdomen and chest since December 2017. He has been losing weight. He is been evaluated by Dr. Mechele Collin, who recommended to have cardiac clearance for endoscopies, he was scheduled for stress test for tomorrow, however, developed worsening pain in upper abdomen lower chest earlier today. Pain is described as 10 out of 10 by intensity, constant, exacerbated by moving around and walking accompanied by shortness of breath, radiating to the other side of the abdomen, mostly on the left side. He presented to his primary care physician, Dr. Burnadette Pop who referred him to emergency room for admission and stress test. Hospitalist services were contacted for admission  #1. Upper abdominal and chest pain of unclear etiology Acute MI ruled out with negative cardiac enzymes Myoview stress test at low risk Outpatient follow-up with gastroenterology Dr. Mechele Collin   initiated patient on metoprolol, ec aspirin  and Myoview stress test is negative, continue  Lipitor  #2. Sinus tachycardia, TSH is normal  #3. Weight loss, normal TSH, continue PPI, follow up with Dr. Mechele Collin  for endoscopies as outpatient if Myoview stress test is negative  #4. Hyponatremia, improved with IV fluids sodium at 137  #5. Hypokalemia, supplemented intravenously, repeat potassium at 3.7  DISCHARGE CONDITIONS:   stable  CONSULTS OBTAINED:     PROCEDURES  myoview -nml  DRUG ALLERGIES:   Allergies  Allergen Reactions  . Codeine Nausea Only and Other (See Comments)    dizziness    DISCHARGE MEDICATIONS:   Current Discharge Medication List    START taking these medications   Details  feeding supplement, GLUCERNA SHAKE, (GLUCERNA SHAKE) LIQD Take 237 mLs by mouth 2 (two) times daily between meals. Qty: 60 Can, Refills: 0    metoprolol tartrate (LOPRESSOR) 25 MG tablet Take 0.5 tablets (12.5 mg total) by mouth 2 (two) times daily. Qty: 60 tablet, Refills: 0      CONTINUE these medications which have CHANGED   Details  omeprazole (PRILOSEC) 20 MG capsule Take 2 capsules (40 mg total) by mouth daily. Qty: 60 capsule, Refills: 1      CONTINUE these medications which have NOT CHANGED   Details  aspirin EC 81 MG tablet Take 81 mg by mouth daily.    atorvastatin (LIPITOR) 20 MG tablet Take 20 mg by mouth daily.    carbamazepine (TEGRETOL) 200 MG tablet Take 200 mg by mouth 2 (two) times daily. Refills: 3    cholecalciferol (VITAMIN D) 1000 units tablet Take 5,000 Units by mouth daily.     folic acid (FOLVITE) 400 MCG tablet Take 1 tablet by mouth daily.    gabapentin (NEURONTIN) 300 MG capsule Take 600-900 mg by  mouth 3 (three) times daily. Take 600 mg every morning and lunch and 900 mg at bedtime.    glimepiride (AMARYL) 2 MG tablet Take 2 mg by mouth 2 (two) times daily.    Melatonin 5 MG TABS Take 10 mg by mouth at bedtime.     metFORMIN (GLUCOPHAGE-XR) 500 MG 24 hr tablet Take 500 mg by mouth 2 (two) times daily.    Multiple Vitamin (MULTI-VITAMINS) TABS Take 1 tablet by mouth daily.    nortriptyline (PAMELOR) 10 MG capsule Take 10 mg by mouth daily.   Refills: 1    Omega-3 Fatty Acids (FISH OIL PO) Take 1 capsule by mouth 2 (two) times daily.    VIAGRA 50 MG tablet Take 50 mg by mouth daily as needed for erectile dysfunction. Refills: 3    vitamin B-12 (CYANOCOBALAMIN) 1000 MCG tablet Take 100 mcg by mouth daily.    traMADol (ULTRAM) 50 MG tablet Take 1 tablet (50 mg total) by mouth every 6 (six) hours as needed. Qty: 15 tablet, Refills: 0      STOP taking these medications     metFORMIN (GLUCOPHAGE) 500 MG tablet          DISCHARGE INSTRUCTIONS:   Follow-up with primary care physician in a week Follow-up with cardiology Dr. Juliann Pares in 1-2 weeks Follow-up with GI Dr. Mechele Collin in a week  DIET:  Diabetic diet  DISCHARGE CONDITION:  Fair  ACTIVITY:  Activity as tolerated  OXYGEN:  Home Oxygen: No.   Oxygen Delivery: room air  DISCHARGE LOCATION:  home   If you experience worsening of your admission symptoms, develop shortness of breath, life threatening emergency, suicidal or homicidal thoughts you must seek medical attention immediately by calling 911 or calling your MD immediately  if symptoms less severe.  You Must read complete instructions/literature along with all the possible adverse reactions/side effects for all the Medicines you take and that have been prescribed to you. Take any new Medicines after you have completely understood and accpet all the possible adverse reactions/side effects.   Please note  You were cared for by a hospitalist during your hospital stay. If you have any questions about your discharge medications or the care you received while you were in the hospital after you are discharged, you can call the unit and asked to speak with the hospitalist on call if the hospitalist that took care of you is not available. Once you are discharged, your primary care physician will handle any further medical issues. Please note that NO REFILLS for any discharge medications will be authorized once you  are discharged, as it is imperative that you return to your primary care physician (or establish a relationship with a primary care physician if you do not have one) for your aftercare needs so that they can reassess your need for medications and monitor your lab values.     Today  Chief Complaint  Patient presents with  . Abdominal Pain   Pt is having epigastric abd pain, he is following Dr. Mechele Collin as an outpatient  ROS:  CONSTITUTIONAL: Denies fevers, chills. Denies any fatigue, weakness.  EYES: Denies blurry vision, double vision, eye pain. EARS, NOSE, THROAT: Denies tinnitus, ear pain, hearing loss. RESPIRATORY: Denies cough, wheeze, shortness of breath.  CARDIOVASCULAR: Denies chest pain, palpitations, edema.  GASTROINTESTINAL: Denies nausea, vomiting, diarrhea, abdominal pain. Denies bright red blood per rectum. GENITOURINARY: Denies dysuria, hematuria. ENDOCRINE: Denies nocturia or thyroid problems. HEMATOLOGIC AND LYMPHATIC: Denies easy bruising or bleeding. SKIN:  Denies rash or lesion. MUSCULOSKELETAL: Denies pain in neck, back, shoulder, knees, hips or arthritic symptoms.  NEUROLOGIC: Denies paralysis, paresthesias.  PSYCHIATRIC: Denies anxiety or depressive symptoms.   VITAL SIGNS:  Blood pressure 134/87, pulse (!) 129, temperature 98.4 F (36.9 C), temperature source Oral, resp. rate 18, height 5\' 8"  (1.727 m), weight 66.8 kg (147 lb 4.8 oz), SpO2 100 %.  I/O:    Intake/Output Summary (Last 24 hours) at 10/30/16 1309 Last data filed at 10/30/16 0548  Gross per 24 hour  Intake                0 ml  Output              675 ml  Net             -675 ml    PHYSICAL EXAMINATION:  GENERAL:  56 y.o.-year-old patient lying in the bed with no acute distress.  EYES: Pupils equal, round, reactive to light and accommodation. No scleral icterus. Extraocular muscles intact.  HEENT: Head atraumatic, normocephalic. Oropharynx and nasopharynx clear.  NECK:  Supple, no jugular  venous distention. No thyroid enlargement, no tenderness.  LUNGS: Normal breath sounds bilaterally, no wheezing, rales,rhonchi or crepitation. No use of accessory muscles of respiration.  CARDIOVASCULAR: S1, S2 normal. No murmurs, rubs, or gallops.  ABDOMEN: Soft, non-tender, non-distended. Bowel sounds present. No organomegaly or mass.  EXTREMITIES: No pedal edema, cyanosis, or clubbing.  NEUROLOGIC: Cranial nerves II through XII are intact. Muscle strength 5/5 in all extremities. Sensation intact. Gait not checked.  PSYCHIATRIC: The patient is alert and oriented x 3.  SKIN: No obvious rash, lesion, or ulcer.   DATA REVIEW:   CBC  Recent Labs Lab 10/30/16 1008  WBC 9.1  HGB 12.6*  HCT 34.3*  PLT 205    Chemistries   Recent Labs Lab 10/29/16 1434  10/30/16 1008  NA 134*  --  137  K 3.4*  --  3.7  CL 97*  --  103  CO2 25  --  27  GLUCOSE 189*  --  195*  BUN 10  --  7  CREATININE 0.82  < > 0.65  CALCIUM 10.2  --  9.0  MG 1.8  --   --   AST 27  --   --   ALT 27  --   --   ALKPHOS 69  --   --   BILITOT 1.4*  --   --   < > = values in this interval not displayed.  Cardiac Enzymes  Recent Labs Lab 10/30/16 1008  TROPONINI <0.03    Microbiology Results  No results found for this or any previous visit.  RADIOLOGY:  Dg Chest 2 View  Result Date: 10/29/2016 CLINICAL DATA:  Pain across the lower chest and epigastric region. 30 pound weight loss. EXAM: CHEST  2 VIEW COMPARISON:  10/07/2016. FINDINGS: Normal cardiomediastinal silhouette. Clear lung fields. No acute bony abnormality. Old LEFT clavicle fracture. No change from priors. IMPRESSION: No active disease. Electronically Signed   By: Elsie StainJohn T Curnes M.D.   On: 10/29/2016 15:08   Nm Myocar Multi W/spect W/wall Motion / Ef  Result Date: 10/30/2016  Blood pressure demonstrated a normal response to exercise.  The study is normal.  This is a low risk study.  The left ventricular ejection fraction is hyperdynamic  (>65%).  There was no ST segment deviation noted during stress.  Normal stress test Normal lv function Low risk study  EKG:   Orders placed or performed during the hospital encounter of 10/29/16  . ED EKG within 10 minutes  . ED EKG within 10 minutes  . EKG 12-Lead  . EKG 12-Lead      Management plans discussed with the patient, family and they are in agreement.  CODE STATUS:     Code Status Orders        Start     Ordered   10/29/16 1950  Full code  Continuous     10/29/16 1950    Code Status History    Date Active Date Inactive Code Status Order ID Comments User Context   This patient has a current code status but no historical code status.      TOTAL TIME TAKING CARE OF THIS PATIENT: 45  minutes.   Note: This dictation was prepared with Dragon dictation along with smaller phrase technology. Any transcriptional errors that result from this process are unintentional.   @[facility] @  on 10/30/2016 at 1:09 PM  Between 7am to 6pm - Pager - [phone]  After 6pm go to [email or web address] - password [email or web address] [facility]  [facility]  Office  [phone]  CC: Primary care physician; Marisue Ivan, MD

## 2016-10-30 NOTE — Progress Notes (Signed)
Patient not having any chest pain at this time.  Stress test normal.  Dr. Amado CoeGouru to see patient and potentially discharge.

## 2016-10-30 NOTE — Progress Notes (Signed)
Initial Nutrition Assessment  DOCUMENTATION CODES:   Severe malnutrition in context of chronic illness  INTERVENTION:  1. Glucerna Shake po PRN, each supplement provides 220 kcal and 10 grams of protein  NUTRITION DIAGNOSIS:   Malnutrition (Severe) related to chronic illness (early satiety) as evidenced by energy intake < 75% for > or equal to 1 month, percent weight loss.  GOAL:   Patient will meet greater than or equal to 90% of their needs  MONITOR:   PO intake, I & O's, Labs, Weight trends, Supplement acceptance  REASON FOR ASSESSMENT:   Malnutrition Screening Tool    ASSESSMENT:   Jeffrey Velazquez  is a 56 y.o. male with a known history of Diabetes, shingles, who presents to the hospital with complaints of ongoing intermittent chest and abdominal pain.  Spoke with Mr. Gavin PottersKernodle at bedside. He states he has been eating 1 meal per day. Normally takes a few bites and can't eat anymore. Sometimes he will drink a bottle of water and not be able to eat anything else to the point of feeling he will vomit. This has improved over time. He tolerated eggs and grits this morning. Reports that this lack of PO intake started back when he had shingles in December, since then he has lost 40#(15% severe wt loss over 6 months) He consumes 3 glucernas per day when he does not feel like eating. Did not want glucerna in addition to meals today but ordered PRN to allow him to consume when he is not hungry. Nutrition-Focused physical exam completed. Findings are no fat depletion, no muscle depletion, and no edema.  He is to undergo EGD today pending results from his stress test and determine the cause of his early satiety. Labs and medications reviewed: Tbili 1.4 Novolog, Protonix, B12, Omega3s NS w/ KCL 20mEq @ 13125mL/hr   Diet Order:  Diet Carb Modified Fluid consistency: Thin; Room service appropriate? Yes  Skin:  Reviewed, no issues  Last BM:  10/29/2016  Height:   Ht Readings from Last 1  Encounters:  10/29/16 5\' 8"  (1.727 m)    Weight:   Wt Readings from Last 1 Encounters:  10/30/16 147 lb 4.8 oz (66.8 kg)    Ideal Body Weight:  70 kg  BMI:  Body mass index is 22.4 kg/m.  Estimated Nutritional Needs:   Kcal:  1700-2000 calories  Protein:  80-95 grams  Fluid:  >/= 1.7L  EDUCATION NEEDS:   No education needs identified at this time  Dionne AnoWilliam M. Lashaunta Sicard, MS, RD LDN Inpatient Clinical Dietitian Pager [phone]

## 2016-10-30 NOTE — Discharge Instructions (Signed)
Follow-up with primary care physician in a week Follow-up with cardiology Dr. Juliann Paresallwood in 1-2 weeks Follow-up with GI Dr. Mechele CollinElliott in a week

## 2016-10-31 LAB — HEMOGLOBIN A1C
Hgb A1c MFr Bld: 6.8 % — ABNORMAL HIGH (ref 4.8–5.6)
Mean Plasma Glucose: 148 mg/dL

## 2016-10-31 LAB — HIV ANTIBODY (ROUTINE TESTING W REFLEX): HIV Screen 4th Generation wRfx: NONREACTIVE

## 2016-11-05 ENCOUNTER — Encounter: Payer: Self-pay | Admitting: *Deleted

## 2016-11-05 DIAGNOSIS — R52 Pain, unspecified: Secondary | ICD-10-CM | POA: Insufficient documentation

## 2016-11-06 ENCOUNTER — Encounter
Admission: RE | Disposition: A | Discharge status: Home / Self Care | Payer: Self-pay | Source: Ambulatory Visit | Attending: Unknown Physician Specialty

## 2016-11-06 ENCOUNTER — Encounter: Payer: Self-pay | Admitting: *Deleted

## 2016-11-06 ENCOUNTER — Ambulatory Visit: Payer: 59 | Admitting: Certified Registered"

## 2016-11-06 ENCOUNTER — Ambulatory Visit
Admission: RE | Admit: 2016-11-06 | Discharge: 2016-11-06 | Disposition: A | Discharge status: Home / Self Care | Payer: 59 | Source: Ambulatory Visit | Attending: Unknown Physician Specialty | Admitting: Unknown Physician Specialty

## 2016-11-06 DIAGNOSIS — E785 Hyperlipidemia, unspecified: Secondary | ICD-10-CM | POA: Diagnosis not present

## 2016-11-06 DIAGNOSIS — Z7982 Long term (current) use of aspirin: Secondary | ICD-10-CM | POA: Insufficient documentation

## 2016-11-06 DIAGNOSIS — Z7984 Long term (current) use of oral hypoglycemic drugs: Secondary | ICD-10-CM | POA: Insufficient documentation

## 2016-11-06 DIAGNOSIS — E119 Type 2 diabetes mellitus without complications: Secondary | ICD-10-CM | POA: Diagnosis not present

## 2016-11-06 DIAGNOSIS — K2980 Duodenitis without bleeding: Secondary | ICD-10-CM | POA: Insufficient documentation

## 2016-11-06 DIAGNOSIS — Z79899 Other long term (current) drug therapy: Secondary | ICD-10-CM | POA: Diagnosis not present

## 2016-11-06 DIAGNOSIS — I10 Essential (primary) hypertension: Secondary | ICD-10-CM | POA: Insufficient documentation

## 2016-11-06 DIAGNOSIS — K2950 Unspecified chronic gastritis without bleeding: Secondary | ICD-10-CM | POA: Insufficient documentation

## 2016-11-06 DIAGNOSIS — R1310 Dysphagia, unspecified: Secondary | ICD-10-CM | POA: Diagnosis not present

## 2016-11-06 DIAGNOSIS — R1013 Epigastric pain: Secondary | ICD-10-CM | POA: Diagnosis present

## 2016-11-06 HISTORY — DX: Hyperlipidemia, unspecified: E78.5

## 2016-11-06 HISTORY — PX: ESOPHAGOGASTRODUODENOSCOPY (EGD) WITH PROPOFOL: SHX5813

## 2016-11-06 LAB — GLUCOSE, CAPILLARY: Glucose-Capillary: 174 mg/dL — ABNORMAL HIGH (ref 65–99)

## 2016-11-06 SURGERY — ESOPHAGOGASTRODUODENOSCOPY (EGD) WITH PROPOFOL
Anesthesia: General

## 2016-11-06 MED ORDER — MIDAZOLAM HCL 2 MG/2ML IJ SOLN
INTRAMUSCULAR | Status: DC | PRN
  Administered 2016-11-06: 1 mg via INTRAVENOUS

## 2016-11-06 MED ORDER — FENTANYL CITRATE (PF) 100 MCG/2ML IJ SOLN
INTRAMUSCULAR | Status: DC | PRN
  Administered 2016-11-06: 50 ug via INTRAVENOUS

## 2016-11-06 MED ORDER — PROPOFOL 500 MG/50ML IV EMUL
INTRAVENOUS | Status: DC | PRN
  Administered 2016-11-06: 150 ug/kg/min via INTRAVENOUS

## 2016-11-06 MED ORDER — LIDOCAINE HCL (CARDIAC) 20 MG/ML IV SOLN
INTRAVENOUS | Status: DC | PRN
  Administered 2016-11-06: 50 mg via INTRAVENOUS

## 2016-11-06 MED ORDER — SODIUM CHLORIDE 0.9 % IV SOLN
INTRAVENOUS | Status: DC
  Administered 2016-11-06: 08:00:00 via INTRAVENOUS

## 2016-11-06 MED ORDER — FENTANYL CITRATE (PF) 100 MCG/2ML IJ SOLN
INTRAMUSCULAR | Status: AC
  Filled 2016-11-06: qty 2

## 2016-11-06 MED ORDER — PROPOFOL 10 MG/ML IV BOLUS
INTRAVENOUS | Status: DC | PRN
  Administered 2016-11-06: 50 mg via INTRAVENOUS

## 2016-11-06 MED ORDER — LIDOCAINE HCL (PF) 2 % IJ SOLN
INTRAMUSCULAR | Status: AC
  Filled 2016-11-06: qty 2

## 2016-11-06 MED ORDER — PROPOFOL 10 MG/ML IV BOLUS
INTRAVENOUS | Status: AC
  Filled 2016-11-06: qty 20

## 2016-11-06 MED ORDER — SODIUM CHLORIDE 0.9 % IV SOLN: INTRAVENOUS | Status: DC

## 2016-11-06 NOTE — Anesthesia Procedure Notes (Signed)
Performed by: Ofilia Rayon Pre-anesthesia Checklist: Patient identified, Emergency Drugs available, Suction available, Patient being monitored and Timeout performed Patient Re-evaluated:Patient Re-evaluated prior to inductionOxygen Delivery Method: Nasal cannula Preoxygenation: Pre-oxygenation with 100% oxygen Intubation Type: IV induction       

## 2016-11-06 NOTE — Op Note (Addendum)
[facility] Gastroenterology Patient Name: Jeffrey DeitersJohn Janowski Procedure Date: 11/06/2016 8:56 AM MRN: [id number] Account #: [id number] Date of Birth: 1961/03/16 Admit Type: Outpatient Age: [age] Room[facility] ENDO ROOM 3 Gender: Male Note Status: Finalized Procedure:            Upper GI endoscopy Indications:          Epigastric abdominal pain Providers:            Scot Junobert T. Elliott, MD Referring MD:         Marisue IvanKanhka Linthavong (Referring MD) Medicines:            Propofol per Anesthesia Complications:        No immediate complications. Procedure:            Pre-Anesthesia Assessment:                       - After reviewing the risks and benefits, the patient                        was deemed in satisfactory condition to undergo the                        procedure.                       - After reviewing the risks and benefits, the patient                        was deemed in satisfactory condition to undergo the                        procedure.                       After obtaining informed consent, the endoscope was                        passed under direct vision. Throughout the procedure,                        the patient's blood pressure, pulse, and oxygen                        saturations were monitored continuously. The Endoscope                        was introduced through the mouth, and advanced to the                        second part of duodenum. The upper GI endoscopy was                        accomplished without difficulty. The patient tolerated                        the procedure well. Findings:      The examined esophagus was normal. Due to dysphagia I passed a guide       wire at the end of the procedure and then removed the scope and passed a       22F Savary dilator with minimal resistance.  The entire examined stomach was normal. Biopsies done of antrum and body.      Diffuse mild inflammation characterized by erythema and granularity was      found in the duodenal bulb. Biopsies were taken with a cold forceps for       histology.      The second portion of the duodenum was normal. Biopsies were taken with       a cold forceps for histology. Impression:           - Normal esophagus.                       - Normal stomach.                       - Duodenitis. Biopsied.                       - Normal second portion of the duodenum. Biopsied. Recommendation:       - Await pathology results. Recommend continue                        omeprazole given the inflammation in the first part of                        the duodenum. Scot Jun, MD 11/06/2016 9:18:34 AM This report has been signed electronically. Number of Addenda: 0 Note Initiated On: 11/06/2016 8:56 AM      [facility]

## 2016-11-06 NOTE — Anesthesia Post-op Follow-up Note (Cosign Needed)
Anesthesia QCDR form completed.        

## 2016-11-06 NOTE — H&P (Signed)
Primary Care Physician:  Marisue Ivan, MD Primary Gastroenterologist:  Dr. Mechele Collin  Pre-Procedure History & Physical: HPI:  Jeffrey Velazquez is a 56 y.o. male is here for an endoscopy.   Past Medical History:  Diagnosis Date  . Diabetes mellitus without complication (HCC)   . Hyperlipidemia   . Shingles     Past Surgical History:  Procedure Laterality Date  . FRACTURE SURGERY Left 2010   Clavicle  . TONSILLECTOMY      Prior to Admission medications   Medication Sig Start Date End Date Taking? Authorizing Provider  aspirin EC 81 MG tablet Take 81 mg by mouth daily.   Yes [provider]  atorvastatin (LIPITOR) 20 MG tablet Take 20 mg by mouth daily.   Yes [provider]  carbamazepine (TEGRETOL) 200 MG tablet Take 200 mg by mouth 2 (two) times daily. 09/30/16  Yes [provider]  cholecalciferol (VITAMIN D) 1000 units tablet Take 5,000 Units by mouth daily.    Yes [provider]  folic acid (FOLVITE) 400 MCG tablet Take 1 tablet by mouth daily.   Yes [provider]  gabapentin (NEURONTIN) 300 MG capsule Take 600-900 mg by mouth 3 (three) times daily. Take 600 mg every morning and lunch and 900 mg at bedtime.   Yes [provider]  glimepiride (AMARYL) 2 MG tablet Take 2 mg by mouth 2 (two) times daily.   Yes [provider]  Melatonin 5 MG TABS Take 10 mg by mouth at bedtime.    Yes [provider]  metFORMIN (GLUCOPHAGE-XR) 500 MG 24 hr tablet Take 500 mg by mouth 2 (two) times daily. 10/15/16  Yes [provider]  metoprolol tartrate (LOPRESSOR) 25 MG tablet Take 0.5 tablets (12.5 mg total) by mouth 2 (two) times daily. 10/30/16  Yes Gouru, Deanna Artis, MD  Multiple Vitamin (MULTI-VITAMINS) TABS Take 1 tablet by mouth daily.   Yes [provider]  Omega-3 Fatty Acids (FISH OIL PO) Take 1 capsule by mouth 2 (two) times daily.   Yes [provider]  omeprazole (PRILOSEC) 20 MG  capsule Take 2 capsules (40 mg total) by mouth daily. 10/30/16  Yes Gouru, Deanna Artis, MD  vitamin B-12 (CYANOCOBALAMIN) 1000 MCG tablet Take 100 mcg by mouth daily.   Yes [provider]  feeding supplement, GLUCERNA SHAKE, (GLUCERNA SHAKE) LIQD Take 237 mLs by mouth 2 (two) times daily between meals. 10/30/16   Ramonita Lab, MD  nortriptyline (PAMELOR) 10 MG capsule Take 10 mg by mouth daily.  10/01/16   [provider]  traMADol (ULTRAM) 50 MG tablet Take 1 tablet (50 mg total) by mouth every 6 (six) hours as needed. Patient not taking: Reported on 10/07/2016 04/20/16 04/20/17  Phineas Semen, MD  VIAGRA 50 MG tablet Take 50 mg by mouth daily as needed for erectile dysfunction. 09/25/16   [provider]    Allergies as of 11/05/2016 - Review Complete 11/05/2016  Allergen Reaction Noted  . Codeine Nausea Only and Other (See Comments) 04/20/2016    History reviewed. No pertinent family history.  Social History   Social History  . Marital status: Married    Spouse name: N/A  . Number of children: N/A  . Years of education: N/A   Occupational History  . Not on file.   Social History Main Topics  . Smoking status: Never Smoker  . Smokeless tobacco: Never Used  . Alcohol use No  . Drug use: No  . Sexual activity: Yes  Other Topics Concern  . Not on file   Social History Narrative  . No narrative on file    Review of Systems: See HPI, otherwise negative ROS  Physical Exam: BP 132/84   Pulse (!) 125   Temp (!) 96.7 F (35.9 C) (Tympanic)   Ht 5\' 8"  (1.727 m)   Wt 66.7 kg (147 lb)   SpO2 99%   BMI 22.35 kg/m  General:   Alert,  pleasant and cooperative in NAD Head:  Normocephalic and atraumatic. Neck:  Supple; no masses or thyromegaly. Lungs:  Clear throughout to auscultation.    Heart:  Regular rate and rhythm. Abdomen:  Soft, nontender and nondistended. Normal bowel sounds, without guarding, and without rebound.   Neurologic:  Alert and   oriented x4;  grossly normal neurologically.  Impression/Plan: Jeffrey Velazquez is here for an endoscopy to be performed for epigastric abdominal pain,dysphagia.  Risks, benefits, limitations, and alternatives regarding  endoscopy have been reviewed with the patient.  Questions have been answered.  All parties agreeable.   Lynnae PrudeELLIOTT, ROBERT, MD  11/06/2016, 8:55 AM

## 2016-11-06 NOTE — Transfer of Care (Signed)
Immediate Anesthesia Transfer of Care Note  Patient: Carilyn GoodpastureJohn D Kohrs  Procedure(s) Performed: Procedure(s): ESOPHAGOGASTRODUODENOSCOPY (EGD) WITH PROPOFOL (N/A)  Patient Location: PACU  Anesthesia Type:General  Level of Consciousness: sedated  Airway & Oxygen Therapy: Patient Spontanous Breathing and Patient connected to nasal cannula oxygen  Post-op Assessment: Report given to RN and Post -op Vital signs reviewed and stable  Post vital signs: Reviewed and stable  Last Vitals:  Vitals:   11/06/16 0812 11/06/16 0916  BP: 132/84 114/74  Pulse: (!) 125 94  Resp:  14  Temp: (!) 35.9 C     Last Pain:  Vitals:   11/06/16 0812  TempSrc: Tympanic         Complications: No apparent anesthesia complications

## 2016-11-06 NOTE — Anesthesia Preprocedure Evaluation (Signed)
Anesthesia Evaluation  Patient identified by MRN, date of birth, ID band Patient awake    Reviewed: Allergy & Precautions, NPO status , Patient's Chart, lab work & pertinent test results  History of Anesthesia Complications Negative for: history of anesthetic complications  Airway Mallampati: II  TM Distance: >3 FB Neck ROM: Full    Dental  (+) Poor Dentition, Chipped   Pulmonary neg pulmonary ROS, neg sleep apnea, neg COPD,    breath sounds clear to auscultation- rhonchi (-) wheezing      Cardiovascular Exercise Tolerance: Good hypertension, Pt. on medications (-) CAD, (-) Past MI and (-) Cardiac Stents  Rhythm:Regular Rate:Normal - Systolic murmurs and - Diastolic murmurs    Neuro/Psych negative neurological ROS  negative psych ROS   GI/Hepatic negative GI ROS, Neg liver ROS,   Endo/Other  diabetes, Oral Hypoglycemic Agents  Renal/GU negative Renal ROS     Musculoskeletal negative musculoskeletal ROS (+)   Abdominal (+) - obese,   Peds  Hematology negative hematology ROS (+)   Anesthesia Other Findings Past Medical History: No date: Diabetes mellitus without complication (HCC) No date: Hyperlipidemia No date: Shingles   Reproductive/Obstetrics                             Anesthesia Physical Anesthesia Plan  ASA: II  Anesthesia Plan: General   Post-op Pain Management:    Induction: Intravenous  PONV Risk Score and Plan: 1 and Propofol  Airway Management Planned: Natural Airway  Additional Equipment:   Intra-op Plan:   Post-operative Plan:   Informed Consent: I have reviewed the patients History and Physical, chart, labs and discussed the procedure including the risks, benefits and alternatives for the proposed anesthesia with the patient or authorized representative who has indicated his/her understanding and acceptance.   Dental advisory given  Plan Discussed with:  CRNA and Anesthesiologist  Anesthesia Plan Comments:         Anesthesia Quick Evaluation

## 2016-11-06 NOTE — Anesthesia Postprocedure Evaluation (Signed)
Anesthesia Post Note  Patient: Jeffrey GoodpastureJohn D Velazquez  Procedure(s) Performed: Procedure(s) (LRB): ESOPHAGOGASTRODUODENOSCOPY (EGD) WITH PROPOFOL (N/A)  Patient location during evaluation: Endoscopy Anesthesia Type: General Level of consciousness: awake and alert and oriented Pain management: pain level controlled Vital Signs Assessment: post-procedure vital signs reviewed and stable Respiratory status: spontaneous breathing, nonlabored ventilation and respiratory function stable Cardiovascular status: blood pressure returned to baseline and stable Postop Assessment: no signs of nausea or vomiting Anesthetic complications: no     Last Vitals:  Vitals:   11/06/16 1000 11/06/16 1005  BP: (!) 148/84 (!) 143/86  Pulse: 96 97  Resp: 19 13  Temp:      Last Pain:  Vitals:   11/06/16 0915  TempSrc: Tympanic                 Henry Utsey

## 2016-11-09 ENCOUNTER — Encounter: Payer: Self-pay | Admitting: Unknown Physician Specialty

## 2016-11-09 LAB — SURGICAL PATHOLOGY

## 2016-11-13 DIAGNOSIS — F450 Somatization disorder: Secondary | ICD-10-CM

## 2016-11-13 DIAGNOSIS — F419 Anxiety disorder, unspecified: Secondary | ICD-10-CM | POA: Insufficient documentation

## 2016-11-19 ENCOUNTER — Ambulatory Visit: Payer: 59 | Admitting: Anesthesiology

## 2016-11-19 ENCOUNTER — Encounter: Payer: Self-pay | Admitting: *Deleted

## 2016-11-19 ENCOUNTER — Encounter
Admission: RE | Disposition: A | Discharge status: Home / Self Care | Payer: Self-pay | Source: Ambulatory Visit | Attending: Unknown Physician Specialty

## 2016-11-19 ENCOUNTER — Ambulatory Visit
Admission: RE | Admit: 2016-11-19 | Discharge: 2016-11-19 | Disposition: A | Discharge status: Home / Self Care | Payer: 59 | Source: Ambulatory Visit | Attending: Unknown Physician Specialty | Admitting: Unknown Physician Specialty

## 2016-11-19 DIAGNOSIS — R1084 Generalized abdominal pain: Secondary | ICD-10-CM | POA: Insufficient documentation

## 2016-11-19 DIAGNOSIS — K219 Gastro-esophageal reflux disease without esophagitis: Secondary | ICD-10-CM | POA: Insufficient documentation

## 2016-11-19 DIAGNOSIS — F419 Anxiety disorder, unspecified: Secondary | ICD-10-CM | POA: Diagnosis not present

## 2016-11-19 DIAGNOSIS — K580 Irritable bowel syndrome with diarrhea: Secondary | ICD-10-CM | POA: Insufficient documentation

## 2016-11-19 DIAGNOSIS — F329 Major depressive disorder, single episode, unspecified: Secondary | ICD-10-CM | POA: Diagnosis not present

## 2016-11-19 DIAGNOSIS — B0229 Other postherpetic nervous system involvement: Secondary | ICD-10-CM | POA: Insufficient documentation

## 2016-11-19 DIAGNOSIS — K621 Rectal polyp: Secondary | ICD-10-CM | POA: Diagnosis not present

## 2016-11-19 DIAGNOSIS — Z7984 Long term (current) use of oral hypoglycemic drugs: Secondary | ICD-10-CM | POA: Insufficient documentation

## 2016-11-19 DIAGNOSIS — E785 Hyperlipidemia, unspecified: Secondary | ICD-10-CM | POA: Diagnosis not present

## 2016-11-19 DIAGNOSIS — Z885 Allergy status to narcotic agent status: Secondary | ICD-10-CM | POA: Insufficient documentation

## 2016-11-19 DIAGNOSIS — E119 Type 2 diabetes mellitus without complications: Secondary | ICD-10-CM | POA: Diagnosis not present

## 2016-11-19 DIAGNOSIS — Z7982 Long term (current) use of aspirin: Secondary | ICD-10-CM | POA: Insufficient documentation

## 2016-11-19 DIAGNOSIS — Z8249 Family history of ischemic heart disease and other diseases of the circulatory system: Secondary | ICD-10-CM | POA: Diagnosis not present

## 2016-11-19 DIAGNOSIS — Z79899 Other long term (current) drug therapy: Secondary | ICD-10-CM | POA: Insufficient documentation

## 2016-11-19 DIAGNOSIS — I10 Essential (primary) hypertension: Secondary | ICD-10-CM | POA: Insufficient documentation

## 2016-11-19 DIAGNOSIS — K640 First degree hemorrhoids: Secondary | ICD-10-CM | POA: Diagnosis not present

## 2016-11-19 HISTORY — DX: Depression, unspecified: F32.A

## 2016-11-19 HISTORY — DX: Gastro-esophageal reflux disease without esophagitis: K21.9

## 2016-11-19 HISTORY — DX: Abnormal weight loss: R63.4

## 2016-11-19 HISTORY — DX: Tachycardia, unspecified: R00.0

## 2016-11-19 HISTORY — DX: Other forms of dyspnea: R06.09

## 2016-11-19 HISTORY — DX: Anemia, unspecified: D64.9

## 2016-11-19 HISTORY — PX: COLONOSCOPY WITH PROPOFOL: SHX5780

## 2016-11-19 HISTORY — DX: Epigastric pain: R10.13

## 2016-11-19 HISTORY — DX: Major depressive disorder, single episode, unspecified: F32.9

## 2016-11-19 HISTORY — DX: Early satiety: R68.81

## 2016-11-19 HISTORY — DX: Anxiety disorder, unspecified: F41.9

## 2016-11-19 HISTORY — DX: Dyspnea, unspecified: R06.00

## 2016-11-19 HISTORY — DX: Irritable bowel syndrome, unspecified: K58.9

## 2016-11-19 LAB — GLUCOSE, CAPILLARY: Glucose-Capillary: 164 mg/dL — ABNORMAL HIGH (ref 65–99)

## 2016-11-19 SURGERY — COLONOSCOPY WITH PROPOFOL
Anesthesia: General

## 2016-11-19 MED ORDER — FENTANYL CITRATE (PF) 100 MCG/2ML IJ SOLN
INTRAMUSCULAR | Status: DC | PRN
  Administered 2016-11-19: 50 ug via INTRAVENOUS

## 2016-11-19 MED ORDER — PHENYLEPHRINE HCL 10 MG/ML IJ SOLN
INTRAMUSCULAR | Status: AC
  Filled 2016-11-19: qty 1

## 2016-11-19 MED ORDER — PROPOFOL 10 MG/ML IV BOLUS
INTRAVENOUS | Status: DC | PRN
  Administered 2016-11-19: 80 mg via INTRAVENOUS

## 2016-11-19 MED ORDER — PROPOFOL 500 MG/50ML IV EMUL
INTRAVENOUS | Status: AC
  Filled 2016-11-19: qty 50

## 2016-11-19 MED ORDER — MIDAZOLAM HCL 2 MG/2ML IJ SOLN
INTRAMUSCULAR | Status: AC
Start: 2016-11-19 — End: ?
  Filled 2016-11-19: qty 2

## 2016-11-19 MED ORDER — MIDAZOLAM HCL 2 MG/2ML IJ SOLN
INTRAMUSCULAR | Status: DC | PRN
  Administered 2016-11-19: 2 mg via INTRAVENOUS

## 2016-11-19 MED ORDER — METOPROLOL TARTRATE 25 MG PO TABS
ORAL_TABLET | ORAL | Status: AC
  Administered 2016-11-19: 08:00:00 via GASTROSTOMY
  Filled 2016-11-19: qty 1

## 2016-11-19 MED ORDER — FENTANYL CITRATE (PF) 100 MCG/2ML IJ SOLN
INTRAMUSCULAR | Status: AC
  Filled 2016-11-19: qty 2

## 2016-11-19 MED ORDER — SODIUM CHLORIDE 0.9 % IV SOLN
INTRAVENOUS | Status: DC
  Administered 2016-11-19: 07:00:00 via INTRAVENOUS

## 2016-11-19 MED ORDER — SODIUM CHLORIDE 0.9 % IV SOLN
INTRAVENOUS | Status: DC
  Administered 2016-11-19: 1000 mL via INTRAVENOUS

## 2016-11-19 MED ORDER — METOPROLOL TARTRATE 25 MG PO TABS: 25.0000 mg | ORAL_TABLET | Freq: Once | ORAL | Status: DC

## 2016-11-19 MED ORDER — PROPOFOL 500 MG/50ML IV EMUL
INTRAVENOUS | Status: DC | PRN
  Administered 2016-11-19: 100 ug/kg/min via INTRAVENOUS

## 2016-11-19 MED ORDER — EPHEDRINE SULFATE 50 MG/ML IJ SOLN
INTRAMUSCULAR | Status: DC | PRN
  Administered 2016-11-19: 5 mg via INTRAVENOUS

## 2016-11-19 NOTE — Op Note (Signed)
[facility] Gastroenterology Patient Name: Jeffrey Velazquez Procedure Date: 11/19/2016 7:35 AM MRN: [id number] Account #: [id number] Date of Birth: 1960-09-01 Admit Type: Outpatient Age: 56 Room: [facility] ENDO ROOM 1 Gender: Male Note Status: Finalized Procedure:            Colonoscopy Indications:          Generalized abdominal pain, Clinically significant                        diarrhea of unexplained origin Providers:            Scot Jun, MD Referring MD:         Marisue Ivan (Referring MD) Medicines:            Propofol per Anesthesia Complications:        No immediate complications. Procedure:            Pre-Anesthesia Assessment:                       - After reviewing the risks and benefits, the patient                        was deemed in satisfactory condition to undergo the                        procedure.                       After obtaining informed consent, the colonoscope was                        passed under direct vision. Throughout the procedure,                        the patient's blood pressure, pulse, and oxygen                        saturations were monitored continuously. The                        Colonoscope was introduced through the anus and                        advanced to the the cecum, identified by appendiceal                        orifice and ileocecal valve. The colonoscopy was                        performed without difficulty. The patient tolerated the                        procedure well. The quality of the bowel preparation                        was good. Findings:      Two sessile polyps were found in the rectum. The polyps were diminutive       in size. These polyps were removed with a jumbo cold forceps. Resection       and retrieval were complete.      Internal hemorrhoids were found during endoscopy.  The hemorrhoids were       small and Grade I (internal hemorrhoids that do not prolapse).      Due to  diarrhea I biopsied the lining of the colon in ascending and       transverse colon and put in one bottle and descending and sigmoid colon       in another bottle. Mucosa looked normal so will await biopsies. Impression:           - Two diminutive polyps in the rectum, removed with a                        jumbo cold forceps. Resected and retrieved.                       - Internal hemorrhoids. Recommendation:       - Await pathology results. Scot Junobert T Elliott, MD 11/19/2016 8:14:44 AM This report has been signed electronically. Number of Addenda: 0 Note Initiated On: 11/19/2016 7:35 AM Scope Withdrawal Time: 0 hours 12 minutes 17 seconds  Total Procedure Duration: 0 hours 15 minutes 25 seconds       [facility]

## 2016-11-19 NOTE — Anesthesia Procedure Notes (Signed)
Performed by: COOK-MARTIN, Siegfried Vieth Pre-anesthesia Checklist: Patient identified, Emergency Drugs available, Suction available, Patient being monitored and Timeout performed Patient Re-evaluated:Patient Re-evaluated prior to inductionOxygen Delivery Method: Nasal cannula Preoxygenation: Pre-oxygenation with 100% oxygen Intubation Type: IV induction Placement Confirmation: CO2 detector and positive ETCO2       

## 2016-11-19 NOTE — Anesthesia Preprocedure Evaluation (Signed)
Anesthesia Evaluation  Patient identified by MRN, date of birth, ID band Patient awake    Reviewed: Allergy & Precautions, NPO status , Patient's Chart, lab work & pertinent test results  History of Anesthesia Complications Negative for: history of anesthetic complications  Airway Mallampati: II       Dental   Pulmonary neg pulmonary ROS,           Cardiovascular hypertension, Pt. on medications and Pt. on home beta blockers      Neuro/Psych Anxiety Depression Post-herpetic neuralgia    GI/Hepatic Neg liver ROS, GERD  Medicated and Controlled,  Endo/Other  diabetes, Type 2, Oral Hypoglycemic Agents  Renal/GU negative Renal ROS     Musculoskeletal   Abdominal   Peds  Hematology  (+) anemia ,   Anesthesia Other Findings   Reproductive/Obstetrics                             Anesthesia Physical Anesthesia Plan  ASA: II  Anesthesia Plan: General   Post-op Pain Management:    Induction:   PONV Risk Score and Plan:   Airway Management Planned:   Additional Equipment:   Intra-op Plan:   Post-operative Plan:   Informed Consent: I have reviewed the patients History and Physical, chart, labs and discussed the procedure including the risks, benefits and alternatives for the proposed anesthesia with the patient or authorized representative who has indicated his/her understanding and acceptance.     Plan Discussed with:   Anesthesia Plan Comments:         Anesthesia Quick Evaluation

## 2016-11-19 NOTE — Anesthesia Postprocedure Evaluation (Signed)
Anesthesia Post Note  Patient: Jeffrey GoodpastureJohn D Fentress  Procedure(s) Performed: Procedure(s) (LRB): COLONOSCOPY WITH PROPOFOL (N/A)  Patient location during evaluation: Endoscopy Anesthesia Type: General Level of consciousness: awake and alert Pain management: pain level controlled Vital Signs Assessment: post-procedure vital signs reviewed and stable Respiratory status: spontaneous breathing and respiratory function stable Cardiovascular status: stable Anesthetic complications: no     Last Vitals:  Vitals:   11/19/16 0713 11/19/16 0818  BP: 125/87 (!) 86/64  Pulse: (!) 133 89  Resp: 18 13  Temp: (!) 36.3 C (!) 35.8 C    Last Pain:  Vitals:   11/19/16 0818  TempSrc: Tympanic  PainSc:                  [name]

## 2016-11-19 NOTE — Anesthesia Post-op Follow-up Note (Cosign Needed)
Anesthesia QCDR form completed.        

## 2016-11-19 NOTE — Transfer of Care (Signed)
Immediate Anesthesia Transfer of Care Note  Patient: Jeffrey GoodpastureJohn D Velazquez  Procedure(s) Performed: Procedure(s): COLONOSCOPY WITH PROPOFOL (N/A)  Patient Location: PACU  Anesthesia Type:General  Level of Consciousness: awake  Airway & Oxygen Therapy: Patient Spontanous Breathing and Patient connected to nasal cannula oxygen  Post-op Assessment: Report given to RN and Post -op Vital signs reviewed and stable  Post vital signs: Reviewed and stable  Last Vitals:  Vitals:   11/19/16 0713  BP: 125/87  Pulse: (!) 133  Resp: 18  Temp: (!) 36.3 C    Last Pain:  Vitals:   11/19/16 0713  TempSrc: Tympanic  PainSc: 4          Complications: No apparent anesthesia complications

## 2016-11-19 NOTE — H&P (Signed)
Primary Care Physician:  Marisue Ivan, MD Primary Gastroenterologist:  Dr. Mechele Collin  Pre-Procedure History & Physical: HPI:  Jeffrey Velazquez is a 56 y.o. male is here for an colonoscopy.   Past Medical History:  Diagnosis Date  . Anemia   . Anxiety   . Depression   . Diabetes mellitus without complication (HCC)   . DOE (dyspnea on exertion)   . Early satiety   . Epigastric abdominal pain   . GERD (gastroesophageal reflux disease)   . Hyperlipidemia   . Irritable bowel syndrome   . Shingles   . Tachycardia   . Unintended weight loss     Past Surgical History:  Procedure Laterality Date  . ESOPHAGOGASTRODUODENOSCOPY (EGD) WITH PROPOFOL N/A 11/06/2016   Procedure: ESOPHAGOGASTRODUODENOSCOPY (EGD) WITH PROPOFOL;  Surgeon: Scot Jun, MD;  Location: [facility] ENDOSCOPY;  Service: Endoscopy;  Laterality: N/A;  . FRACTURE SURGERY Left 2010   Clavicle  . TONSILLECTOMY    . TOOTH EXTRACTION     wisdom teeth x 4    Prior to Admission medications   Medication Sig Start Date End Date Taking? Authorizing Provider  atorvastatin (LIPITOR) 20 MG tablet Take 20 mg by mouth daily.   Yes [provider]  carbamazepine (TEGRETOL) 200 MG tablet Take 200 mg by mouth 2 (two) times daily. 09/30/16  Yes [provider]  cholecalciferol (VITAMIN D) 1000 units tablet Take 5,000 Units by mouth daily.    Yes [provider]  folic acid (FOLVITE) 400 MCG tablet Take 1 tablet by mouth daily.   Yes [provider]  gabapentin (NEURONTIN) 300 MG capsule Take 600-900 mg by mouth 3 (three) times daily. Take 600 mg every morning and lunch and 900 mg at bedtime.   Yes [provider]  glimepiride (AMARYL) 2 MG tablet Take 2 mg by mouth 2 (two) times daily.   Yes [provider]  Lancets 30G MISC by Does not apply route.   Yes [provider]  lidocaine (LIDODERM) 5 % Place 1 patch onto the skin daily. Remove & Discard patch within 12 hours  or as directed by MD   Yes [provider]  Melatonin 5 MG TABS Take 10 mg by mouth at bedtime.    Yes [provider]  metFORMIN (GLUCOPHAGE-XR) 500 MG 24 hr tablet Take 500 mg by mouth 2 (two) times daily. 10/15/16  Yes [provider]  metoprolol tartrate (LOPRESSOR) 25 MG tablet Take 0.5 tablets (12.5 mg total) by mouth 2 (two) times daily. 10/30/16  Yes Gouru, Deanna Artis, MD  Multiple Vitamin (MULTI-VITAMINS) TABS Take 1 tablet by mouth daily.   Yes [provider]  nortriptyline (PAMELOR) 10 MG capsule Take 10 mg by mouth daily.  10/01/16  Yes [provider]  Omega-3 Fatty Acids (FISH OIL PO) Take 1 capsule by mouth 2 (two) times daily.   Yes [provider]  omeprazole (PRILOSEC) 20 MG capsule Take 2 capsules (40 mg total) by mouth daily. 10/30/16  Yes Gouru, Aruna, MD  sucralfate (CARAFATE) 1 g tablet Take 1 g by mouth 4 (four) times daily -  with meals and at bedtime.   Yes [provider]  vitamin B-12 (CYANOCOBALAMIN) 1000 MCG tablet Take 100 mcg by mouth daily.   Yes [provider]  aspirin EC 81 MG tablet Take 81 mg by mouth daily.    [provider]  feeding supplement, GLUCERNA SHAKE, (GLUCERNA SHAKE) LIQD Take 237 mLs by mouth 2 (two) times  daily between meals. 10/30/16   Gouru, Deanna ArtisAruna, MD  traMADol (ULTRAM) 50 MG tablet Take 1 tablet (50 mg total) by mouth every 6 (six) hours as needed. Patient taking differently: Take 50 mg by mouth every 6 (six) hours as needed.  04/20/16 04/20/17  Phineas SemenGoodman, Graydon, MD  VIAGRA 50 MG tablet Take 50 mg by mouth daily as needed for erectile dysfunction. 09/25/16   [provider]    Allergies as of 11/13/2016 - Review Complete 11/06/2016  Allergen Reaction Noted  . Codeine Nausea Only and Other (See Comments) 04/20/2016    Family History  Problem Relation Age of Onset  . Diabetes Mother   . Heart disease Mother   . Cancer Mother   . CAD Mother   . Heart disease  Father   . CAD Father   . Diabetes Sister   . Emphysema Maternal Grandfather   . Diabetes Paternal Grandmother   . Cancer Paternal Grandfather     Social History   Social History  . Marital status: Married    Spouse name: N/A  . Number of children: N/A  . Years of education: N/A   Occupational History  . Not on file.   Social History Main Topics  . Smoking status: Never Smoker  . Smokeless tobacco: Never Used  . Alcohol use No  . Drug use: No  . Sexual activity: Yes   Other Topics Concern  . Not on file   Social History Narrative  . No narrative on file    Review of Systems: See HPI, otherwise negative ROS  Physical Exam: BP 125/87   Pulse (!) 133   Temp (!) 97.4 F (36.3 C) (Tympanic)   Resp 18   Ht 5\' 8"  (1.727 m)   Wt 64.4 kg (142 lb)   SpO2 99%   BMI 21.59 kg/m  General:   Alert,  pleasant and cooperative in NAD Head:  Normocephalic and atraumatic. Neck:  Supple; no masses or thyromegaly. Lungs:  Clear throughout to auscultation.    Heart:  Regular rate and rhythm. Abdomen:  Soft, nontender and nondistended. Normal bowel sounds, without guarding, and without rebound.   Neurologic:  Alert and  oriented x4;  grossly normal neurologically.  Impression/Plan: Jeffrey Velazquez is here for an colonoscopy to be performed for abdominal pain and weight loss.  Risks, benefits, limitations, and alternatives regarding  colonoscopy have been reviewed with the patient.  Questions have been answered.  All parties agreeable.   Lynnae PrudeELLIOTT, Mahima Hottle, MD  11/19/2016, 7:30 AM

## 2016-11-20 LAB — SURGICAL PATHOLOGY

## 2016-11-24 ENCOUNTER — Encounter: Payer: Self-pay | Admitting: Unknown Physician Specialty

## 2017-02-19 ENCOUNTER — Emergency Department
Admission: EM | Admit: 2017-02-19 | Discharge: 2017-02-20 | Disposition: A | Discharge status: Other Acute Inpatient Hospital | Payer: 59 | Attending: Emergency Medicine | Admitting: Emergency Medicine

## 2017-02-19 ENCOUNTER — Encounter: Payer: Self-pay | Admitting: Emergency Medicine

## 2017-02-19 ENCOUNTER — Emergency Department: Payer: 59

## 2017-02-19 DIAGNOSIS — F339 Major depressive disorder, recurrent, unspecified: Secondary | ICD-10-CM | POA: Insufficient documentation

## 2017-02-19 DIAGNOSIS — F322 Major depressive disorder, single episode, severe without psychotic features: Secondary | ICD-10-CM

## 2017-02-19 DIAGNOSIS — E119 Type 2 diabetes mellitus without complications: Secondary | ICD-10-CM | POA: Insufficient documentation

## 2017-02-19 DIAGNOSIS — F419 Anxiety disorder, unspecified: Secondary | ICD-10-CM

## 2017-02-19 DIAGNOSIS — R0789 Other chest pain: Secondary | ICD-10-CM | POA: Diagnosis present

## 2017-02-19 DIAGNOSIS — R0602 Shortness of breath: Secondary | ICD-10-CM | POA: Diagnosis present

## 2017-02-19 DIAGNOSIS — R079 Chest pain, unspecified: Secondary | ICD-10-CM

## 2017-02-19 LAB — BASIC METABOLIC PANEL
Anion gap: 11 (ref 5–15)
BUN: 13 mg/dL (ref 6–20)
CO2: 23 mmol/L (ref 22–32)
Calcium: 9.5 mg/dL (ref 8.9–10.3)
Chloride: 108 mmol/L (ref 101–111)
Creatinine, Ser: 0.83 mg/dL (ref 0.61–1.24)
GFR calc Af Amer: >60 mL/min (ref >60)
GFR calc non Af Amer: >60 mL/min (ref >60)
Glucose, Bld: 72 mg/dL (ref 65–99)
Potassium: 3.2 mmol/L — ABNORMAL LOW (ref 3.5–5.1)
Sodium: 142 mmol/L (ref 135–145)

## 2017-02-19 LAB — CBC
HCT: 37.2 % — ABNORMAL LOW (ref 40.0–52.0)
Hemoglobin: 13.5 g/dL (ref 13.0–18.0)
MCH: 32.6 pg (ref 26.0–34.0)
MCHC: 36.3 g/dL — ABNORMAL HIGH (ref 32.0–36.0)
MCV: 89.8 fL (ref 80.0–100.0)
Platelets: 260 10*3/uL (ref 150–440)
RBC: 4.14 MIL/uL — ABNORMAL LOW (ref 4.40–5.90)
RDW: 13.1 % (ref 11.5–14.5)
WBC: 9.4 10*3/uL (ref 3.8–10.6)

## 2017-02-19 LAB — TROPONIN I: Troponin I: <0.03 ng/mL (ref <0.03)

## 2017-02-19 MED ORDER — OLANZAPINE 5 MG PO TABS
2.5000 mg | ORAL_TABLET | Freq: Every day | ORAL | Status: DC
  Administered 2017-02-19: 2.5 mg via ORAL
  Filled 2017-02-19: qty 1

## 2017-02-19 MED ORDER — DIAZEPAM 5 MG PO TABS
10.0000 mg | ORAL_TABLET | Freq: Once | ORAL | Status: AC
  Administered 2017-02-19: 10 mg via ORAL
  Filled 2017-02-19: qty 2

## 2017-02-19 MED ORDER — LORAZEPAM 2 MG/ML IJ SOLN: 1.0000 mg | Freq: Once | INTRAMUSCULAR | Status: DC

## 2017-02-19 MED ORDER — LORAZEPAM 2 MG PO TABS
2.0000 mg | ORAL_TABLET | ORAL | Status: DC | PRN
  Administered 2017-02-19: 2 mg via ORAL
  Filled 2017-02-19: qty 1

## 2017-02-19 MED ORDER — FLUOXETINE HCL 20 MG PO CAPS
20.0000 mg | ORAL_CAPSULE | Freq: Every day | ORAL | Status: DC
  Administered 2017-02-19: 20 mg via ORAL
  Filled 2017-02-19: qty 1

## 2017-02-19 NOTE — ED Triage Notes (Signed)
Pt to the ED via POV for chest pain and Shortness of breath. Pt states that this has been going on for several days. Pt states that the pain is generalized chest pain and abdominal pain. Pt family member states that pts PCP wanted pt to be admitted for anxiety if his work up came back negative.

## 2017-02-19 NOTE — ED Provider Notes (Signed)
[facility] Emergency Department Provider Note       Time seen: ----------------------------------------- 7:35 PM on 02/19/2017 -----------------------------------------     I have reviewed the triage vital signs and the nursing notes.   HISTORY   Chief Complaint Chest Pain    HPI Jeffrey Velazquez is a 56 y.o. male who presents to the ED for chest pain shortness of breath. Patient states his been going on for several days. Patient states the pain is generalized in the chest and abdomen. Family member states that primary care doctor wanted the patient to be admitted for anxiety if his workup came back negative. reportedly the patient has been under extreme stress over the past 6-8 months. Patient states he's seen a gastroenterologist, has had endoscopy, colonoscopy and numerous CT imaging events without any specific diagnosis.  Past Medical History:  Diagnosis Date  . Anemia   . Anxiety   . Depression   . Diabetes mellitus without complication (HCC)   . DOE (dyspnea on exertion)   . Early satiety   . Epigastric abdominal pain   . GERD (gastroesophageal reflux disease)   . Hyperlipidemia   . Irritable bowel syndrome   . Shingles   . Tachycardia   . Unintended weight loss     Patient Active Problem List   Diagnosis Date Noted  . Chest pain 10/29/2016  . Sinus tachycardia 10/29/2016  . Hyponatremia 10/29/2016  . Hypokalemia 10/29/2016    Past Surgical History:  Procedure Laterality Date  . COLONOSCOPY WITH PROPOFOL N/A 11/19/2016   Procedure: COLONOSCOPY WITH PROPOFOL;  Surgeon: Scot Jun, MD;  Location: [facility] ENDOSCOPY;  Service: Endoscopy;  Laterality: N/A;  . ESOPHAGOGASTRODUODENOSCOPY (EGD) WITH PROPOFOL N/A 11/06/2016   Procedure: ESOPHAGOGASTRODUODENOSCOPY (EGD) WITH PROPOFOL;  Surgeon: Scot Jun, MD;  Location: [facility] ENDOSCOPY;  Service: Endoscopy;  Laterality: N/A;  . FRACTURE SURGERY Left 2010   Clavicle  . TONSILLECTOMY     . TOOTH EXTRACTION     wisdom teeth x 4    Allergies Codeine  Social History Social History  Substance Use Topics  . Smoking status: Never Smoker  . Smokeless tobacco: Never Used  . Alcohol use No    Review of Systems Constitutional: Negative for fever. Eyes: Negative for vision changes ENT:  Negative for congestion, sore throat Cardiovascular: positive for chest pain Respiratory: positive for shortness of breath Gastrointestinal: positive for abdominal pain and anorexia Genitourinary: Negative for dysuria. Musculoskeletal: Negative for back pain. Skin: Negative for rash. Neurological: Negative for headaches, focal weakness or numbness. Psychiatric: positive for anxiety  All systems negative/normal/unremarkable except as stated in the HPI  ____________________________________________   PHYSICAL EXAM:  VITAL SIGNS: ED Triage Vitals  Enc Vitals Group     BP 02/19/17 1735 96/66     Pulse Rate 02/19/17 1735 (!) 102     Resp 02/19/17 1735 16     Temp 02/19/17 1735 98.2 F (36.8 C)     Temp Source 02/19/17 1735 Oral     SpO2 02/19/17 1735 100 %     Weight 02/19/17 1736 142 lb (64.4 kg)     Height 02/19/17 1736  (1.727 m)     Head Circumference --      Peak Flow --      Pain Score 02/19/17 1735 9     Pain Loc --      Pain Edu? --      Excl. in GC? --    Constitutional: Alert and oriented.  anxious, no distress Eyes: Conjunctivae are normal. Normal extraocular movements. ENT   Head: Normocephalic and atraumatic.   Nose: No congestion/rhinnorhea.   Mouth/Throat: Mucous membranes are moist.   Neck: No stridor. Cardiovascular: Normal rate, regular rhythm. No murmurs, rubs, or gallops. Respiratory: Normal respiratory effort without tachypnea nor retractions. Breath sounds are clear and equal bilaterally. No wheezes/rales/rhonchi. Gastrointestinal: Soft and non-focally tender. Normal bowel sounds. Musculoskeletal: Nontender with normal range of  motion in extremities. No lower extremity tenderness nor edema. Neurologic:  Normal speech and language. No gross focal neurologic deficits are appreciated.  Skin:  Skin is warm, dry and intact. No rash noted. Psychiatric: Mood and affect are normal. Speech and behavior are normal.  ____________________________________________  EKG: Interpreted by me.sinus tachycardia with a rate of 103 bpm, normal PR interval, normal QRS, normal QT.  ____________________________________________  ED COURSE:  Pertinent labs & imaging results that were available during my care of the patient were reviewed by me and considered in my medical decision making (see chart for details). Patient presents for chest and abdominal pain, we will assess with labs and imaging as indicated.   Procedures ____________________________________________   LABS (pertinent positives/negatives)  Labs Reviewed  BASIC METABOLIC PANEL - Abnormal; Notable for the following:       Result Value   Potassium 3.2 (*)    All other components within normal limits  CBC - Abnormal; Notable for the following:    RBC 4.14 (*)    HCT 37.2 (*)    MCHC 36.3 (*)    All other components within normal limits  TROPONIN I    RADIOLOGY chest x-ray is normal  ____________________________________________  DIFFERENTIAL DIAGNOSIS   angina, gastroenteritis, anxiety, AAA, peptic ulcer disease, GERD, colitis, inflammatory bowel disease, irritable bowel syndrome   FINAL ASSESSMENT AND PLAN  chest pain, anxiety  Plan: Patient's labs and imaging were dictated above. Patient had presented for chest pain and diffuse pain rather that seems to be likely some anxiety related. He has had extensive workups for this in the past. We have given him Valium with improvement in his symptoms and consult to Tele-psychiatry.   Emily Filbert, MD   Note: This note was generated in part or whole with voice recognition software. Voice recognition is  usually quite accurate but there are transcription errors that can and very often do occur. I apologize for any typographical errors that were not detected and corrected.     Emily Filbert, MD 02/19/17 2030

## 2017-02-19 NOTE — ED Provider Notes (Signed)
patient has been evaluated by Tele-psychiatry who recommends inpatient psychiatric evaluation and treatment for major depression without psychotic features. They recommended starting him on Zyprexa, Prozac, lorazepam and his home medicines. They've also recommended social services consult.   Emily Filbert, MD 02/19/17 2203

## 2017-02-20 ENCOUNTER — Inpatient Hospital Stay
Admission: RE | Admit: 2017-02-20 | Discharge: 2017-03-02 | DRG: 885 | Disposition: A | Discharge status: Home / Self Care | Payer: 59 | Source: Intra-hospital | Attending: Psychiatry | Admitting: Psychiatry

## 2017-02-20 DIAGNOSIS — K219 Gastro-esophageal reflux disease without esophagitis: Secondary | ICD-10-CM | POA: Diagnosis present

## 2017-02-20 DIAGNOSIS — Z7984 Long term (current) use of oral hypoglycemic drugs: Secondary | ICD-10-CM

## 2017-02-20 DIAGNOSIS — Z599 Problem related to housing and economic circumstances, unspecified: Secondary | ICD-10-CM

## 2017-02-20 DIAGNOSIS — F332 Major depressive disorder, recurrent severe without psychotic features: Principal | ICD-10-CM | POA: Diagnosis present

## 2017-02-20 DIAGNOSIS — G8929 Other chronic pain: Secondary | ICD-10-CM | POA: Diagnosis present

## 2017-02-20 DIAGNOSIS — M792 Neuralgia and neuritis, unspecified: Secondary | ICD-10-CM | POA: Diagnosis present

## 2017-02-20 DIAGNOSIS — Z7982 Long term (current) use of aspirin: Secondary | ICD-10-CM

## 2017-02-20 DIAGNOSIS — Z809 Family history of malignant neoplasm, unspecified: Secondary | ICD-10-CM

## 2017-02-20 DIAGNOSIS — E538 Deficiency of other specified B group vitamins: Secondary | ICD-10-CM | POA: Diagnosis present

## 2017-02-20 DIAGNOSIS — F333 Major depressive disorder, recurrent, severe with psychotic symptoms: Secondary | ICD-10-CM | POA: Diagnosis present

## 2017-02-20 DIAGNOSIS — Z23 Encounter for immunization: Secondary | ICD-10-CM | POA: Diagnosis not present

## 2017-02-20 DIAGNOSIS — R634 Abnormal weight loss: Secondary | ICD-10-CM | POA: Diagnosis present

## 2017-02-20 DIAGNOSIS — Z885 Allergy status to narcotic agent status: Secondary | ICD-10-CM | POA: Diagnosis not present

## 2017-02-20 DIAGNOSIS — Z653 Problems related to other legal circumstances: Secondary | ICD-10-CM | POA: Diagnosis not present

## 2017-02-20 DIAGNOSIS — Z639 Problem related to primary support group, unspecified: Secondary | ICD-10-CM | POA: Diagnosis not present

## 2017-02-20 DIAGNOSIS — F5105 Insomnia due to other mental disorder: Secondary | ICD-10-CM

## 2017-02-20 DIAGNOSIS — Z8249 Family history of ischemic heart disease and other diseases of the circulatory system: Secondary | ICD-10-CM | POA: Diagnosis not present

## 2017-02-20 DIAGNOSIS — I10 Essential (primary) hypertension: Secondary | ICD-10-CM | POA: Diagnosis present

## 2017-02-20 DIAGNOSIS — Z79899 Other long term (current) drug therapy: Secondary | ICD-10-CM

## 2017-02-20 DIAGNOSIS — E785 Hyperlipidemia, unspecified: Secondary | ICD-10-CM | POA: Diagnosis present

## 2017-02-20 DIAGNOSIS — E119 Type 2 diabetes mellitus without complications: Secondary | ICD-10-CM

## 2017-02-20 DIAGNOSIS — Z825 Family history of asthma and other chronic lower respiratory diseases: Secondary | ICD-10-CM | POA: Diagnosis not present

## 2017-02-20 DIAGNOSIS — R45851 Suicidal ideations: Secondary | ICD-10-CM | POA: Diagnosis present

## 2017-02-20 DIAGNOSIS — F322 Major depressive disorder, single episode, severe without psychotic features: Secondary | ICD-10-CM

## 2017-02-20 DIAGNOSIS — Z833 Family history of diabetes mellitus: Secondary | ICD-10-CM | POA: Diagnosis not present

## 2017-02-20 LAB — LIPID PANEL
Cholesterol: 158 mg/dL (ref 0–200)
HDL: 45 mg/dL (ref >40)
LDL Cholesterol: 70 mg/dL (ref 0–99)
Total CHOL/HDL Ratio: 3.5 RATIO
Triglycerides: 215 mg/dL — ABNORMAL HIGH (ref <150)
VLDL: 43 mg/dL — ABNORMAL HIGH (ref 0–40)

## 2017-02-20 LAB — TSH: TSH: 2.568 u[IU]/mL (ref 0.350–4.500)

## 2017-02-20 LAB — CARBAMAZEPINE LEVEL, TOTAL: Carbamazepine Lvl: 2.8 ug/mL — ABNORMAL LOW (ref 4.0–12.0)

## 2017-02-20 MED ORDER — ASPIRIN EC 81 MG PO TBEC
81.0000 mg | DELAYED_RELEASE_TABLET | Freq: Every day | ORAL | Status: DC
  Administered 2017-02-20 – 2017-03-02 (×11): 81 mg via ORAL
  Filled 2017-02-20 (×11): qty 1

## 2017-02-20 MED ORDER — ACETAMINOPHEN 325 MG PO TABS: 650.0000 mg | ORAL_TABLET | Freq: Four times a day (QID) | ORAL | Status: DC | PRN

## 2017-02-20 MED ORDER — ADULT MULTIVITAMIN W/MINERALS CH
1.0000 | ORAL_TABLET | Freq: Every day | ORAL | Status: DC
  Administered 2017-02-20 – 2017-03-02 (×11): 1 via ORAL
  Filled 2017-02-20 (×11): qty 1

## 2017-02-20 MED ORDER — GABAPENTIN 300 MG PO CAPS: 600.0000 mg | ORAL_CAPSULE | Freq: Three times a day (TID) | ORAL | Status: DC

## 2017-02-20 MED ORDER — FOLIC ACID 1 MG PO TABS
0.5000 mg | ORAL_TABLET | Freq: Every day | ORAL | Status: DC
  Administered 2017-02-20 – 2017-03-02 (×11): 0.5 mg via ORAL
  Filled 2017-02-20 (×11): qty 1

## 2017-02-20 MED ORDER — SUCRALFATE 1 G PO TABS
1.0000 g | ORAL_TABLET | Freq: Three times a day (TID) | ORAL | Status: DC
  Administered 2017-02-20 – 2017-03-02 (×40): 1 g via ORAL
  Filled 2017-02-20 (×43): qty 1

## 2017-02-20 MED ORDER — PANTOPRAZOLE SODIUM 40 MG PO TBEC
40.0000 mg | DELAYED_RELEASE_TABLET | Freq: Every day | ORAL | Status: DC
  Administered 2017-02-20 – 2017-03-02 (×11): 40 mg via ORAL
  Filled 2017-02-20 (×11): qty 1

## 2017-02-20 MED ORDER — VITAMIN D 1000 UNITS PO TABS
5000.0000 [IU] | ORAL_TABLET | Freq: Every day | ORAL | Status: DC
  Administered 2017-02-20 – 2017-03-02 (×11): 5000 [IU] via ORAL
  Filled 2017-02-20 (×11): qty 5

## 2017-02-20 MED ORDER — DULOXETINE HCL 30 MG PO CPEP
60.0000 mg | ORAL_CAPSULE | Freq: Every day | ORAL | Status: DC
  Administered 2017-02-20 – 2017-02-24 (×5): 60 mg via ORAL
  Filled 2017-02-20 (×5): qty 2

## 2017-02-20 MED ORDER — FOLIC ACID 400 MCG PO TABS: 400.0000 ug | ORAL_TABLET | Freq: Every day | ORAL | Status: DC

## 2017-02-20 MED ORDER — GABAPENTIN 300 MG PO CAPS
600.0000 mg | ORAL_CAPSULE | Freq: Three times a day (TID) | ORAL | Status: DC
  Administered 2017-02-20 – 2017-03-02 (×30): 600 mg via ORAL
  Filled 2017-02-20 (×31): qty 2

## 2017-02-20 MED ORDER — METOPROLOL TARTRATE 25 MG PO TABS
12.5000 mg | ORAL_TABLET | Freq: Two times a day (BID) | ORAL | Status: DC
  Administered 2017-02-20 – 2017-03-02 (×21): 12.5 mg via ORAL
  Filled 2017-02-20 (×21): qty 1

## 2017-02-20 MED ORDER — GLUCERNA SHAKE PO LIQD
237.0000 mL | Freq: Two times a day (BID) | ORAL | Status: DC
  Administered 2017-02-21 – 2017-03-01 (×15): 237 mL via ORAL

## 2017-02-20 MED ORDER — VITAMIN B-12 100 MCG PO TABS
100.0000 ug | ORAL_TABLET | Freq: Every day | ORAL | Status: DC
  Administered 2017-02-20 – 2017-02-24 (×5): 100 ug via ORAL
  Filled 2017-02-20 (×5): qty 1

## 2017-02-20 MED ORDER — ATORVASTATIN CALCIUM 20 MG PO TABS
20.0000 mg | ORAL_TABLET | Freq: Every day | ORAL | Status: DC
  Administered 2017-02-20 – 2017-03-02 (×11): 20 mg via ORAL
  Filled 2017-02-20 (×11): qty 1

## 2017-02-20 MED ORDER — METFORMIN HCL ER 500 MG PO TB24
500.0000 mg | ORAL_TABLET | Freq: Two times a day (BID) | ORAL | Status: DC
  Administered 2017-02-20 – 2017-03-02 (×21): 500 mg via ORAL
  Filled 2017-02-20 (×21): qty 1

## 2017-02-20 MED ORDER — GLIMEPIRIDE 2 MG PO TABS
2.0000 mg | ORAL_TABLET | Freq: Two times a day (BID) | ORAL | Status: DC
  Administered 2017-02-20 – 2017-03-02 (×20): 2 mg via ORAL
  Filled 2017-02-20 (×22): qty 1

## 2017-02-20 MED ORDER — MIRTAZAPINE 15 MG PO TABS
15.0000 mg | ORAL_TABLET | Freq: Every day | ORAL | Status: DC
  Administered 2017-02-20 – 2017-02-25 (×6): 15 mg via ORAL
  Filled 2017-02-20 (×6): qty 1

## 2017-02-20 MED ORDER — OMEGA-3-ACID ETHYL ESTERS 1 G PO CAPS
2.0000 g | ORAL_CAPSULE | Freq: Every day | ORAL | Status: DC
  Administered 2017-02-20 – 2017-03-02 (×11): 2 g via ORAL
  Filled 2017-02-20 (×11): qty 2

## 2017-02-20 MED ORDER — MAGNESIUM HYDROXIDE 400 MG/5ML PO SUSP: 30.0000 mL | Freq: Every day | ORAL | Status: DC | PRN

## 2017-02-20 MED ORDER — ALUM & MAG HYDROXIDE-SIMETH 200-200-20 MG/5ML PO SUSP: 30.0000 mL | ORAL | Status: DC | PRN

## 2017-02-20 MED ORDER — MELATONIN 5 MG PO TABS
10.0000 mg | ORAL_TABLET | Freq: Every day | ORAL | Status: DC
  Filled 2017-02-20: qty 2

## 2017-02-20 MED ORDER — LIDOCAINE 5 % EX PTCH
1.0000 | MEDICATED_PATCH | CUTANEOUS | Status: DC
  Administered 2017-02-20 – 2017-03-01 (×10): 1 via TRANSDERMAL
  Filled 2017-02-20 (×11): qty 1

## 2017-02-20 MED ORDER — CARBAMAZEPINE 200 MG PO TABS
200.0000 mg | ORAL_TABLET | Freq: Two times a day (BID) | ORAL | Status: DC
  Administered 2017-02-20 – 2017-03-02 (×21): 200 mg via ORAL
  Filled 2017-02-20 (×21): qty 1

## 2017-02-20 NOTE — ED Notes (Signed)
Patient is asleep on the stretcher. Rise and fall of the chest noted. Patient is easily aroused when spoken to. Patient does not appear to be in acute distress at this time.   

## 2017-02-20 NOTE — Plan of Care (Signed)
Problem: Coping: Goal: Ability to verbalize frustrations and anger appropriately will improve Outcome: Progressing Verbalizes feelings without difficulty.     
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
## 2017-02-20 NOTE — Progress Notes (Signed)
Admitted this morning to the unit for MDD and severe stress due to lose of job and income potential coupled with bad health, patient is alert and oriented X 4, cooperating with assessments and contract safety with care staff. Patient appear to have difficulty maintaining balance and sleepy assist patient to bed, vital signs are stable bp 145/76 pulse 120 resp 18 temp. 97.4 02sats 100% on room air, patient will be monitored and informed to call for assistance at all times and ADLs noted
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
## 2017-02-20 NOTE — Tx Team (Signed)
Initial Treatment Plan 02/20/2017 4:04 AM Jeffrey Velazquez [id number]    PATIENT STRESSORS: Financial difficulties   PATIENT STRENGTHS: Capable of independent living   PATIENT IDENTIFIED PROBLEMS:                      DISCHARGE CRITERIA:  Motivation to continue treatment in a less acute level of care  PRELIMINARY DISCHARGE PLAN: Return to previous living arrangement  PATIENT/FAMILY INVOLVEMENT: This treatment plan has been presented to and reviewed with the patient, Jeffrey Velazquez.  The patient  have been given the opportunity to ask questions and make suggestions.  Lelan Pons, RN 02/20/2017, 4:04 AM

## 2017-02-20 NOTE — ED Notes (Signed)
Pt discharged to Behavioral Medicine at [facility]; report called to Trinna Post, RN on the unit. All of the patient's belongings are going with him to the unit. Pt states only having the clothing he is wearing and his shoes with him at this time. Pt is awake, alert and oriented x4. Pt is ambulatory and displays a steady, even gait.
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
## 2017-02-21 ENCOUNTER — Encounter: Payer: Self-pay | Admitting: Psychiatry

## 2017-02-21 DIAGNOSIS — K219 Gastro-esophageal reflux disease without esophagitis: Secondary | ICD-10-CM | POA: Diagnosis present

## 2017-02-21 DIAGNOSIS — E119 Type 2 diabetes mellitus without complications: Secondary | ICD-10-CM

## 2017-02-21 DIAGNOSIS — E538 Deficiency of other specified B group vitamins: Secondary | ICD-10-CM | POA: Diagnosis present

## 2017-02-21 DIAGNOSIS — I10 Essential (primary) hypertension: Secondary | ICD-10-CM | POA: Diagnosis present

## 2017-02-21 LAB — VITAMIN B12: Vitamin B-12: 242 pg/mL (ref 180–914)

## 2017-02-21 LAB — FOLATE: Folate: 13.4 ng/mL (ref >5.9)

## 2017-02-21 LAB — HEMOGLOBIN A1C
Hgb A1c MFr Bld: 6.5 % — ABNORMAL HIGH (ref 4.8–5.6)
Mean Plasma Glucose: 139.85 mg/dL

## 2017-02-21 MED ORDER — PREMIER PROTEIN SHAKE
11.0000 [oz_av] | ORAL | Status: DC
  Administered 2017-02-21 – 2017-02-28 (×8): 11 [oz_av] via ORAL

## 2017-02-21 NOTE — [facility] Group Notes (Signed)
[facility] Group Notes:  (Nursing/MHT/Case Management/Adjunct)  Date:  02/21/2017  Time:  9:37 PM  Type of Therapy:  Group Therapy  Participation Level:  Active  Participation Quality:  Appropriate  Affect:  Appropriate  Cognitive:  Appropriate  Insight:  Good  Engagement in Group:  Engaged  Modes of Intervention:  Education and Support  Summary of Progress/Problems:  Balian Schaller A [place] Lusty 02/21/2017, 9:37 PM

## 2017-02-21 NOTE — [facility] Suicide Risk Assessment (Signed)
[facility] INPATIENT:  Family/Significant Other Suicide Prevention Education  Suicide Prevention Education:  Education Completed; Saliou Barnier, Pt's wife [phone], has been identified by the patient as the family member/significant other with whom the patient will be residing, and identified as the person(s) who will aid the patient in the event of a mental health crisis (suicidal ideations/suicide attempt).  With written consent from the patient, the family member/significant other has been provided the following suicide prevention education, prior to the and/or following the discharge of the patient.  The suicide prevention education provided includes the following:  Suicide risk factors  Suicide prevention and interventions  National Suicide Hotline telephone number  [facility] assessment telephone number  [facility] Emergency Assistance 911  [facility] and/or Residential Mobile Crisis Unit telephone number  Request made of family/significant other to:  Remove weapons (e.g., guns, rifles, knives), all items previously/currently identified as safety concern.    Remove drugs/medications (over-the-counter, prescriptions, illicit drugs), all items previously/currently identified as a safety concern.  The family member/significant other verbalizes understanding of the suicide prevention education information provided.  The family member/significant other agrees to remove the items of safety concern listed above.  Pt's wife reports that she feels that the loss of Pt's parents 10 years ago, within months of each other, is a significant issue. Further, there has been chaos between their daughter-in-law for the last 31yrs with limited contact with their grandchildren. Per wife, Pt has had extreme levels of stress and anxiety related to all of the stress. Pt has lost a significant amount of weight. Financial stress is a major concern as they are having to figure out where  to move and have limited resources.  Verdene Lennert 02/21/2017, 12:10 PM

## 2017-02-21 NOTE — Progress Notes (Signed)
NUTRITION ASSESSMENT  Pt identified as at risk on the Malnutrition Screen Tool  INTERVENTION: 1. Educated patient on the importance of nutrition and encouraged intake of food and beverages. 2. Discussed weight goals. 3. Supplements: Patient currently ordered for Glucerna Shake po BID, each supplement provides 220 kcal and 10 grams of protein. Also recommend Premier Protein po once daily, each supplement provides 160 kcal and 30 grams of protein.  NUTRITION DIAGNOSIS: Unintentional weight loss related to sub-optimal intake as evidenced by pt report.   Goal: Pt to meet >/= 90% of their estimated nutrition needs.  Monitor:  PO intake  Assessment:  56 y.o. male with PMHx of DM type 2, HLD, anemia, anxiety, depression, shingles, early satiety, epigastric abdominal pain, GERD, IBS now admitted for MDD.  Patient reports he has had a poor appetite since December of 2017. He had some issues with gastric emptying and abdominal pain. He reports is was unknown whether it was related to his diabetes or possibly shingles. For a period of time, he had delayed gastric emptying with nausea, so he experienced early satiety and difficulty eating foods (would get full after 1/2 slice of pizza or a bottle of water). He reports that due to his difficulty eating he subsequently lost from around 180 lbs to around 120-130 lbs. Once he went to see a GI doctor, he reports his symptoms resolved on their own. By the time he had his gastric emptying study, he reports she results were normal. Reports he is eating better now and has started to gain some weight back. Denies any current N/V or abdominal pain.   Per review of weight history in chart patient was 177.6 lbs on 04/14/2016, 167.8 lbs on 06/22/2016, 161.8 lbs on 09/01/2016, 154.4 lbs on 10/13/2016, 149.8 lbs on 11/02/2016, 138.6 lbs on 12/10/2016. He lost 39 lbs (22% body weight) over 8 months, which is significant for time frame.  RD suspects patient is malnourished,  however, patient unable to provide exact details on intake, and unable to complete NFPE.   Height: Ht Readings from Last 1 Encounters:  02/20/17  (1.727 m)    Weight: Wt Readings from Last 1 Encounters:  02/20/17 138 lb (62.6 kg)    Weight Hx: Wt Readings from Last 10 Encounters:  02/20/17 138 lb (62.6 kg)  02/19/17 142 lb (64.4 kg)  11/19/16 142 lb (64.4 kg)  11/06/16 147 lb (66.7 kg)  10/30/16 147 lb 4.8 oz (66.8 kg)  10/15/16 152 lb (68.9 kg)  10/07/16 152 lb (68.9 kg)  04/23/16 172 lb (78 kg)  04/23/16 172 lb (78 kg)  04/20/16 177 lb (80.3 kg)    BMI:  Body mass index is 20.98 kg/m. Pt meets criteria for normal weight based on current BMI.  Estimated Nutritional Needs: Kcal: 25-30 kcal/kg Protein: > 1 gram protein/kg Fluid: 1 ml/kcal  Diet Order: Diet Carb Modified Fluid consistency: Thin; Room service appropriate? Yes Pt is also offered choice of unit snacks mid-morning and mid-afternoon.  Pt is eating as desired.   Lab results and medications reviewed.   Helane Rima, MS, RD, LDN Office: [phone] Pager: [phone] After Hours/Weekend Pager: [phone]

## 2017-02-21 NOTE — [facility] Group Notes (Signed)
[facility] Group Notes:  (Nursing/MHT/Case Management/Adjunct)  Date:  02/21/2017  Time:  5:04 AM  Type of Therapy:  Psychoeducational Skills  Participation Level:  Did Not Attend  Summary of Progress/Problems:  Jeffrey Velazquez 02/21/2017, 5:04 AM

## 2017-02-21 NOTE — [facility] Group Notes (Signed)
[facility] LCSW Group Therapy 02/21/2017 1:15pm  Type of Therapy: Group Therapy- Feelings Around Discharge & Establishing a Supportive Framework  Participation Level:  Did Not Attend  Description of Group:   What is a supportive framework? What does it look like feel like and how do I discern it from and unhealthy non-supportive network? Learn how to cope when supports are not helpful and don't support you. Discuss what to do when your family/friends are not supportive.    Therapeutic Modalities:   Cognitive Behavioral Therapy Person-Centered Therapy Motivational Interviewing   Verdene Lennert, LCSW 02/21/2017 12:59 PM

## 2017-02-21 NOTE — Progress Notes (Signed)
[facility] MD Progress Note  02/21/2017 3:40 PM Jeffrey Velazquez  MRN:  [id number]   Subjective:  History of Present Illness:   Jeffrey Velazquez is a 56 year old Caucasian male with a 4 month history of worsening depressive symptoms that in June of this year who was referred to the emergency room secondary to worsening depressive symptoms. No suicidal thoughts.  02/22/17 The patient says that he did sleep better last night after taking Remeron. Nursing reported 8 hours of sleep. Appetite is still low this morning but he is drinking protein shakes. Anxiety is high and the patient worries about where he and his wife will live once the house is foreclosed upon. He has been isolating himself in his room today and is not socializing with other patients. He denies any current active or passive suicidal thoughts but affect remains depressed. He denies any auditory or visual hallucinations. He denies any paranoid thoughts or delusions. She denies any somatic complaints. Vital signs are stable. So far he is tolerating the medications fairly well and denies any physical adverse side effects associated with psychotropic medications. Supportive psychotherapy provided and Times in helping patient to try, with alternative living situations. He is considering moving to a mobile home after discharge. Met with his wife yesterday when she visited and she assured this Probation officer that she has removed the guns from his possession.  Past Psychiatric History The patient denies any prior inpatient psychiatric hospitalizations or suicide attempts. He has been seeing a therapist, Jeffrey Velazquez, for the past 3 months. He has been on Cymbalta 60 mg by mouth daily prescribed him by his severe. He has not ever seen a psychiatrist in the past.  Family Psychiatric History  The patient denies any history of any mental illness or substance family   Substance Abuse History He denies any history of any heavy alcohol use or abuse.   Social  History:  the patient was born and raised in the [place] area by both his biological parents. He says his parents never separated or divorced. His parents are both deceased now but he does have 4 siblings in the area. He denies any history of any physical or sexual abuse. He has been married now for over 58 years and has 2 sons and 2 foster daughters. The patient has worked at [employer] for over 20 years but is on [employer].  Legal History:  The patient does report a history of being arrested but assault charges were dismissed   Principal Problem: Major Depression  Diagnosis:   Patient Active Problem List   Diagnosis Date Noted  . Major depressive disorder, recurrent episode, severe ([place]) [F33.2] 02/20/2017  . 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 Major depressive disorder, single episode, severe ([place]) [F32.2]   . Chest pain [R07.9] 10/29/2016  . Sinus tachycardia [R00.0] 10/29/2016  . Hyponatremia [E87.1] 10/29/2016  . Hypokalemia [E87.6] 10/29/2016   Total Time spent with patient: 20 minutes   Past Medical History:  Past Medical History:  Diagnosis Date  . Anemia   . Anxiety   . Depression   . Diabetes mellitus without complication ([place])   . DOE (dyspnea on exertion)   . Early satiety   . Epigastric abdominal pain   . GERD (gastroesophageal reflux disease)   . Hyperlipidemia   . Irritable bowel syndrome   . Shingles   . Tachycardia   . Unintended weight loss     Past Surgical History:  Procedure Laterality Date  . COLONOSCOPY WITH PROPOFOL  N/A 11/19/2016   Procedure: COLONOSCOPY WITH PROPOFOL;  Surgeon: Manya Silvas, MD;  [facility] END[facility]COPY;  Service: Endoscopy;  Laterality: N/A;  . ESOPHAGOGASTRODUODENOSCOPY (EGD) WITH PROPOFOL N/A 11/06/2016   Procedure: ESOPHAGOGASTRODUODENOSCOPY (EGD) WITH PROPOFOL;  Surgeon: Manya Silvas, MD;  Location: [facility] ENDOSCOPY;  Service: Endoscopy;  Laterality: N/A;  . FRACTURE SURGERY Left 2010   Clavicle  .  TONSILLECTOMY    . TOOTH EXTRACTION     wisdom teeth x 4   Family History:  Family History  Problem Relation Age of Onset  . Diabetes Mother   . Heart disease Mother   . Cancer Mother   . CAD Mother   . Heart disease Father   . CAD Father   . Diabetes Sister   . Emphysema Maternal Grandfather   . Diabetes Paternal Grandmother   . Cancer Paternal Grandfather     Social History:  History  Alcohol Use No     History  Drug Use No    Social History   Social History  . Marital status: Married    Spouse name: N/A  . Number of children: N/A  . Years of education: N/A   Social History Main Topics  . Smoking status: Never Smoker  . Smokeless tobacco: Never Used  . Alcohol use No  . Drug use: No  . Sexual activity: Yes   Other Topics Concern  . None   Social History Narrative  . None   Additional Social History:    Pain Medications: See PTA Prescriptions: See PTA Over the Counter: See PTA History of alcohol / drug use?: No history of alcohol / drug abuse    Sleep: Good  Appetite:  Fair  Current Medications: Current Facility-Administered Medications  Medication Dose Route Frequency Provider Last Rate Last Dose  . acetaminophen (TYLENOL) tablet 650 mg  650 mg Oral Q6H PRN Chauncey Mann, MD      . alum & mag hydroxide-simeth (MAALOX/MYLANTA) 200-200-20 MG/5ML suspension 30 mL  30 mL Oral Q4H PRN Chauncey Mann, MD      . aspirin EC tablet 81 mg  81 mg Oral Daily Chauncey Mann, MD   81 mg at 02/21/17 0957  . atorvastatin (LIPITOR) tablet 20 mg  20 mg Oral Daily Chauncey Mann, MD   20 mg at 02/21/17 0957  . carbamazepine (TEGRETOL) tablet 200 mg  200 mg Oral BID Chauncey Mann, MD   200 mg at 02/21/17 0957  . cholecalciferol (VITAMIN D) tablet 5,000 Units  5,000 Units Oral Daily Chauncey Mann, MD   5,000 Units at 02/21/17 0955  . DULoxetine (CYMBALTA) DR capsule 60 mg  60 mg Oral Daily Chauncey Mann, MD   60 mg at 02/21/17 0954  . feeding supplement (GLUCERNA  SHAKE) (GLUCERNA SHAKE) liquid 237 mL  237 mL Oral BID BM Chauncey Mann, MD   237 mL at 02/21/17 1400  . folic acid (FOLVITE) tablet 0.5 mg  0.5 mg Oral Daily Chauncey Mann, MD   0.5 mg at 02/21/17 0956  . gabapentin (NEURONTIN) capsule 600 mg  600 mg Oral TID Chauncey Mann, MD   600 mg at 02/21/17 1300  . glimepiride (AMARYL) tablet 2 mg  2 mg Oral BID Chauncey Mann, MD   2 mg at 02/21/17 0956  . lidocaine (LIDODERM) 5 % 1 patch  1 patch Transdermal Q24H Chauncey Mann, MD   1 patch at 02/21/17 0957  . magnesium hydroxide (  MILK OF MAGNESIA) suspension 30 mL  30 mL Oral Daily PRN Chauncey Mann, MD      . metFORMIN (GLUCOPHAGE-XR) 24 hr tablet 500 mg  500 mg Oral BID Chauncey Mann, MD   500 mg at 02/21/17 0954  . metoprolol tartrate (LOPRESSOR) tablet 12.5 mg  12.5 mg Oral BID Chauncey Mann, MD   12.5 mg at 02/21/17 0955  . mirtazapine (REMERON) tablet 15 mg  15 mg Oral QHS Chauncey Mann, MD   15 mg at 02/20/17 2117  . multivitamin with minerals tablet 1 tablet  1 tablet Oral Daily Chauncey Mann, MD   1 tablet at 02/21/17 0955  . omega-3 acid ethyl esters (LOVAZA) capsule 2 g  2 g Oral Daily Chauncey Mann, MD   2 g at 02/21/17 0954  . pantoprazole (PROTONIX) EC tablet 40 mg  40 mg Oral Daily Chauncey Mann, MD   40 mg at 02/21/17 0956  . protein supplement (PREMIER PROTEIN) liquid  11 oz Oral Q24H Chauncey Mann, MD      . sucralfate (CARAFATE) tablet 1 g  1 g Oral TID WC & HS Chauncey Mann, MD   1 g at 02/21/17 1300  . vitamin B-12 (CYANOCOBALAMIN) tablet 100 mcg  100 mcg Oral Daily Chauncey Mann, MD   100 mcg at 02/21/17 0932    Lab Results:  Results for orders placed or performed during the hospital encounter of 02/20/17 (from the past 48 hour(s))  Carbamazepine level, total     Status: Abnormal   Collection Time: 02/20/17  7:29 AM  Result Value Ref Range   Carbamazepine Lvl 2.8 (L) 4.0 - 12.0 ug/mL  Hemoglobin A1c     Status: Abnormal   Collection Time: 02/20/17  7:29 AM   Result Value Ref Range   Hgb A1c MFr Bld 6.5 (H) 4.8 - 5.6 %    Comment: (NOTE) Pre diabetes:          5.7%-6.4% Diabetes:              >6.4% Glycemic control for   <7.0% adults with diabetes    Mean Plasma Glucose 139.85 mg/dL    Comment: Performed at [facility], [place] [address], [place], [place] [address]  Lipid panel     Status: Abnormal   Collection Time: 02/20/17  7:29 AM  Result Value Ref Range   Cholesterol 158 0 - 200 mg/dL   Triglycerides 215 (H) <150 mg/dL   HDL 45 >40 mg/dL   Total CHOL/HDL Ratio 3.5 RATIO   VLDL 43 (H) 0 - 40 mg/dL   LDL Cholesterol 70 0 - 99 mg/dL    Comment:        Total Cholesterol/HDL:CHD Risk Coronary Heart Disease Risk Table                     Men   Women  1/2 Average Risk   3.4   3.3  Average Risk       5.0   4.4  2 X Average Risk   9.6   7.1  3 X Average Risk  23.4   11.0        Use the calculated Patient Ratio above and the CHD Risk Table to determine the patient's CHD Risk.        ATP III CLASSIFICATION (LDL):  <100     mg/dL   Optimal  100-129  mg/dL   Near  or Above                    Optimal  130-159  mg/dL   Borderline  160-189  mg/dL   High  >190     mg/dL   Very High   TSH     Status: None   Collection Time: 02/20/17  7:29 AM  Result Value Ref Range   TSH 2.568 0.350 - 4.500 uIU/mL    Comment: Performed by a 3rd Generation assay with a functional sensitivity of <=0.01 uIU/mL.  Folate     Status: None   Collection Time: 02/21/17  7:27 AM  Result Value Ref Range   Folate 13.4 >5.9 ng/mL    Blood Alcohol level:  No results found for: [facility]  Metabolic Disorder Labs: Lab Results  Component Value Date   HGBA1C 6.5 (H) 02/20/2017   MPG 139.85 02/20/2017   MPG 148 10/29/2016   No results found for: PROLACTIN Lab Results  Component Value Date   CHOL 158 02/20/2017   TRIG 215 (H) 02/20/2017   HDL 45 02/20/2017   CHOLHDL 3.5 02/20/2017   VLDL 43 (H) 02/20/2017   LDLCALC 70 02/20/2017     CIWA:  CIWA-Ar  Total: 2 COWS:  COWS Total Score: 2  Musculoskeletal: Strength & Muscle Tone: within normal limits Gait & Station: normal Patient leans: N/A  Psychiatric Specialty Exam: Physical Exam  Psychiatric: His speech is normal. Judgment and thought content normal. His affect is blunt. He is slowed. Cognition and memory are normal. He exhibits a depressed mood.    Review of Systems  Constitutional: Negative.   HENT: Negative.   Eyes: Negative.   Respiratory: Negative.   Cardiovascular: Negative.   Gastrointestinal: Negative.   Genitourinary: Negative.   Musculoskeletal: Negative.   Skin: Negative.   Neurological: Negative.   Endo/Heme/Allergies: Negative.     Blood pressure 105/74, pulse (!) 103, temperature 98 F (36.7 C), temperature source Oral, resp. rate 18, height _0  (1.727 m), weight 62.6 kg (138 lb), SpO2 100 %.Body mass index is 20.98 kg/m.  General Appearance: Disheveled  Eye Contact:  Good  Speech:  Clear and Coherent and Normal Rate  Volume:  Normal  Mood:  Depressed  Affect:  Depressed  Thought Process:  Coherent, Goal Directed and Linear  Orientation:  Full (Time, Place, and Person)  Thought Content:  Logical  Suicidal Thoughts:  No  Homicidal Thoughts:  No  Memory:  Immediate;   Good Recent;   Good Remote;   Good  Judgement:  Good  Insight:  Good  Psychomotor Activity:  Decreased  Concentration:  Concentration: Good and Attention Span: Good  Recall:  Good  Fund of Knowledge:  Good  Language:  Good  Akathisia:  No  Handed:  Right  AIMS (if indicated):     Assets:  Communication Skills Desire for Improvement Physical Health Vocational/Educational  ADL's:  Intact  Cognition:  WNL  Sleep:  Number of Hours: 8     Treatment Plan Summary:   Major Depressive Disorder, Single Episode Hyperlipidemia GERD Post Neurologic Pain  Mr. Gaspard is a 56 year old married Caucasian male with a four-year month history of worsening depressive symptoms that  occurred secondary to a number of psychosocial stressors including financial problems as well as family conflict. He has been on Cymbalta 60 mg by mouth daily prescribed him by his PCP but was struggling with worsening depression and came to the emergency room because he felt he needed admission.  He was denying any active or passive suicidal thoughts but was seen by the tele-psychiatrist felt he needed admission to the hospital. No history of symptoms consistent with bipolar mania.  Major depressive disorder, single episode, severe severe: We'll plan to continue Cymbalta 60 mg by mouth daily and added Remeron 15 mg by mouth nightly for insomnia, depression and to increase appetite. He has lost 60 pounds over the past one year. If no significant improvement with Remeron, we'll consider Seroquel or Abilify to help with depression.Met with the patient's wife yesterday during visitation and she assured this Probation officer that she has removed the guns from his possession. Post neuralgic pain: We'll plan to continue Tegretol 200 mg by mouth twice a day. Tegretol level was 2.8 in the emergency room. No elevation of liver enzymes. Continue Haldol as milligrams by mouth every 6 hours as needed  Diabetes: We'll place him in a diabetic diet. We'll continue glimepiride 2 mg by mouth twice a day and metformin 500 mg by mouth twice a day.  Hypertension: We'll plan to continue metoprolol 12.5 mg by mouth twice a day  Hyperlipidemia: Continue Lipitor 20 mg by mouth daily weight loss/poor nutrition: We'll plan to continue some shakes 2 times daily with meals  GERD: We'll plan to continue omeprazole 40 mg by mouth daily  Weight Loss: We'll plan to continue Glucerna shakes 2 times daily between meals  Chronic pain: We'll plan to continue tramadol 50 mg by mouth 6 hours as needed  Low B12: We'll plan to continue 1000 g of B12 daily. Will check B12 level. B-12 level still pending.  Disposition: The patient will  need psychotropic medication management after discharge. He does have a stable living situation with his wife.   Daily contact with patient to assess and evaluate symptoms and progress in treatment and Medication management   Daily contact with patient to assess and evaluate symptoms and progress in treatment and Medication management  Jay Schlichter, MD 02/21/2017, 3:40 PM

## 2017-02-21 NOTE — Progress Notes (Signed)
Denies SI.  Affect flat.  Verbalizes that feels somewhat better.  Rates depression as 5/10.  Continues to isolate to room.  No group attendance. Medication compliant.  Appetite fair.  Support and encouragement offered.  Safety maintained.

## 2017-02-21 NOTE — BHH Counselor (Signed)
Adult Comprehensive Assessment  Patient ID: Jeffrey Velazquez, male   DOB: 09/28/1960, 56 y.o.   MRN: [id number]  Information Source: Information source: Patient  Current Stressors:  Educational / Learning stressors: None reported Employment / Job issues: Pt has been on [employer] and it has run out; he has not been "fired" but his position is no longer available and they are unsure if a position will be open when he is cleared to work Family Relationships: significant conflict with foster daughters; one who is 18 and just left the home and another who has been placed out of the home Financial / Lack of resources (include bankruptcy): only using their saving at this point; no income at this time Housing / Lack of housing: reports that his home is going into foreclosure Physical health (include injuries & life threatening diseases): chest pain, recently had shingles Social relationships: None reported Substance abuse: Pt denies Bereavement / Loss: both parents are deceased  Living/Environment/Situation:  Living Arrangements: Spouse/significant other Living conditions (as described by patient or guardian): lives at home; home is going into foreclosure How long has patient lived in current situation?: many years What is atmosphere in current home: Supportive, Chaotic (was chaotic due to foster children)  Family History:  Marital status: Married Number of Years Married: 33 What types of issues is patient dealing with in the relationship?: mostly supportive relationship Does patient have children?: Yes How many children?: 4 How is patient's relationship with their children?: 2 adoptive (foster?) children; 2 bio sons---all are out of the home currently; good relationship with two sons  Childhood History:  By whom was/is the patient raised?: Both parents Description of patient's relationship with caregiver when they were a child: good childhood and good relationship with his parents Patient's  description of current relationship with people who raised him/her: both parents are deceased Does patient have siblings?: Yes Number of Siblings: 4 Description of patient's current relationship with siblings: patient is youngest of his siblings; does not have a good relationship with them Did patient suffer any verbal/emotional/physical/sexual abuse as a child?: No Did patient suffer from severe childhood neglect?: No Has patient ever been sexually abused/assaulted/raped as an adolescent or adult?: No Was the patient ever a victim of a crime or a disaster?: Yes Patient description of being a victim of a crime or disaster: father 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; was responsible for getting him to appointments Witnessed domestic violence?: No Has patient been effected by domestic violence as an adult?: No  Education:  Highest grade of school patient has completed: 12 Currently a student?: No Name of school: n/a Solicitor person: n/a Learning disability?: No  Employment/Work Situation:   Employment situation: Employed 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 [employer] and unsure if position will be open when he can return to work How long has patient been employed?: 23 years Patient's job has been impacted by current illness: No What is the longest time patient has a held a job?: 74yrs Where was the patient employed at that time?: current employer Has patient ever been in the [employer]?: No Has patient ever served in combat?: No Did You Receive Any Psychiatric Treatment/Services While in Equities trader?: No Are There Guns or Other Weapons in Your Home?: Yes Types of Guns/Weapons: hunting guns Are These Comptroller?: No Who Could Verify You Are Able To Have These Secured:: reports that his wife can identify where they are  Financial Resources:  Financial resources: Media planner, [employer] (savings; wife's FMLA income) Does patient  have a Lawyer or guardian?: No  Alcohol/Substance Abuse:   What has been your use of drugs/alcohol within the last 12 months?: Pt denies If attempted suicide, did drugs/alcohol play a role in this?: No Alcohol/Substance Abuse Treatment Hx: Denies past history Has alcohol/substance abuse ever caused legal problems?: No  Social Support System:   Conservation officer, nature Support System: Fair Museum/gallery exhibitions officer System: wife and two sons are supportive Type of faith/religion: Ephriam Knuckles How does patient's faith help to cope with current illness?: "it doesAgricultural consultant:   Leisure and Hobbies: working on cars and motorcycles  Strengths/Needs:   What things does the patient do well?: talking to people, leading people In what areas does patient struggle / problems for patient: stress  Discharge Plan:   Does patient have access to transportation?: Yes Will patient be returning to same living situation after discharge?: Yes Currently receiving community mental health services: Yes (From Whom) (PCP for meds- Dr. Donalda Ewings at [facility]; Philis Nettle in [place] for therapy) If no, would patient like referral for services when discharged?: No Does patient have financial barriers related to discharge medications?: No  Summary/Recommendations:     Patient is a 56 year old male with a diagnosis of Major Depressive Disorder. Pt presented to the hospital with increased depression and stress. Pt reports primary trigger(s) for admission include possible job loss, house going into foreclosure, ongoing medical issues, and conflict with foster daughters. Patient will benefit from crisis stabilization, medication evaluation, group therapy and psycho education in addition to case management for discharge planning. At discharge it is recommended that Pt remain compliant with established discharge plan and continued treatment.   Verdene Lennert. 02/21/2017

## 2017-02-21 NOTE — Plan of Care (Signed)
Problem: Coping: Goal: Ability to verbalize frustrations and anger appropriately will improve Outcome: Not Progressing Patient was observed sleeping on shift rounds at 1930.  He was awakened and given information about evening activities.  He remained in his bed in a dark room.  Snack and fluid was taken to him.  He had medication in his room.  He talked about his epigastric pain history and depression.    Problem: Safety: Goal: Periods of time without injury will increase Outcome: Progressing Patient is compliant with safety measures and seeks assistance as needed.  He denies SI/HI/AV/H and contracts for safety.

## 2017-02-21 NOTE — Plan of Care (Signed)
Problem: Activity: Goal: Interest or engagement in activities will improve Outcome: Progressing Isolates to room.  Up for meals and medication pass only.l   Problem: Coping: Goal: Ability to verbalize frustrations and anger appropriately will improve Outcome: Progressing Pleasant and calm.

## 2017-02-22 DIAGNOSIS — F332 Major depressive disorder, recurrent severe without psychotic features: Principal | ICD-10-CM

## 2017-02-22 MED ORDER — OLANZAPINE 5 MG PO TABS
5.0000 mg | ORAL_TABLET | Freq: Every day | ORAL | Status: DC
  Administered 2017-02-22 – 2017-02-23 (×2): 5 mg via ORAL
  Filled 2017-02-22 (×2): qty 1

## 2017-02-22 NOTE — B[facility] Group Notes (Signed)
[facility] Group Notes:  (Nursing/MHT/Case Management/Adjunct)  Date:  02/22/2017  Time:  3:45 PM  Type of Therapy:  Psychoeducational Skills  Participation Level:  Active  Participation Quality:  Appropriate  Affect:  Appropriate  Cognitive:  Appropriate  Insight:  Appropriate, Good and Improving  Engagement in Group:  Engaged and Improving  Modes of Intervention:  Discussion, Education and Exploration  Summary of Progress/Problems:  Jeffrey Velazquez 02/22/2017, 3:45 PM

## 2017-02-22 NOTE — [facility] Group Notes (Signed)
LCSW Group Therapy Note   02/22/2017 9:30am   Type of Therapy and Topic:  Group Therapy:  Overcoming Obstacles   Participation Level:  Minimal   Description of Group:    In this group patients will be encouraged to explore what they see as obstacles to their own wellness and recovery. They will be guided to discuss their thoughts, feelings, and behaviors related to these obstacles. The group will process together ways to cope with barriers, with attention given to specific choices patients can make. Each patient will be challenged to identify changes they are motivated to make in order to overcome their obstacles. This group will be process-oriented, with patients participating in exploration of their own experiences as well as giving and receiving support and challenge from other group members.   Therapeutic Goals: 1. Patient will identify personal and current obstacles as they relate to admission. 2. Patient will identify barriers that currently interfere with their wellness or overcoming obstacles.  3. Patient will identify feelings, thought process and behaviors related to these barriers. 4. Patient will identify two changes they are willing to make to overcome these obstacles:      Summary of Patient Progress   Pt able to meet therapeutic goals listed above. He identifies his pain and additional stress from his job and financial situation as major obstacles contributing to his admission. He shares that he now sees he will need to have some counseling and medications to assist him through these challenges.   Therapeutic Modalities:   Cognitive Behavioral Therapy Solution Focused Therapy Motivational Interviewing Relapse Prevention Therapy  Glennon Mac, LCSW 02/22/2017 4:48 PM

## 2017-02-22 NOTE — Plan of Care (Signed)
Problem: Coping: Goal: Ability to verbalize frustrations and anger appropriately will improve Outcome: Progressing Patient complained of "being tired" this evening.  He did go to the dayroom for snack and ate while in there.  He was pleasant on contact but remains focused on physical complaints versus talking about stressors.  He accepted medications without issue.  Depression is 5/10 and epigastric pain is 1/10 this evening.    Problem: Physical Regulation: Goal: Ability to maintain clinical measurements within normal limits will improve Outcome: Progressing Patient was accompanied when walking from the room to the dayroom.  He was encouraged to hold his head up and take relaxation breaths as he tended to look down and breaths were shallow.  He was without an unsteady gait at that time.  Problem: Safety: Goal: Ability to disclose and discuss suicidal ideas will improve Outcome: Progressing Patient denies SI/HI/AV/H and contracts for safety.

## 2017-02-22 NOTE — Progress Notes (Addendum)
[facility] MD Progress Note  02/22/2017 3:33 PM Jeffrey Velazquez  MRN:  [id number]   Subjective:  History of Present Illness: Mr. Mankins is a 56 year old ma with new onset depression admitted for worsening of his symptoms in the context of severe social stressors and health problems.   02/22/2017. At the patient met with treatment team today. He complains of multiple symptoms of depression with low energy and poor concentration, feeling of hopelessness worthlessness and guilt, exacerbation of somatic symptoms with chest pain and abdominal pain, and heightened anxiety. The patient was started on new medications and slept better last night but still feels pretty down. He is withdrawn to his room and does not interact with peers or staff. He does want to get better and feels that he needs to learn better coping skills. He is cooperating with nurses, taking his medications. There are no behavioral problems.  Treatment plan. The patient has been maintained on Cymbalta in the community. Remeron was added to his regimen over the weekend. We will also start Zyprexa at bedtime for mood stabilization and depression.  Social/disposition. The patient lost his job and is in danger of losing the house. He is under considerable stress. He will return to his current living arrangements with his wife of 30 years.  Past psychiatric history. There is no history of prior hospitalizations or suicide attempts. He was started on an antidepressant 4 months ago.  Family psychiatric history. Denies family.    Legal History:  The patient does report a history of being arrested but assault charges were dismissed   Principal Problem: Major Depression  Diagnosis:   Patient Active Problem List   Diagnosis Date Noted  . Diabetes ([place]) [E11.9] 02/21/2017  . HTN (hypertension) [I10] 02/21/2017  . GERD (gastroesophageal reflux disease) [K21.9] 02/21/2017  . Vitamin B12 deficiency [E53.8] 02/21/2017  . Severe episode of  recurrent major depressive disorder, without psychotic features ([place]) [F33.2] 02/20/2017  . 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 Chest pain [R07.9] 10/29/2016  . Sinus tachycardia [R00.0] 10/29/2016  . Hyponatremia [E87.1] 10/29/2016  . Hypokalemia [E87.6] 10/29/2016   Total Time spent with patient: 20 minutes   Past Medical History:  Past Medical History:  Diagnosis Date  . Anemia   . Anxiety   . Depression   . Diabetes mellitus without complication ([place])   . DOE (dyspnea on exertion)   . Early satiety   . Epigastric abdominal pain   . GERD (gastroesophageal reflux disease)   . Hyperlipidemia   . Irritable bowel syndrome   . Shingles   . Tachycardia   . Unintended weight loss     Past Surgical History:  Procedure Laterality Date  . COLONOSCOPY WITH PROPOFOL N/A 11/19/2016   Procedure: COLONOSCOPY WITH PROPOFOL;  Surgeon: Manya Silvas, MD;  Location: [facility] ENDOSCOPY;  Service: Endoscopy;  Laterality: N/A;  . ESOPHAGOGASTRODUODENOSCOPY (EGD) WITH PROPOFOL N/A 11/06/2016   Procedure: ESOPHAGOGASTRODUODENOSCOPY (EGD) WITH PROPOFOL;  Surgeon: Manya Silvas, MD;  Location: [facility] ENDOSCOPY;  Service: Endoscopy;  Laterality: N/A;  . FRACTURE SURGERY Left 2010   Clavicle  . TONSILLECTOMY    . TOOTH EXTRACTION     wisdom teeth x 4   Family History:  Family History  Problem Relation Age of Onset  . Diabetes Mother   . Heart disease Mother   . Cancer Mother   . CAD Mother   . Heart disease Father   . CAD Father   . Diabetes Sister   .  Emphysema Maternal Grandfather   . Diabetes Paternal Grandmother   . Cancer Paternal Grandfather     Social History:  History  Alcohol Use No     History  Drug Use No    Social History   Social History  . Marital status: Married    Spouse name: N/A  . Number of children: N/A  . Years of education: N/A   Social History Main Topics  . Smoking status: Never Smoker  . Smokeless tobacco: Never Used  . Alcohol use No   . Drug use: No  . Sexual activity: Yes   Other Topics Concern  . None   Social History Narrative  . None   Additional Social History:    Pain Medications: See PTA Prescriptions: See PTA Over the Counter: See PTA History of alcohol / drug use?: No history of alcohol / drug abuse    Sleep: Fair  Appetite:  Fair  Current Medications: Current Facility-Administered Medications  Medication Dose Route Frequency Provider Last Rate Last Dose  . acetaminophen (TYLENOL) tablet 650 mg  650 mg Oral Q6H PRN Chauncey Mann, MD      . alum & mag hydroxide-simeth (MAALOX/MYLANTA) 200-200-20 MG/5ML suspension 30 mL  30 mL Oral Q4H PRN Chauncey Mann, MD      . aspirin EC tablet 81 mg  81 mg Oral Daily Chauncey Mann, MD   81 mg at 02/22/17 0900  . atorvastatin (LIPITOR) tablet 20 mg  20 mg Oral Daily Chauncey Mann, MD   20 mg at 02/22/17 0900  . carbamazepine (TEGRETOL) tablet 200 mg  200 mg Oral BID Chauncey Mann, MD   200 mg at 02/22/17 0900  . cholecalciferol (VITAMIN D) tablet 5,000 Units  5,000 Units Oral Daily Chauncey Mann, MD   5,000 Units at 02/22/17 0900  . DULoxetine (CYMBALTA) DR capsule 60 mg  60 mg Oral Daily Chauncey Mann, MD   60 mg at 02/22/17 0900  . feeding supplement (GLUCERNA SHAKE) (GLUCERNA SHAKE) liquid 237 mL  237 mL Oral BID BM Chauncey Mann, MD   237 mL at 02/22/17 1000  . folic acid (FOLVITE) tablet 0.5 mg  0.5 mg Oral Daily Chauncey Mann, MD   0.5 mg at 02/22/17 0900  . gabapentin (NEURONTIN) capsule 600 mg  600 mg Oral TID Chauncey Mann, MD   600 mg at 02/22/17 1203  . glimepiride (AMARYL) tablet 2 mg  2 mg Oral BID Chauncey Mann, MD   2 mg at 02/22/17 0900  . lidocaine (LIDODERM) 5 % 1 patch  1 patch Transdermal Q24H Chauncey Mann, MD   1 patch at 02/22/17 0900  . magnesium hydroxide (MILK OF MAGNESIA) suspension 30 mL  30 mL Oral Daily PRN Chauncey Mann, MD      . metFORMIN (GLUCOPHAGE-XR) 24 hr tablet 500 mg  500 mg Oral BID Chauncey Mann, MD   500 mg  at 02/22/17 0900  . metoprolol tartrate (LOPRESSOR) tablet 12.5 mg  12.5 mg Oral BID Chauncey Mann, MD   12.5 mg at 02/22/17 0900  . mirtazapine (REMERON) tablet 15 mg  15 mg Oral QHS Chauncey Mann, MD   15 mg at 02/21/17 2126  . multivitamin with minerals tablet 1 tablet  1 tablet Oral Daily Chauncey Mann, MD   1 tablet at 02/22/17 0900  . omega-3 acid ethyl esters (LOVAZA) capsule 2 g  2 g Oral Daily Cephus Shelling  K, MD   2 g at 02/22/17 0900  . pantoprazole (PROTONIX) EC tablet 40 mg  40 mg Oral Daily Chauncey Mann, MD   40 mg at 02/22/17 0900  . protein supplement (PREMIER PROTEIN) liquid  11 oz Oral Q24H Chauncey Mann, MD   11 oz at 02/21/17 2131  . sucralfate (CARAFATE) tablet 1 g  1 g Oral TID WC & HS Chauncey Mann, MD   1 g at 02/22/17 1203  . vitamin B-12 (CYANOCOBALAMIN) tablet 100 mcg  100 mcg Oral Daily Chauncey Mann, MD   100 mcg at 02/22/17 0900    Lab Results:  Results for orders placed or performed during the hospital encounter of 02/20/17 (from the past 48 hour(s))  Vitamin B12     Status: None   Collection Time: 02/21/17  7:27 AM  Result Value Ref Range   Vitamin B-12 242 180 - 914 pg/mL    Comment: (NOTE) This assay is not validated for testing neonatal or myeloproliferative syndrome specimens for Vitamin B12 levels. Performed at [facility], [place] [address], [place], [place] [address]   Folate     Status: None   Collection Time: 02/21/17  7:27 AM  Result Value Ref Range   Folate 13.4 >5.9 ng/mL    Blood Alcohol level:  No results found for: [facility]  Metabolic Disorder Labs: Lab Results  Component Value Date   HGBA1C 6.5 (H) 02/20/2017   MPG 139.85 02/20/2017   MPG 148 10/29/2016   No results found for: PROLACTIN Lab Results  Component Value Date   CHOL 158 02/20/2017   TRIG 215 (H) 02/20/2017   HDL 45 02/20/2017   CHOLHDL 3.5 02/20/2017   VLDL 43 (H) 02/20/2017   LDLCALC 70 02/20/2017     CIWA:  CIWA-Ar Total: 2 COWS:  COWS Total Score:  2  Musculoskeletal: Strength & Muscle Tone: within normal limits Gait & Station: normal Patient leans: N/A  Psychiatric Specialty Exam: Physical Exam  Nursing note and vitals reviewed. Psychiatric: His speech is normal. Judgment and thought content normal. His affect is blunt. He is slowed. Cognition and memory are normal. He exhibits a depressed mood.    Review of Systems  Musculoskeletal: Positive for myalgias.  Neurological: Negative.   Psychiatric/Behavioral: Positive for depression and suicidal ideas. The patient has insomnia.   All other systems reviewed and are negative.   Blood pressure 121/76, pulse (!) 107, temperature 97.8 F (36.6 C), temperature source Oral, resp. rate 20, height _0  (1.727 m), weight 62.6 kg (138 lb), SpO2 100 %.Body mass index is 20.98 kg/m.  General Appearance: Fairly Groomed  Eye Contact:  Good  Speech:  Clear and Coherent  Volume:  Decreased  Mood:  Anxious and Depressed  Affect:  Blunt and Depressed  Thought Process:  Goal Directed and Descriptions of Associations: Intact  Orientation:  Full (Time, Place, and Person)  Thought Content:  WDL  Suicidal Thoughts:  No  Homicidal Thoughts:  No  Memory:  Immediate;   Fair Recent;   Fair Remote;   Fair  Judgement:  Fair  Insight:  Good  Psychomotor Activity:  Psychomotor Retardation  Concentration:  Concentration: Fair and Attention Span: Fair  Recall:  [employer] of Knowledge:  Fair  Language:  Fair  Akathisia:  No  Handed:  Right  AIMS (if indicated):     Assets:  Communication Skills Desire for Improvement Physical Health Resilience Social Support Transportation Vocational/Educational  ADL's:  Intact  Cognition:  WNL  Sleep:  Number of Hours: 7.45     Treatment Plan Summary: Mr. Auld is a 56 year old male with new onset depression admitted for worsening of his symptoms in the face of severe psychosocial stressors.   1. Mood. We continue Cymbalta 60 mg daily with  addition of Remeron 15 mg nightly for depression. We will also start Zyprexa 5 mg nightly stabilization.    2. Neuralgia. The patient has been maintained on Tegretol 200 mg twice daily.   3. Diabetes. The patient is on metformin and Glimepiride, and ADA diet.  4. Hypertension. He is on metoprolol  5. Dyslipidemia. Continue Lipitor.  6. GERD. He is on omeprazole.   7. Weight loss. We offered Glucerna.   8. Chronic pain. He is on tramadol as needed.   9. Hypertension. We continue metoprolol.  10. Vitamin B12 deficiency. We will continue supplementation.  11. Metabolic syndrome monitoring. Lipid panel, TSH, hemoglobin A1c are pending.  12. EKG. Pending.  13. Disposition. The patient will be discharge with his wife. He will follow up with [facility].    Daily contact with patient to assess and evaluate symptoms and progress in treatment and Medication management  Orson Slick, MD 02/22/2017, 3:33 PM

## 2017-02-22 NOTE — Tx Team (Signed)
Interdisciplinary Treatment and Diagnostic Plan Update  02/22/2017 Time of Session: 10:30am Jeffrey Velazquez MRN: [id number]  Principal Diagnosis: Severe episode of recurrent major depressive disorder, without psychotic features (HCC)  Secondary Diagnoses: Principal Problem:   Severe episode of recurrent major depressive disorder, without psychotic features (HCC) Active Problems:   Insomnia due to mental disorder   Diabetes (HCC)   HTN (hypertension)   GERD (gastroesophageal reflux disease)   Vitamin B12 deficiency   Current Medications:  Current Facility-Administered Medications  Medication Dose Route Frequency Provider Last Rate Last Dose  . acetaminophen (TYLENOL) tablet 650 mg  650 mg Oral Q6H PRN Darliss Ridgel, MD      . alum & mag hydroxide-simeth (MAALOX/MYLANTA) 200-200-20 MG/5ML suspension 30 mL  30 mL Oral Q4H PRN Darliss Ridgel, MD      . aspirin EC tablet 81 mg  81 mg Oral Daily Darliss Ridgel, MD   81 mg at 02/22/17 0900  . atorvastatin (LIPITOR) tablet 20 mg  20 mg Oral Daily Darliss Ridgel, MD   20 mg at 02/22/17 0900  . carbamazepine (TEGRETOL) tablet 200 mg  200 mg Oral BID Darliss Ridgel, MD   200 mg at 02/22/17 0900  . cholecalciferol (VITAMIN D) tablet 5,000 Units  5,000 Units Oral Daily Darliss Ridgel, MD   5,000 Units at 02/22/17 0900  . DULoxetine (CYMBALTA) DR capsule 60 mg  60 mg Oral Daily Darliss Ridgel, MD   60 mg at 02/22/17 0900  . feeding supplement (GLUCERNA SHAKE) (GLUCERNA SHAKE) liquid 237 mL  237 mL Oral BID BM Darliss Ridgel, MD   237 mL at 02/22/17 1000  . folic acid (FOLVITE) tablet 0.5 mg  0.5 mg Oral Daily Darliss Ridgel, MD   0.5 mg at 02/22/17 0900  . gabapentin (NEURONTIN) capsule 600 mg  600 mg Oral TID Darliss Ridgel, MD   600 mg at 02/22/17 1203  . glimepiride (AMARYL) tablet 2 mg  2 mg Oral BID Darliss Ridgel, MD   2 mg at 02/22/17 0900  . lidocaine (LIDODERM) 5 % 1 patch  1 patch Transdermal Q24H Darliss Ridgel, MD   1 patch at  02/22/17 0900  . magnesium hydroxide (MILK OF MAGNESIA) suspension 30 mL  30 mL Oral Daily PRN Darliss Ridgel, MD      . metFORMIN (GLUCOPHAGE-XR) 24 hr tablet 500 mg  500 mg Oral BID Darliss Ridgel, MD   500 mg at 02/22/17 0900  . metoprolol tartrate (LOPRESSOR) tablet 12.5 mg  12.5 mg Oral BID Darliss Ridgel, MD   12.5 mg at 02/22/17 0900  . mirtazapine (REMERON) tablet 15 mg  15 mg Oral QHS Darliss Ridgel, MD   15 mg at 02/21/17 2126  . multivitamin with minerals tablet 1 tablet  1 tablet Oral Daily Darliss Ridgel, MD   1 tablet at 02/22/17 0900  . OLANZapine (ZYPREXA) tablet 5 mg  5 mg Oral QHS Pucilowska, Jolanta B, MD      . omega-3 acid ethyl esters (LOVAZA) capsule 2 g  2 g Oral Daily Darliss Ridgel, MD   2 g at 02/22/17 0900  . pantoprazole (PROTONIX) EC tablet 40 mg  40 mg Oral Daily Darliss Ridgel, MD   40 mg at 02/22/17 0900  . protein supplement (PREMIER PROTEIN) liquid  11 oz Oral Q24H Darliss Ridgel, MD   11 oz at 02/21/17 2131  . sucralfate (CARAFATE) tablet  1 g  1 g Oral TID WC & HS Darliss Ridgel, MD   1 g at 02/22/17 1203  . vitamin B-12 (CYANOCOBALAMIN) tablet 100 mcg  100 mcg Oral Daily Darliss Ridgel, MD   100 mcg at 02/22/17 0900   PTA Medications: Prescriptions Prior to Admission  Medication Sig Dispense Refill Last Dose  . aspirin EC 81 MG tablet Take 81 mg by mouth daily.   Past Week at Unknown time  . atorvastatin (LIPITOR) 20 MG tablet Take 20 mg by mouth daily.   11/18/2016 at Unknown time  . carbamazepine (TEGRETOL) 200 MG tablet Take 200 mg by mouth 2 (two) times daily.  3 11/18/2016 at Unknown time  . cholecalciferol (VITAMIN D) 1000 units tablet Take 5,000 Units by mouth daily.    11/18/2016 at Unknown time  . feeding supplement, GLUCERNA SHAKE, (GLUCERNA SHAKE) LIQD Take 237 mLs by mouth 2 (two) times daily between meals. 60 Can 0   . folic acid (FOLVITE) 400 MCG tablet Take 1 tablet by mouth daily.   11/18/2016 at Unknown time  . gabapentin (NEURONTIN) 300 MG capsule  Take 600-900 mg by mouth 3 (three) times daily. Take 600 mg every morning and lunch and 900 mg at bedtime.   11/18/2016 at Unknown time  . glimepiride (AMARYL) 2 MG tablet Take 2 mg by mouth 2 (two) times daily.   11/18/2016 at Unknown time  . Lancets 30G MISC by Does not apply route.   11/18/2016 at Unknown time  . lidocaine (LIDODERM) 5 % Place 1 patch onto the skin daily. Remove & Discard patch within 12 hours or as directed by MD   11/18/2016 at Unknown time  . Melatonin 5 MG TABS Take 10 mg by mouth at bedtime.    11/18/2016 at Unknown time  . metFORMIN (GLUCOPHAGE-XR) 500 MG 24 hr tablet Take 500 mg by mouth 2 (two) times daily.   11/18/2016 at Unknown time  . metoprolol tartrate (LOPRESSOR) 25 MG tablet Take 0.5 tablets (12.5 mg total) by mouth 2 (two) times daily. 60 tablet 0 11/18/2016 at Unknown time  . Multiple Vitamin (MULTI-VITAMINS) TABS Take 1 tablet by mouth daily.   11/18/2016 at Unknown time  . nortriptyline (PAMELOR) 10 MG capsule Take 10 mg by mouth daily.   1 11/18/2016 at Unknown time  . Omega-3 Fatty Acids (FISH OIL PO) Take 1 capsule by mouth 2 (two) times daily.   11/18/2016 at Unknown time  . omeprazole (PRILOSEC) 20 MG capsule Take 2 capsules (40 mg total) by mouth daily. 60 capsule 1 11/18/2016 at Unknown time  . sucralfate (CARAFATE) 1 g tablet Take 1 g by mouth 4 (four) times daily -  with meals and at bedtime.   11/18/2016 at Unknown time  . traMADol (ULTRAM) 50 MG tablet Take 1 tablet (50 mg total) by mouth every 6 (six) hours as needed. (Patient taking differently: Take 50 mg by mouth every 6 (six) hours as needed. ) 15 tablet 0 Completed Course at Unknown time  . VIAGRA 50 MG tablet Take 50 mg by mouth daily as needed for erectile dysfunction.  3 Past Month at Unknown time  . vitamin B-12 (CYANOCOBALAMIN) 1000 MCG tablet Take 100 mcg by mouth daily.   Past Week at Unknown time    Patient Stressors: Financial difficulties  Patient Strengths: Capable of independent living  Treatment  Modalities: Medication Management, Group therapy, Case management,  1 to 1 session with clinician, Psychoeducation, Recreational therapy.   Physician  Treatment Plan for Primary Diagnosis: Severe episode of recurrent major depressive disorder, without psychotic features (HCC) Long Term Goal(s): Improvement in symptoms so as ready for discharge Improvement in symptoms so as ready for discharge   Short Term Goals: Ability to verbalize feelings will improve Ability to identify and develop effective coping behaviors will improve Ability to maintain clinical measurements within normal limits will improve Ability to verbalize feelings will improve Ability to identify and develop effective coping behaviors will improve  Medication Management: Evaluate patient's response, side effects, and tolerance of medication regimen.  Therapeutic Interventions: 1 to 1 sessions, Unit Group sessions and Medication administration.  Evaluation of Outcomes: Progressing  Physician Treatment Plan for Secondary Diagnosis: Principal Problem:   Severe episode of recurrent major depressive disorder, without psychotic features (HCC) Active Problems:   Insomnia due to mental disorder   Diabetes (HCC)   HTN (hypertension)   GERD (gastroesophageal reflux disease)   Vitamin B12 deficiency  Long Term Goal(s): Improvement in symptoms so as ready for discharge Improvement in symptoms so as ready for discharge   Short Term Goals: Ability to verbalize feelings will improve Ability to identify and develop effective coping behaviors will improve Ability to maintain clinical measurements within normal limits will improve Ability to verbalize feelings will improve Ability to identify and develop effective coping behaviors will improve     Medication Management: Evaluate patient's response, side effects, and tolerance of medication regimen.  Therapeutic Interventions: 1 to 1 sessions, Unit Group sessions and Medication  administration.  Evaluation of Outcomes: Progressing   RN Treatment Plan for Primary Diagnosis: Severe episode of recurrent major depressive disorder, without psychotic features (HCC) Long Term Goal(s): Knowledge of disease and therapeutic regimen to maintain health will improve  Short Term Goals: Ability to verbalize frustration and anger appropriately will improve, Ability to identify and develop effective coping behaviors will improve and Compliance with prescribed medications will improve  Medication Management: RN will administer medications as ordered by provider, will assess and evaluate patient's response and provide education to patient for prescribed medication. RN will report any adverse and/or side effects to prescribing provider.  Therapeutic Interventions: 1 on 1 counseling sessions, Psychoeducation, Medication administration, Evaluate responses to treatment, Monitor vital signs and CBGs as ordered, Perform/monitor CIWA, COWS, AIMS and Fall Risk screenings as ordered, Perform wound care treatments as ordered.  Evaluation of Outcomes: Progressing   LCSW Treatment Plan for Primary Diagnosis: Severe episode of recurrent major depressive disorder, without psychotic features (HCC) Long Term Goal(s): Safe transition to appropriate next level of care at discharge, Engage patient in therapeutic group addressing interpersonal concerns.  Short Term Goals: Engage patient in aftercare planning with referrals and resources, Increase emotional regulation, Identify triggers associated with mental health/substance abuse issues and Increase skills for wellness and recovery  Therapeutic Interventions: Assess for all discharge needs, 1 to 1 time with Social worker, Explore available resources and support systems, Assess for adequacy in community support network, Educate family and significant other(s) on suicide prevention, Complete Psychosocial Assessment, Interpersonal group therapy.  Evaluation  of Outcomes: Progressing   Progress in Treatment: Attending groups: Yes. Participating in groups: Yes. Taking medication as prescribed: Yes. Toleration medication: Yes. Family/Significant other contact made: Yes, individual(s) contacted:  wife Patient understands diagnosis: Yes. Discussing patient identified problems/goals with staff: Yes. Medical problems stabilized or resolved: Yes. Denies suicidal/homicidal ideation: Yes. Issues/concerns per patient self-inventory: No. Other:    New problem(s) identified: No, Describe:     New Short Term/Long Term Goal(s):  Discharge Plan or Barriers:   Reason for Continuation of Hospitalization: Anxiety Depression Medication stabilization  Coordination of After care planning  Estimated Length of Stay: 3-5 days  Attendees: Patient:Jeffrey Velazquez 02/22/2017 4:41 PM  Physician: Braulio Conte Pucilowska 02/22/2017 4:41 PM  Nursing: Marjo Bicker, RN 02/22/2017 4:41 PM  RN Care Manager: 02/22/2017 4:41 PM  Social Worker: Jake Shark, LCSW 02/22/2017 4:41 PM  Recreational Therapist:  02/22/2017 4:41 PM  Other:  02/22/2017 4:41 PM  Other:  02/22/2017 4:41 PM  Other: 02/22/2017 4:41 PM    Scribe for Treatment Team: Glennon Mac, LCSW 02/22/2017 4:41 PM

## 2017-02-22 NOTE — Plan of Care (Signed)
Problem: Activity: Goal: Interest or engagement in activities will improve Outcome: Progressing Patient is attending group and engaging in group discussion  Problem: Education: Goal: Mental status will improve Outcome: Progressing Patient is in a pleasant mood but seems a little flat affect this am

## 2017-02-23 NOTE — Progress Notes (Signed)
CSW spoke to pt wife, Carmela Hurt, who shared that pt continually downplays what is going on, likes to blame it on the shingles, but pt has been declining for some time--lots of stressors.  He did get his short term disability, which helps.   Garner Nash, MSW, LCSW Clinical Social Worker 02/23/2017 4:03 PM

## 2017-02-23 NOTE — Plan of Care (Signed)
Problem: Activity: Goal: Interest or engagement in activities will improve Outcome: Progressing Patient is reclusive to room most of time; however he does attend group occasionally  Problem: Education: Goal: Emotional status will improve Outcome: Not Progressing Patient continues to have a flat affect Goal: Mental status will improve Outcome: Progressing Patient denies any thoughts of self harm he continues to have a flat affect  Problem: Coping: Goal: Ability to demonstrate self-control will improve Outcome: Progressing Patient has been calm and cooperative this shift  Problem: Safety: Goal: Ability to disclose and discuss suicidal ideas will improve Outcome: Progressing Patient denies SI and does agree to alert staff if he has any thoughts or plans to harm self

## 2017-02-23 NOTE — Plan of Care (Signed)
  Problem: Safety: Goal: Ability to disclose and discuss suicidal ideas will improve Outcome: Progressing  Patient denies suicidal ideation   

## 2017-02-23 NOTE — [facility] Group Notes (Signed)
[facility] LCSW Group Therapy Note  Date/Time: 02/23/17, 0930  Type of Therapy/Topic:  Group Therapy:  Feelings about Diagnosis  Participation Level:  Minimal   Mood:pleasant   Description of Group:    This group will allow patients to explore their thoughts and feelings about diagnoses they have received. Patients will be guided to explore their level of understanding and acceptance of these diagnoses. Facilitator will encourage patients to process their thoughts and feelings about the reactions of others to their diagnosis, and will guide patients in identifying ways to discuss their diagnosis with significant others in their lives. This group will be process-oriented, with patients participating in exploration of their own experiences as well as giving and receiving support and challenge from other group members.   Therapeutic Goals: 1. Patient will demonstrate understanding of diagnosis as evidence by identifying two or more symptoms of the disorder:  2. Patient will be able to express two feelings regarding the diagnosis 3. Patient will demonstrate ability to communicate their needs through discussion and/or role plays  Summary of Patient Progress:Pt identified his diagnosis as depression but did not make many contributions to the group discussion today.        Therapeutic Modalities:   Cognitive Behavioral Therapy Brief Therapy Feelings Identification   Daleen Squibb, LCSW

## 2017-02-23 NOTE — [facility] Group Notes (Signed)
[facility] Group Notes:  (Nursing/MHT/Case Management/Adjunct)  Date:  02/23/2017  Time:  1:54 AM  Type of Therapy:  Psychoeducational Skills  Participation Level:  Active  Participation Quality:  Appropriate  Affect:  Appropriate  Cognitive:  Appropriate  Insight:  Good  Engagement in Group:  Engaged  Modes of Intervention:  Discussion, Socialization and Support  Summary of Progress/Problems:  Jeffrey Velazquez 02/23/2017, 1:54 AM

## 2017-02-23 NOTE — Progress Notes (Signed)
[facility] MD Progress Note  02/23/2017 10:30 PM Jeffrey Velazquez  MRN:  [id number]  Subjective:  Jeffrey Velazquez has a history of depression admitted for suicidal ideation.  Today the patient is still very depressed with suicidal ideation and severe psychomotor retardation. He slept most of the day today. No somatic complaints. Some group participation.  Treatment plan. We will continue Cymbalta, Remeron and Zyprexa for depression.  Past psychiatric history. Depression.  Family psychiatric history. Nonereported.  Social/disposition. The patient is on short term disability. He will return home with family.   Principal Problem: Severe episode of recurrent major depressive disorder, without psychotic features (HCC) Diagnosis:   Patient Active Problem List   Diagnosis Date Noted  . Diabetes (HCC) [E11.9] 02/21/2017  . HTN (hypertension) [I10] 02/21/2017  . GERD (gastroesophageal reflux disease) [K21.9] 02/21/2017  . Vitamin B12 deficiency [E53.8] 02/21/2017  . Severe episode of recurrent major depressive disorder, without psychotic features (HCC) [F33.2] 02/20/2017  . 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 Chest pain [R07.9] 10/29/2016  . Sinus tachycardia [R00.0] 10/29/2016  . Hyponatremia [E87.1] 10/29/2016  . Hypokalemia [E87.6] 10/29/2016   Total Time spent with patient: 30 minutes  Past Medical History:  Past Medical History:  Diagnosis Date  . Anemia   . Anxiety   . Depression   . Diabetes mellitus without complication (HCC)   . DOE (dyspnea on exertion)   . Early satiety   . Epigastric abdominal pain   . GERD (gastroesophageal reflux disease)   . Hyperlipidemia   . Irritable bowel syndrome   . Shingles   . Tachycardia   . Unintended weight loss     Past Surgical History:  Procedure Laterality Date  . COLONOSCOPY WITH PROPOFOL N/A 11/19/2016   Procedure: COLONOSCOPY WITH PROPOFOL;  Surgeon: Scot Jun, MD;  Location: [facility] ENDOSCOPY;  Service: Endoscopy;   Laterality: N/A;  . ESOPHAGOGASTRODUODENOSCOPY (EGD) WITH PROPOFOL N/A 11/06/2016   Procedure: ESOPHAGOGASTRODUODENOSCOPY (EGD) WITH PROPOFOL;  Surgeon: Scot Jun, MD;  Location: [facility] ENDOSCOPY;  Service: Endoscopy;  Laterality: N/A;  . FRACTURE SURGERY Left 2010   Clavicle  . TONSILLECTOMY    . TOOTH EXTRACTION     wisdom teeth x 4   Family History:  Family History  Problem Relation Age of Onset  . Diabetes Mother   . Heart disease Mother   . Cancer Mother   . CAD Mother   . Heart disease Father   . CAD Father   . Diabetes Sister   . Emphysema Maternal Grandfather   . Diabetes Paternal Grandmother   . Cancer Paternal Grandfather     Social History:  History  Alcohol Use No     History  Drug Use No    Social History   Social History  . Marital status: Married    Spouse name: N/A  . Number of children: N/A  . Years of education: N/A   Social History Main Topics  . Smoking status: Never Smoker  . Smokeless tobacco: Never Used  . Alcohol use No  . Drug use: No  . Sexual activity: Yes   Other Topics Concern  . None   Social History Narrative  . None   Additional Social History:    Pain Medications: See PTA Prescriptions: See PTA Over the Counter: See PTA History of alcohol / drug use?: No history of alcohol / drug abuse  Sleep: Fair  Appetite:  Fair  Current Medications: Current Facility-Administered Medications  Medication Dose Route Frequency Provider Last Rate Last Dose  . acetaminophen (TYLENOL) tablet 650 mg  650 mg Oral Q6H PRN Darliss Ridgel, MD      . alum & mag hydroxide-simeth (MAALOX/MYLANTA) 200-200-20 MG/5ML suspension 30 mL  30 mL Oral Q4H PRN Darliss Ridgel, MD      . aspirin EC tablet 81 mg  81 mg Oral Daily Darliss Ridgel, MD   81 mg at 02/23/17 0839  . atorvastatin (LIPITOR) tablet 20 mg  20 mg Oral Daily Darliss Ridgel, MD   20 mg at 02/23/17 0840  . carbamazepine (TEGRETOL) tablet 200 mg  200 mg  Oral BID Darliss Ridgel, MD   200 mg at 02/23/17 1711  . cholecalciferol (VITAMIN D) tablet 5,000 Units  5,000 Units Oral Daily Darliss Ridgel, MD   5,000 Units at 02/23/17 0840  . DULoxetine (CYMBALTA) DR capsule 60 mg  60 mg Oral Daily Darliss Ridgel, MD   60 mg at 02/23/17 0839  . feeding supplement (GLUCERNA SHAKE) (GLUCERNA SHAKE) liquid 237 mL  237 mL Oral BID BM Darliss Ridgel, MD   237 mL at 02/23/17 1400  . folic acid (FOLVITE) tablet 0.5 mg  0.5 mg Oral Daily Darliss Ridgel, MD   0.5 mg at 02/23/17 0839  . gabapentin (NEURONTIN) capsule 600 mg  600 mg Oral TID Darliss Ridgel, MD   600 mg at 02/23/17 1710  . glimepiride (AMARYL) tablet 2 mg  2 mg Oral BID Darliss Ridgel, MD   2 mg at 02/23/17 1711  . lidocaine (LIDODERM) 5 % 1 patch  1 patch Transdermal Q24H Darliss Ridgel, MD   1 patch at 02/23/17 0840  . magnesium hydroxide (MILK OF MAGNESIA) suspension 30 mL  30 mL Oral Daily PRN Darliss Ridgel, MD      . metFORMIN (GLUCOPHAGE-XR) 24 hr tablet 500 mg  500 mg Oral BID Darliss Ridgel, MD   500 mg at 02/23/17 1711  . metoprolol tartrate (LOPRESSOR) tablet 12.5 mg  12.5 mg Oral BID Darliss Ridgel, MD   12.5 mg at 02/23/17 1710  . mirtazapine (REMERON) tablet 15 mg  15 mg Oral QHS Darliss Ridgel, MD   15 mg at 02/23/17 2140  . multivitamin with minerals tablet 1 tablet  1 tablet Oral Daily Darliss Ridgel, MD   1 tablet at 02/23/17 0839  . OLANZapine (ZYPREXA) tablet 5 mg  5 mg Oral QHS Nazim Kadlec B, MD   5 mg at 02/23/17 2140  . omega-3 acid ethyl esters (LOVAZA) capsule 2 g  2 g Oral Daily Darliss Ridgel, MD   2 g at 02/23/17 0842  . pantoprazole (PROTONIX) EC tablet 40 mg  40 mg Oral Daily Darliss Ridgel, MD   40 mg at 02/23/17 0840  . protein supplement (PREMIER PROTEIN) liquid  11 oz Oral Q24H Darliss Ridgel, MD   11 oz at 02/23/17 2100  . sucralfate (CARAFATE) tablet 1 g  1 g Oral TID WC & HS Darliss Ridgel, MD   1 g at 02/23/17 2140  . vitamin B-12 (CYANOCOBALAMIN) tablet 100  mcg  100 mcg Oral Daily Darliss Ridgel, MD   100 mcg at 02/23/17 4098    Lab Results: No results found for this or any previous visit (from the past 48 hour(s)).  Blood Alcohol level:  No results found for: [facility]  Metabolic Disorder Labs: Lab Results  Component Value Date   HGBA1C 6.5 (H) 02/20/2017   MPG 139.85 02/20/2017   MPG 148 10/29/2016   No results found for: PROLACTIN Lab Results  Component Value Date   CHOL 158 02/20/2017   TRIG 215 (H) 02/20/2017   HDL 45 02/20/2017   CHOLHDL 3.5 02/20/2017   VLDL 43 (H) 02/20/2017   LDLCALC 70 02/20/2017    Physical Findings: AIMS:  , ,  ,  ,    CIWA:  CIWA-Ar Total: 2 COWS:  COWS Total Score: 2  Musculoskeletal: Strength & Muscle Tone: within normal limits Gait & Station: normal Patient leans: N/A  Psychiatric Specialty Exam: Physical Exam  Nursing note and vitals reviewed. Psychiatric: His speech is normal. Judgment normal. His affect is blunt. He is slowed and withdrawn. Cognition and memory are normal. He exhibits a depressed mood. He expresses suicidal ideation. He expresses suicidal plans.    Review of Systems  Psychiatric/Behavioral: Positive for depression and suicidal ideas.  All other systems reviewed and are negative.   Blood pressure 129/70, pulse 99, temperature 98.4 F (36.9 C), temperature source Oral, resp. rate 18, height  (1.727 m), weight 62.6 kg (138 lb), SpO2 100 %.Body mass index is 20.98 kg/m.  General Appearance: Casual  Eye Contact:  Good  Speech:  Slow  Volume:  Decreased  Mood:  Depressed  Affect:  Blunt  Thought Process:  Goal Directed and Descriptions of Associations: Intact  Orientation:  Full (Time, Place, and Person)  Thought Content:  WDL  Suicidal Thoughts:  Yes.  with intent/plan  Homicidal Thoughts:  No  Memory:  Immediate;   Fair Recent;   Fair Remote;   Fair  Judgement:  Poor  Insight:  Shallow  Psychomotor Activity:  Psychomotor Retardation  Concentration:   Concentration: Fair and Attention Span: Fair  Recall:  [employer] of Knowledge:  Fair  Language:  Fair  Akathisia:  No  Handed:  Right  AIMS (if indicated):     Assets:  Communication Skills Desire for Improvement Financial Resources/Insurance Housing Physical Health Resilience Social Support  ADL's:  Intact  Cognition:  WNL  Sleep:  Number of Hours: 7     Treatment Plan Summary: Daily contact with patient to assess and evaluate symptoms and progress in treatment and Medication management   Jeffrey Velazquez is a 56 year old male with new onset depression admitted for worsening of his symptoms in the face of severe psychosocial stressors.   1. Mood. We continue Cymbalta 60 mg daily with addition of Remeron 15 mg nightly for depression. We will also start Zyprexa 5 mg nightly stabilization.    2. Neuralgia. The patient has been maintained on Tegretol 200 mg twice daily.   3. Diabetes. The patient is on metformin and Glimepiride, and ADA diet.  4. Hypertension. He is on metoprolol  5. Dyslipidemia. Continue Lipitor.  6. GERD. He is on omeprazole.   7. Weight loss. We offered Glucerna.   8. Chronic pain. He is on tramadol as needed.   9. Hypertension. We continue metoprolol.  10. Vitamin B12 deficiency. We will continue supplementation.  11. Metabolic syndrome monitoring. Lipid panel, TSH, hemoglobin A1c are pending.  12. EKG. Pending.  13. Disposition. The patient will be discharge with his wife. He will follow up with [facility].    Kristine Linea, MD 02/23/2017, 10:30 PM

## 2017-02-23 NOTE — Progress Notes (Signed)
D: Pt denies SI/HI/AVH, affect is flat and sad but brightens upon approach. Patient's  thoughts are organized, and coherent, his mood is pleasant and cooperative with treatment care. Pt appears less anxious, although he isolates to his room, he rated his depression a 6/10 on a scale of (0 -10).  A: Pt was offered support and encouragement. Pt was given scheduled medications. Pt was encouraged to attend groups. Q 15 minute checks were done for safety.  R: Ptt did not attend evening wrap up group. Patient is complaint with  Medication, and  receptive to treatment. Safety maintained on unit, will continue to monitor.

## 2017-02-24 ENCOUNTER — Other Ambulatory Visit: Payer: Self-pay

## 2017-02-24 MED ORDER — DULOXETINE HCL 30 MG PO CPEP
60.0000 mg | ORAL_CAPSULE | Freq: Two times a day (BID) | ORAL | Status: DC
  Administered 2017-02-25 – 2017-02-26 (×3): 60 mg via ORAL
  Filled 2017-02-24 (×3): qty 2

## 2017-02-24 MED ORDER — CYANOCOBALAMIN 1000 MCG/ML IJ SOLN
1000.0000 ug | Freq: Every day | INTRAMUSCULAR | Status: AC
  Administered 2017-02-24 – 2017-02-26 (×3): 1000 ug via INTRAMUSCULAR
  Filled 2017-02-24 (×3): qty 1

## 2017-02-24 MED ORDER — OLANZAPINE 5 MG PO TABS
15.0000 mg | ORAL_TABLET | Freq: Every day | ORAL | Status: DC
  Administered 2017-02-24 – 2017-02-25 (×2): 15 mg via ORAL
  Filled 2017-02-24 (×2): qty 1

## 2017-02-24 NOTE — Progress Notes (Signed)
D: Pt denies SI/HI/AVH, affect is flat and sad but brightens upon approach. Patient's  thoughts are organized and logically his mood is pleasant and cooperative with treatment care. Pt appears less anxious, still isolated this evening to his room  isolates to his room. Patient rated his depression a 5/10 on a scale of (0 -10), and he stated " l feel a little better"  A: Pt was offered support and encouragement. Pt was given scheduled medications. Pt was encouraged to attend groups. Q 15 minute checks were done for safety.  R: Ptt did not attend evening wrap up group. Patient is complaint with medication, and  receptive to treatment. Safety maintained on unit, will continue to monitor.

## 2017-02-24 NOTE — Plan of Care (Signed)
Problem: Coping: Goal: Ability to verbalize frustrations and anger appropriately will improve Outcome: Progressing Patent verbalized frustrations to staff.

## 2017-02-24 NOTE — [facility] Group Notes (Signed)
[facility] Group Notes:  (Nursing/MHT/Case Management/Adjunct)  Date:  02/24/2017  Time:  7:14 PM  Type of Therapy:  Psychoeducational Skills  Participation Level:  Active  Participation Quality:  Appropriate, Attentive and Sharing  Affect:  Appropriate  Cognitive:  Alert and Appropriate  Insight:  Appropriate, Good and Improving  Engagement in Group:  Engaged and Improving  Modes of Intervention:  Discussion  Summary of Progress/Problems:  Jeffrey Velazquez 02/24/2017, 7:14 PM

## 2017-02-24 NOTE — Progress Notes (Signed)
[facility] MD Progress Note  02/24/2017 5:28 PM Jeffrey Velazquez  MRN:  [id number]  Subjective:  Jeffrey Velazquez is a 56 year old male with history of depression admitted for suicidal thinking in the context of worsening of general health and multiple social problems.  Today the patient shows no improvement. He feels depressed and suicidal. There is severe psychomotor retardation. He is preoccupied with his somatic problems that he believes stem from shingles and he suffered 3 months ago. He tries to participate in programming but his concentration and energy are pretty low. His wife points out to the fact that the patient has been in fetal position at home for hours. He lost 60 pounds prior to admission. No physical cause of his weight loss was ever found in spite of thorough investigation.  Treatment plan. We will double Cymbalta to 60 mg twice daily, continue Remeron 15 mg nightly for depression. I will double Zyprexa as the patient may have strong psychotic component. I requested a head CT scan and ECT consult from Dr. Toni Amend as well.  Social/disposition. The patient will be discharged with his wife. He will follow up with [facility].  Past psychiatric history. The patient has a history of mild depression.  Family psychiatric history none reported.  Principal Problem: Severe episode of recurrent major depressive disorder, without psychotic features (HCC) Diagnosis:   Patient Active Problem List   Diagnosis Date Noted  . Diabetes (HCC) [E11.9] 02/21/2017  . HTN (hypertension) [I10] 02/21/2017  . GERD (gastroesophageal reflux disease) [K21.9] 02/21/2017  . Vitamin B12 deficiency [E53.8] 02/21/2017  . Severe episode of recurrent major depressive disorder, without psychotic features (HCC) [F33.2] 02/20/2017  . 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 Chest pain [R07.9] 10/29/2016  . Sinus tachycardia [R00.0] 10/29/2016  . Hyponatremia [E87.1] 10/29/2016  . Hypokalemia [E87.6] 10/29/2016    Total Time spent with patient: 30 minutes  Past Medical History:  Past Medical History:  Diagnosis Date  . Anemia   . Anxiety   . Depression   . Diabetes mellitus without complication (HCC)   . DOE (dyspnea on exertion)   . Early satiety   . Epigastric abdominal pain   . GERD (gastroesophageal reflux disease)   . Hyperlipidemia   . Irritable bowel syndrome   . Shingles   . Tachycardia   . Unintended weight loss     Past Surgical History:  Procedure Laterality Date  . COLONOSCOPY WITH PROPOFOL N/A 11/19/2016   Procedure: COLONOSCOPY WITH PROPOFOL;  Surgeon: Scot Jun, MD;  Location: [facility] ENDOSCOPY;  Service: Endoscopy;  Laterality: N/A;  . ESOPHAGOGASTRODUODENOSCOPY (EGD) WITH PROPOFOL N/A 11/06/2016   Procedure: ESOPHAGOGASTRODUODENOSCOPY (EGD) WITH PROPOFOL;  Surgeon: Scot Jun, MD;  Location: [facility] ENDOSCOPY;  Service: Endoscopy;  Laterality: N/A;  . FRACTURE SURGERY Left 2010   Clavicle  . TONSILLECTOMY    . TOOTH EXTRACTION     wisdom teeth x 4   Family History:  Family History  Problem Relation Age of Onset  . Diabetes Mother   . Heart disease Mother   . Cancer Mother   . CAD Mother   . Heart disease Father   . CAD Father   . Diabetes Sister   . Emphysema Maternal Grandfather   . Diabetes Paternal Grandmother   . Cancer Paternal Grandfather     Social History:  History  Alcohol Use No     History  Drug Use No    Social History   Social History  . Marital status:  Married    Spouse name: N/A  . Number of children: N/A  . Years of education: N/A   Social History Main Topics  . Smoking status: Never Smoker  . Smokeless tobacco: Never Used  . Alcohol use No  . Drug use: No  . Sexual activity: Yes   Other Topics Concern  . None   Social History Narrative  . None   Additional Social History:    Pain Medications: See PTA Prescriptions: See PTA Over the Counter: See PTA History of alcohol / drug use?: No history of alcohol /  drug abuse                    Sleep: Fair  Appetite:  Poor  Current Medications: Current Facility-Administered Medications  Medication Dose Route Frequency Provider Last Rate Last Dose  . acetaminophen (TYLENOL) tablet 650 mg  650 mg Oral Q6H PRN Darliss Ridgel, MD      . alum & mag hydroxide-simeth (MAALOX/MYLANTA) 200-200-20 MG/5ML suspension 30 mL  30 mL Oral Q4H PRN Darliss Ridgel, MD      . aspirin EC tablet 81 mg  81 mg Oral Daily Darliss Ridgel, MD   81 mg at 02/24/17 0852  . atorvastatin (LIPITOR) tablet 20 mg  20 mg Oral Daily Darliss Ridgel, MD   20 mg at 02/24/17 0852  . carbamazepine (TEGRETOL) tablet 200 mg  200 mg Oral BID Darliss Ridgel, MD   200 mg at 02/24/17 1716  . cholecalciferol (VITAMIN D) tablet 5,000 Units  5,000 Units Oral Daily Darliss Ridgel, MD   5,000 Units at 02/24/17 [phone]  . DULoxetine (CYMBALTA) DR capsule 60 mg  60 mg Oral Daily Darliss Ridgel, MD   60 mg at 02/24/17 0853  . feeding supplement (GLUCERNA SHAKE) (GLUCERNA SHAKE) liquid 237 mL  237 mL Oral BID BM Darliss Ridgel, MD   237 mL at 02/24/17 1422  . folic acid (FOLVITE) tablet 0.5 mg  0.5 mg Oral Daily Darliss Ridgel, MD   0.5 mg at 02/24/17 0852  . gabapentin (NEURONTIN) capsule 600 mg  600 mg Oral TID Darliss Ridgel, MD   600 mg at 02/24/17 1715  . glimepiride (AMARYL) tablet 2 mg  2 mg Oral BID Darliss Ridgel, MD   2 mg at 02/24/17 0851  . lidocaine (LIDODERM) 5 % 1 patch  1 patch Transdermal Q24H Darliss Ridgel, MD   1 patch at 02/24/17 [phone]  . magnesium hydroxide (MILK OF MAGNESIA) suspension 30 mL  30 mL Oral Daily PRN Darliss Ridgel, MD      . metFORMIN (GLUCOPHAGE-XR) 24 hr tablet 500 mg  500 mg Oral BID Darliss Ridgel, MD   500 mg at 02/24/17 1716  . metoprolol tartrate (LOPRESSOR) tablet 12.5 mg  12.5 mg Oral BID Darliss Ridgel, MD   12.5 mg at 02/24/17 1715  . mirtazapine (REMERON) tablet 15 mg  15 mg Oral QHS Darliss Ridgel, MD   15 mg at 02/23/17 2140  . multivitamin with  minerals tablet 1 tablet  1 tablet Oral Daily Darliss Ridgel, MD   1 tablet at 02/24/17 0851  . OLANZapine (ZYPREXA) tablet 5 mg  5 mg Oral QHS Dosha Broshears B, MD   5 mg at 02/23/17 2140  . omega-3 acid ethyl esters (LOVAZA) capsule 2 g  2 g Oral Daily Darliss Ridgel, MD   2 g at 02/24/17 0851  .  pantoprazole (PROTONIX) EC tablet 40 mg  40 mg Oral Daily Darliss Ridgel, MD   40 mg at 02/24/17 0852  . protein supplement (PREMIER PROTEIN) liquid  11 oz Oral Q24H Darliss Ridgel, MD   11 oz at 02/23/17 2100  . sucralfate (CARAFATE) tablet 1 g  1 g Oral TID WC & HS Darliss Ridgel, MD   1 g at 02/24/17 1224  . vitamin B-12 (CYANOCOBALAMIN) tablet 100 mcg  100 mcg Oral Daily Darliss Ridgel, MD   100 mcg at 02/24/17 1610    Lab Results: No results found for this or any previous visit (from the past 48 hour(s)).  Blood Alcohol level:  No results found for: [facility]  Metabolic Disorder Labs: Lab Results  Component Value Date   HGBA1C 6.5 (H) 02/20/2017   MPG 139.85 02/20/2017   MPG 148 10/29/2016   No results found for: PROLACTIN Lab Results  Component Value Date   CHOL 158 02/20/2017   TRIG 215 (H) 02/20/2017   HDL 45 02/20/2017   CHOLHDL 3.5 02/20/2017   VLDL 43 (H) 02/20/2017   LDLCALC 70 02/20/2017    Physical Findings: AIMS:  , ,  ,  ,    CIWA:  CIWA-Ar Total: 2 COWS:  COWS Total Score: 2  Musculoskeletal: Strength & Muscle Tone: within normal limits Gait & Station: normal Patient leans: N/A  Psychiatric Specialty Exam: Physical Exam  Nursing note and vitals reviewed. Psychiatric: Judgment normal. His affect is blunt. His speech is delayed. He is slowed and withdrawn. Cognition and memory are impaired. He exhibits a depressed mood. He expresses suicidal ideation. He expresses suicidal plans.    Review of Systems  Neurological: Negative.   Psychiatric/Behavioral:       Somatic delusions  All other systems reviewed and are negative.   Blood pressure 123/83, pulse 97,  temperature 98.2 F (36.8 C), resp. rate 18, height  (1.727 m), weight 62.6 kg (138 lb), SpO2 100 %.Body mass index is 20.98 kg/m.  General Appearance: Disheveled  Eye Contact:  Good  Speech:  Slow  Volume:  Decreased  Mood:  Depressed and Hopeless  Affect:  Blunt  Thought Process:  Goal Directed and Descriptions of Associations: Intact  Orientation:  Full (Time, Place, and Person)  Thought Content:  Delusions  Suicidal Thoughts:  Yes.  with intent/plan  Homicidal Thoughts:  No  Memory:  Immediate;   Fair Recent;   Fair Remote;   Fair  Judgement:  Poor  Insight:  Lacking  Psychomotor Activity:  Psychomotor Retardation  Concentration:  Concentration: Fair and Attention Span: Fair  Recall:  [employer] of Knowledge:  Fair  Language:  Fair  Akathisia:  No  Handed:  Right  AIMS (if indicated):     Assets:  Communication Skills Desire for Improvement Financial Resources/Insurance Housing Physical Health Resilience Social Support Transportation  ADL's:  Intact  Cognition:  WNL  Sleep:  Number of Hours: 8     Treatment Plan Summary: Daily contact with patient to assess and evaluate symptoms and progress in treatment and Medication management   Jeffrey Velazquez a 56 year old male with new onset depression admitted for worsening of his symptoms in the face of severe psychosocial stressors.   1. Mood. We will increase Cymbalta to 60 mg twice daily and continue Remeron for depression. I will increase Zyprexa to 20 mg to address delusional thinking. I requested ECT consult.  2. Neuralgia. The patient has been maintained on Tegretol 200  mg twice daily.  3. Diabetes. The patient is on metformin and Glimepiride, and ADA diet.  4. Hypertension. He is on metoprolol  5. Dyslipidemia. Continue Lipitor.  6. GERD. He is on omeprazole.   7. Weight loss. We offered Glucerna.   8. Chronic pain. He is on tramadol as needed.   9. Hypertension. We continue  metoprolol.  10. Vitamin B12 deficiency. We will continue supplementation. Vitamin B12 level is low will give injections for 3 days.  11. Metabolic syndrome monitoring.  Lipid panel shows elevated triglycerides, normal TSH and hemoglobin A1c.  12. EKG. Pending.  13.  Dementia workup. We ordered an RPR and HIV testing.  14. We ordered a head CT scan.   15. Disposition. The patient will be discharged with his wife. He will follow up with [facility].   Kristine Linea, MD 02/24/2017, 5:28 PM

## 2017-02-24 NOTE — Progress Notes (Signed)
D: Pt A & O X4. Denies SI, HI, AVH and pain at time of assessment. Present with flat affect and depressed mood on initial contact. Brightens up on interaction and forwards during conversation. Reports fair sleep, good appetite with low energy and good concentration level. Rates his depression 2/10, hopelessness 2/10 and anxiety 3/10 on self inventory sheet. Per pt " my belly was hurting early this morning coming up to my chest, I used the bathroom and I feel better now". Observed in scheduled group acitivities this shift.  A: Emotional support and availability provided to pt. Medications administered with verbal education and effects monitored. First dose of B12 given this evening. Unable to obtain EKG this shift, per EKG machine all leads were off despite being secured in place. Charge RN called to verified placement. Respiratory notified of event. States will send someone to to come assess equipment. Safety checks maintained at Q 15 minutes intervals without self harm gestures thus far. R: Pt receptive to care. Reports improvement in mood "I feel somewhat better today". Tolerates all PO intake well. Takes his medications without issues. Denies adverse drug reactions or concerns at this time.

## 2017-02-25 ENCOUNTER — Inpatient Hospital Stay: Payer: 59

## 2017-02-25 LAB — HIV ANTIBODY (ROUTINE TESTING W REFLEX): HIV Screen 4th Generation wRfx: NONREACTIVE

## 2017-02-25 LAB — RPR: RPR Ser Ql: NONREACTIVE

## 2017-02-25 NOTE — [facility] Group Notes (Signed)
[facility] LCSW Group Therapy Note  Date/Time: 02/25/17  Type of Therapy/Topic:  Group Therapy:  Balance in Life  Participation Level:  moderate  Description of Group:    This group will address the concept of balance and how it feels and looks when one is unbalanced. Patients will be encouraged to process areas in their lives that are out of balance, and identify reasons for remaining unbalanced. Facilitators will guide patients utilizing problem- solving interventions to address and correct the stressor making their life unbalanced. Understanding and applying boundaries will be explored and addressed for obtaining  and maintaining a balanced life. Patients will be encouraged to explore ways to assertively make their unbalanced needs known to significant others in their lives, using other group members and facilitator for support and feedback.  Therapeutic Goals: 1. Patient will identify two or more emotions or situations they have that consume much of in their lives. 2. Patient will identify signs/triggers that life has become out of balance:  3. Patient will identify two ways to set boundaries in order to achieve balance in their lives:  4. Patient will demonstrate ability to communicate their needs through discussion and/or role plays  Summary of Patient Progress:Pt identified physical and financial as areas that are out of balance.  Pt participated in group discussion regarding recognizing and adjusting for out of balance areas of life in order to handle the stress.           Therapeutic Modalities:   Cognitive Behavioral Therapy Solution-Focused Therapy Assertiveness Training  Daleen Squibb, [place]

## 2017-02-25 NOTE — Progress Notes (Signed)
[facility] MD Progress Note  02/25/2017 3:36 PM Jeffrey Velazquez  MRN:  [id number]  Subjective:  Jeffrey Velazquez is a 56 year old male with a history of depression admitted for worsening of his symptoms and suicidal ideation with a plan in the context of severe social stressors.  Today the patient has no improvement. He is still depressed, anxious, and suicidal. He secluded to his room. He sleeps most of the day. He tries to participate in programming but finds it very difficult due to poor energy and concentration. He accepts medication and seems to tolerate them well. He denies further weight loss he lost 60 pounds in the past several months. All additional testing with the exception of low vitamin B12 is negative.  Treatment plan. The patient has been maintained on a combination of Cymbalta, Remeron, and Zyprexa. In spite of those increase the patient has not responded.Marland Kitchen ECT consult pending.  Social/disposition. The patient will be discharge with his wife. He will follow up with [facility].  Past psychiatric history. There is a history of mild depression treated with Cymbalta.   Family psychiatric history. Nonreported.  Principal Problem: Severe episode of recurrent major depressive disorder, without psychotic features (HCC) Diagnosis:   Patient Active Problem List   Diagnosis Date Noted  . Diabetes (HCC) [E11.9] 02/21/2017  . HTN (hypertension) [I10] 02/21/2017  . GERD (gastroesophageal reflux disease) [K21.9] 02/21/2017  . Vitamin B12 deficiency [E53.8] 02/21/2017  . Severe episode of recurrent major depressive disorder, without psychotic features (HCC) [F33.2] 02/20/2017  . 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 Chest pain [R07.9] 10/29/2016  . Sinus tachycardia [R00.0] 10/29/2016  . Hyponatremia [E87.1] 10/29/2016  . Hypokalemia [E87.6] 10/29/2016   Total Time spent with patient: 30 minutes  Past Medical History:  Past Medical History:  Diagnosis Date  . Anemia   . Anxiety    . Depression   . Diabetes mellitus without complication (HCC)   . DOE (dyspnea on exertion)   . Early satiety   . Epigastric abdominal pain   . GERD (gastroesophageal reflux disease)   . Hyperlipidemia   . Irritable bowel syndrome   . Shingles   . Tachycardia   . Unintended weight loss     Past Surgical History:  Procedure Laterality Date  . COLONOSCOPY WITH PROPOFOL N/A 11/19/2016   Procedure: COLONOSCOPY WITH PROPOFOL;  Surgeon: Scot Jun, MD;  Location: [facility] ENDOSCOPY;  Service: Endoscopy;  Laterality: N/A;  . ESOPHAGOGASTRODUODENOSCOPY (EGD) WITH PROPOFOL N/A 11/06/2016   Procedure: ESOPHAGOGASTRODUODENOSCOPY (EGD) WITH PROPOFOL;  Surgeon: Scot Jun, MD;  Location: [facility] ENDOSCOPY;  Service: Endoscopy;  Laterality: N/A;  . FRACTURE SURGERY Left 2010   Clavicle  . TONSILLECTOMY    . TOOTH EXTRACTION     wisdom teeth x 4   Family History:  Family History  Problem Relation Age of Onset  . Diabetes Mother   . Heart disease Mother   . Cancer Mother   . CAD Mother   . Heart disease Father   . CAD Father   . Diabetes Sister   . Emphysema Maternal Grandfather   . Diabetes Paternal Grandmother   . Cancer Paternal Grandfather     Social History:  History  Alcohol Use No     History  Drug Use No    Social History   Social History  . Marital status: Married    Spouse name: N/A  . Number of children: N/A  . Years of education: N/A   Social History Main  Topics  . Smoking status: Never Smoker  . Smokeless tobacco: Never Used  . Alcohol use No  . Drug use: No  . Sexual activity: Yes   Other Topics Concern  . None   Social History Narrative  . None   Additional Social History:    Pain Medications: See PTA Prescriptions: See PTA Over the Counter: See PTA History of alcohol / drug use?: No history of alcohol / drug abuse                    Sleep: Fair  Appetite:  Fair  Current Medications: Current Facility-Administered Medications   Medication Dose Route Frequency Provider Last Rate Last Dose  . acetaminophen (TYLENOL) tablet 650 mg  650 mg Oral Q6H PRN Darliss Ridgel, MD      . alum & mag hydroxide-simeth (MAALOX/MYLANTA) 200-200-20 MG/5ML suspension 30 mL  30 mL Oral Q4H PRN Darliss Ridgel, MD      . aspirin EC tablet 81 mg  81 mg Oral Daily Darliss Ridgel, MD   81 mg at 02/25/17 0919  . atorvastatin (LIPITOR) tablet 20 mg  20 mg Oral Daily Darliss Ridgel, MD   20 mg at 02/25/17 0920  . carbamazepine (TEGRETOL) tablet 200 mg  200 mg Oral BID Darliss Ridgel, MD   200 mg at 02/25/17 0921  . cholecalciferol (VITAMIN D) tablet 5,000 Units  5,000 Units Oral Daily Darliss Ridgel, MD   5,000 Units at 02/25/17 0920  . cyanocobalamin ((VITAMIN B-12)) injection 1,000 mcg  1,000 mcg Intramuscular Daily Meklit Cotta B, MD   1,000 mcg at 02/25/17 0922  . DULoxetine (CYMBALTA) DR capsule 60 mg  60 mg Oral BID Nakia Koble B, MD   60 mg at 02/25/17 0920  . feeding supplement (GLUCERNA SHAKE) (GLUCERNA SHAKE) liquid 237 mL  237 mL Oral BID BM Darliss Ridgel, MD   237 mL at 02/24/17 1422  . folic acid (FOLVITE) tablet 0.5 mg  0.5 mg Oral Daily Darliss Ridgel, MD   0.5 mg at 02/25/17 0921  . gabapentin (NEURONTIN) capsule 600 mg  600 mg Oral TID Darliss Ridgel, MD   600 mg at 02/25/17 1452  . glimepiride (AMARYL) tablet 2 mg  2 mg Oral BID Darliss Ridgel, MD   2 mg at 02/25/17 0920  . lidocaine (LIDODERM) 5 % 1 patch  1 patch Transdermal Q24H Darliss Ridgel, MD   1 patch at 02/25/17 [phone]  . magnesium hydroxide (MILK OF MAGNESIA) suspension 30 mL  30 mL Oral Daily PRN Darliss Ridgel, MD      . metFORMIN (GLUCOPHAGE-XR) 24 hr tablet 500 mg  500 mg Oral BID Darliss Ridgel, MD   500 mg at 02/25/17 0921  . metoprolol tartrate (LOPRESSOR) tablet 12.5 mg  12.5 mg Oral BID Darliss Ridgel, MD   12.5 mg at 02/25/17 1191  . mirtazapine (REMERON) tablet 15 mg  15 mg Oral QHS Darliss Ridgel, MD   15 mg at 02/24/17 2131  . multivitamin with  minerals tablet 1 tablet  1 tablet Oral Daily Darliss Ridgel, MD   1 tablet at 02/25/17 0920  . OLANZapine (ZYPREXA) tablet 15 mg  15 mg Oral QHS Zuria Fosdick B, MD   15 mg at 02/24/17 2131  . omega-3 acid ethyl esters (LOVAZA) capsule 2 g  2 g Oral Daily Darliss Ridgel, MD   2 g at 02/25/17 0920  .  pantoprazole (PROTONIX) EC tablet 40 mg  40 mg Oral Daily Darliss Ridgel, MD   40 mg at 02/25/17 0921  . protein supplement (PREMIER PROTEIN) liquid  11 oz Oral Q24H Darliss Ridgel, MD   11 oz at 02/24/17 2129  . sucralfate (CARAFATE) tablet 1 g  1 g Oral TID WC & HS Darliss Ridgel, MD   1 g at 02/25/17 1452    Lab Results:  Results for orders placed or performed during the hospital encounter of 02/20/17 (from the past 48 hour(s))  RPR     Status: None   Collection Time: 02/24/17  6:01 PM  Result Value Ref Range   RPR Ser Ql Non Reactive Non Reactive    Comment: (NOTE) Performed At: [facility] [address] [place], [place] [id number] Mila Homer MD [id number]   HIV antibody     Status: None   Collection Time: 02/24/17  6:01 PM  Result Value Ref Range   HIV Screen 4th Generation wRfx Non Reactive Non Reactive    Comment: (NOTE) Performed At: [facility] [address] [place], [place] [id number] Mila Homer MD BM:[id number]     Blood Alcohol level:  No results found for: [facility]  Metabolic Disorder Labs: Lab Results  Component Value Date   HGBA1C 6.5 (H) 02/20/2017   MPG 139.85 02/20/2017   MPG 148 10/29/2016   No results found for: PROLACTIN Lab Results  Component Value Date   CHOL 158 02/20/2017   TRIG 215 (H) 02/20/2017   HDL 45 02/20/2017   CHOLHDL 3.5 02/20/2017   VLDL 43 (H) 02/20/2017   LDLCALC 70 02/20/2017    Physical Findings: AIMS:  , ,  ,  ,    CIWA:  CIWA-Ar Total: 2 COWS:  COWS Total Score: 2  Musculoskeletal: Strength & Muscle Tone: within normal limits Gait & Station: normal Patient leans: N/A  Psychiatric  Specialty Exam: Physical Exam  Nursing note and vitals reviewed. Psychiatric: Judgment normal. His affect is blunt. His speech is delayed. He is slowed and withdrawn. Cognition and memory are impaired. He expresses suicidal ideation.    Review of Systems  Constitutional: Positive for malaise/fatigue and weight loss.  Neurological: Negative.   Psychiatric/Behavioral: Positive for depression and suicidal ideas. The patient is nervous/anxious.   All other systems reviewed and are negative.   Blood pressure 119/70, pulse (!) 105, temperature 98.1 F (36.7 C), temperature source Oral, resp. rate 18, height  (1.727 m), weight 62.6 kg (138 lb), SpO2 100 %.Body mass index is 20.98 kg/m.  General Appearance: Fairly Groomed  Eye Contact:  Minimal  Speech:  Slow  Volume:  Decreased  Mood:  Depressed  Affect:  Blunt  Thought Process:  Goal Directed and Descriptions of Associations: Intact  Orientation:  Full (Time, Place, and Person)  Thought Content:  WDL  Suicidal Thoughts:  Yes.  without intent/plan  Homicidal Thoughts:  No  Memory:  Immediate;   Fair Recent;   Fair Remote;   Fair  Judgement:  Fair  Insight:  Shallow  Psychomotor Activity:  Psychomotor Retardation  Concentration:  Concentration: Fair and Attention Span: Fair  Recall:  [employer] of Knowledge:  Fair  Language:  Fair  Akathisia:  No  Handed:  Right  AIMS (if indicated):     Assets:  Communication Skills Desire for Improvement Financial Resources/Insurance Housing Intimacy Resilience Social Support  ADL's:  Intact  Cognition:  WNL  Sleep:  Number of Hours:  7     Treatment Plan Summary: Daily contact with patient to assess and evaluate symptoms and progress in treatment and Medication management   Mr. Acy a 56 year old male with new onset depression admitted for worsening of his symptoms in the face of severe psychosocial stressors.   1. Mood. We will increase Cymbalta to 60 mg twice daily. We  continued Remeron 15 mg nightly for depression and Zyprexa 20 mg nightly for presumed delusional thinking. ECT consult pending.   2. Neuralgia. The patient has been maintained on Tegretol 200 mg twice daily.  3. Diabetes. The patient is on metformin and Glimepiride, and ADA diet.  4. Hypertension. He is on metoprolol  5. Dyslipidemia. Continue Lipitor.  6. GERD. He is on omeprazole.   7. Weight loss. We offered Glucerna.   8. Chronic pain. He is on tramadol as needed.   9. Hypertension. We continue metoprolol.  10. Vitamin B12 deficiency. We will continue supplementation. Vitamin B12 level is low will give injections for 3 days.  11. Metabolic syndrome monitoring.  Lipid panel shows elevated triglycerides, normal TSH and hemoglobin A1c.  12. EKG. Still pending.  13.  Dementia workup. RPR and HIV testing are negative.   14. Head CT scan is unremarkable.    15. Disposition. The patient will be discharged with his wife. He will follow up with [facility].   Kristine Linea, MD 02/25/2017, 3:36 PM

## 2017-02-25 NOTE — [facility] Group Notes (Signed)
[facility] Group Notes:  (Nursing/MHT/Case Management/Adjunct)  Date:  02/25/2017  Time:  9:49 PM  Type of Therapy:  Group Therapy  Participation Level:  Active  Participation Quality:  Appropriate  Affect:  Appropriate  Cognitive:  Alert  Insight:  Good  Engagement in Group:  Engaged  Modes of Intervention:  Support  Summary of Progress/Problems:  Jeffrey Velazquez 02/25/2017, 9:49 PM

## 2017-02-25 NOTE — Consult Note (Signed)
ECT: Consult for this 56 year old man currently in the hospital with depression weight loss and somatic concern. Dr. Demetrius Charity asked me to speak with the patient about ECT based on his diagnosis of depression and failure so far to respond. Patient states that he feels like he is slightly better than when he came into the hospital. He claims that he is eating a little bit more. He is still very somatically focused and talked at length about his shingles and his allegedly swollen intestines. When asked if he actually felt depressed patient was ambivalent about it saying that he was really not sure. Didn't feel particularly sad. Absolutely denied suicidal ideation. Patient was slightly disheveled but not dirty. Eye contact intermittent. Psychomotor activity still limited.  Chart reviewed. Based on chart review and discussion with treatment team patient seems to be minimizing some of his symptoms and the severity of his illness. Potentially a good candidate for ECT however at this point we would be unable to get him on the schedule earlier than Wednesday the 17th. Discussed treatment with patient and gave him some education about ECT and its utility. Did not ask him to make a commitment to treatment. Patient seemed to be slightly interested. I will follow-up after the weekend to see how he is doing and discuss with the primary psychiatrist again and possibly consider ECT next week.

## 2017-02-25 NOTE — Progress Notes (Signed)
D: Pt denies SI/HI/AVH,, affect is flat, mood is pleasant and cooperative. Patient thoughts are organized and coherent. Patient appears less anxious, still isolates to his room this evening minimal interaction with peers and staff. No distress noted.   A: Pt was offered support and encouragement. Pt was given scheduled medications. Pt was encouraged to attend groups. Q 15 minute checks were done for safety.  R:Pt did not attends group, but is taking medication. Pt is receptive to treatment and safety maintained on unit.

## 2017-02-25 NOTE — [facility] Group Notes (Signed)
Goals Group Date/Time: 02/25/2017 9:00 AM Type of Therapy and Topic: Group Therapy: Goals Group: SMART Goals   Participation Level: Moderate  Description of Group:    The purpose of a daily goals group is to assist and guide patients in setting recovery/wellness-related goals. The objective is to set goals as they relate to the crisis in which they were admitted. Patients will be using SMART goal modalities to set measurable goals. Characteristics of realistic goals will be discussed and patients will be assisted in setting and processing how one will reach their goal. Facilitator will also assist patients in applying interventions and coping skills learned in psycho-education groups to the SMART goal and process how one will achieve defined goal.   Therapeutic Goals:   -Patients will develop and document one goal related to or their crisis in which brought them into treatment.  -Patients will be guided by LCSW using SMART goal setting modality in how to set a measurable, attainable, realistic and time sensitive goal.  -Patients will process barriers in reaching goal.  -Patients will process interventions in how to overcome and successful in reaching goal.   Patient's Goal: Pt goal is to talk to his MD today and to be more active, do stretches.   Therapeutic Modalities:  Motivational Interviewing  Research officer, political party  SMART goals setting   Daleen Squibb, [place]

## 2017-02-26 ENCOUNTER — Other Ambulatory Visit: Payer: Self-pay

## 2017-02-26 MED ORDER — OLANZAPINE 10 MG PO TABS
20.0000 mg | ORAL_TABLET | Freq: Every day | ORAL | Status: DC
  Administered 2017-02-26 – 2017-03-01 (×4): 20 mg via ORAL
  Filled 2017-02-26 (×4): qty 2

## 2017-02-26 MED ORDER — VENLAFAXINE HCL ER 75 MG PO CP24: 225.0000 mg | ORAL_CAPSULE | Freq: Every day | ORAL | Status: DC

## 2017-02-26 MED ORDER — VENLAFAXINE HCL ER 75 MG PO CP24
300.0000 mg | ORAL_CAPSULE | Freq: Every day | ORAL | Status: DC
  Administered 2017-02-27 – 2017-03-02 (×4): 300 mg via ORAL
  Filled 2017-02-26 (×4): qty 4

## 2017-02-26 NOTE — Progress Notes (Signed)
Patient attend spiritual groups today He shared about the loss of his job, his house, and his poor health. Pt made positive contribution to group discussion and was attentive and well involved.    02/26/17 1600  Clinical Encounter Type  Visited With Patient;Health care provider  Visit Type Initial;Psychological support  Referral From Nurse  Consult/Referral To Chaplain  Spiritual Encounters  Spiritual Needs Emotional;Other (Comment)

## 2017-02-26 NOTE — Progress Notes (Addendum)
[facility] MD Progress Note  02/26/2017 3:54 PM Jeffrey Velazquez  MRN:  [id number]  Subjective:  Jeffrey Velazquez is a 56 year old male with a history of depression admitted for worsening of his symptoms and suicidal ideation with a plan in the context of severe social stressors.  Today the patient appears to feel slightly better. He reported some improvement with Dr. Toni Amend yesterday during ECT consultation. He is out of his room today slowly walking the hallways, smiling a little. He did go to group. He still spends a lot of time in his room but overall feels much more optimistic. He will consider ECT if necessary. He is no longer suicidal. He reports that his sleep and appetite. He believes that he is gaining weight in the hospital after he had lost 60 pounds. He is still preoccupied with shingles and swelling of his intestines and can talk about 4 hours. I wonder if he started diagnosis is depression with psychotic features with somatic delusions.   Treatment plan. I will discontinue Cymbalta and Remeron and start Effexor 300 mg daily for depression. I will increase Zyprexa to 20 mg at bedtime for psychosis. If necessary the patient could start ECT on Wednesday.   Social/disposition. The patient will be discharge with his wife. He will follow up with [facility].  Past psychiatric history. There is a history of mild depression treated with Cymbalta.   Family psychiatric history. Nonreported.  No other new information was available today.  Principal Problem: Severe episode of recurrent major depressive disorder, without psychotic features (HCC) Diagnosis:   Patient Active Problem List   Diagnosis Date Noted  . Diabetes (HCC) [E11.9] 02/21/2017  . HTN (hypertension) [I10] 02/21/2017  . GERD (gastroesophageal reflux disease) [K21.9] 02/21/2017  . Vitamin B12 deficiency [E53.8] 02/21/2017  . Severe episode of recurrent major depressive disorder, without psychotic features (HCC) [F33.2] 02/20/2017  .  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 Chest pain [R07.9] 10/29/2016  . Sinus tachycardia [R00.0] 10/29/2016  . Hyponatremia [E87.1] 10/29/2016  . Hypokalemia [E87.6] 10/29/2016   Total Time spent with patient: 30 minutes  Past Medical History:  Past Medical History:  Diagnosis Date  . Anemia   . Anxiety   . Depression   . Diabetes mellitus without complication (HCC)   . DOE (dyspnea on exertion)   . Early satiety   . Epigastric abdominal pain   . GERD (gastroesophageal reflux disease)   . Hyperlipidemia   . Irritable bowel syndrome   . Shingles   . Tachycardia   . Unintended weight loss     Past Surgical History:  Procedure Laterality Date  . COLONOSCOPY WITH PROPOFOL N/A 11/19/2016   Procedure: COLONOSCOPY WITH PROPOFOL;  Surgeon: Scot Jun, MD;  Location: [facility] ENDOSCOPY;  Service: Endoscopy;  Laterality: N/A;  . ESOPHAGOGASTRODUODENOSCOPY (EGD) WITH PROPOFOL N/A 11/06/2016   Procedure: ESOPHAGOGASTRODUODENOSCOPY (EGD) WITH PROPOFOL;  Surgeon: Scot Jun, MD;  Location: [facility] ENDOSCOPY;  Service: Endoscopy;  Laterality: N/A;  . FRACTURE SURGERY Left 2010   Clavicle  . TONSILLECTOMY    . TOOTH EXTRACTION     wisdom teeth x 4   Family History:  Family History  Problem Relation Age of Onset  . Diabetes Mother   . Heart disease Mother   . Cancer Mother   . CAD Mother   . Heart disease Father   . CAD Father   . Diabetes Sister   . Emphysema Maternal Grandfather   . Diabetes Paternal Grandmother   .  Cancer Paternal Grandfather     Social History:  History  Alcohol Use No     History  Drug Use No    Social History   Social History  . Marital status: Married    Spouse name: N/A  . Number of children: N/A  . Years of education: N/A   Social History Main Topics  . Smoking status: Never Smoker  . Smokeless tobacco: Never Used  . Alcohol use No  . Drug use: No  . Sexual activity: Yes   Other Topics Concern  . None   Social History  Narrative  . None   Additional Social History:    Pain Medications: See PTA Prescriptions: See PTA Over the Counter: See PTA History of alcohol / drug use?: No history of alcohol / drug abuse                    Sleep: Fair  Appetite:  Fair  Current Medications: Current Facility-Administered Medications  Medication Dose Route Frequency Provider Last Rate Last Dose  . acetaminophen (TYLENOL) tablet 650 mg  650 mg Oral Q6H PRN Darliss Ridgel, MD      . alum & mag hydroxide-simeth (MAALOX/MYLANTA) 200-200-20 MG/5ML suspension 30 mL  30 mL Oral Q4H PRN Darliss Ridgel, MD      . aspirin EC tablet 81 mg  81 mg Oral Daily Darliss Ridgel, MD   81 mg at 02/26/17 0825  . atorvastatin (LIPITOR) tablet 20 mg  20 mg Oral Daily Darliss Ridgel, MD   20 mg at 02/26/17 0823  . carbamazepine (TEGRETOL) tablet 200 mg  200 mg Oral BID Darliss Ridgel, MD   200 mg at 02/26/17 0824  . cholecalciferol (VITAMIN D) tablet 5,000 Units  5,000 Units Oral Daily Darliss Ridgel, MD   5,000 Units at 02/26/17 [phone]  . DULoxetine (CYMBALTA) DR capsule 60 mg  60 mg Oral BID Ireene Ballowe B, MD   60 mg at 02/26/17 0823  . feeding supplement (GLUCERNA SHAKE) (GLUCERNA SHAKE) liquid 237 mL  237 mL Oral BID BM Darliss Ridgel, MD   237 mL at 02/24/17 1422  . folic acid (FOLVITE) tablet 0.5 mg  0.5 mg Oral Daily Darliss Ridgel, MD   0.5 mg at 02/26/17 0824  . gabapentin (NEURONTIN) capsule 600 mg  600 mg Oral TID Darliss Ridgel, MD   600 mg at 02/26/17 1209  . glimepiride (AMARYL) tablet 2 mg  2 mg Oral BID Darliss Ridgel, MD   2 mg at 02/26/17 0824  . lidocaine (LIDODERM) 5 % 1 patch  1 patch Transdermal Q24H Darliss Ridgel, MD   1 patch at 02/26/17 0825  . magnesium hydroxide (MILK OF MAGNESIA) suspension 30 mL  30 mL Oral Daily PRN Darliss Ridgel, MD      . metFORMIN (GLUCOPHAGE-XR) 24 hr tablet 500 mg  500 mg Oral BID Darliss Ridgel, MD   500 mg at 02/26/17 0824  . metoprolol tartrate (LOPRESSOR) tablet 12.5  mg  12.5 mg Oral BID Darliss Ridgel, MD   12.5 mg at 02/26/17 0824  . mirtazapine (REMERON) tablet 15 mg  15 mg Oral QHS Darliss Ridgel, MD   15 mg at 02/25/17 2119  . multivitamin with minerals tablet 1 tablet  1 tablet Oral Daily Darliss Ridgel, MD   1 tablet at 02/26/17 [phone]  . OLANZapine (ZYPREXA) tablet 15 mg  15 mg Oral QHS Marcellius Montagna,  Ellin Goodie, MD   15 mg at 02/25/17 2119  . omega-3 acid ethyl esters (LOVAZA) capsule 2 g  2 g Oral Daily Darliss Ridgel, MD   2 g at 02/26/17 1610  . pantoprazole (PROTONIX) EC tablet 40 mg  40 mg Oral Daily Darliss Ridgel, MD   40 mg at 02/26/17 0824  . protein supplement (PREMIER PROTEIN) liquid  11 oz Oral Q24H Darliss Ridgel, MD   11 oz at 02/25/17 2100  . sucralfate (CARAFATE) tablet 1 g  1 g Oral TID WC & HS Darliss Ridgel, MD   1 g at 02/26/17 1209    Lab Results:  Results for orders placed or performed during the hospital encounter of 02/20/17 (from the past 48 hour(s))  RPR     Status: None   Collection Time: 02/24/17  6:01 PM  Result Value Ref Range   RPR Ser Ql Non Reactive Non Reactive    Comment: (NOTE) Performed At: [facility] [address] [place], [place] [id number] Mila Homer MD JX:[id number]   HIV antibody     Status: None   Collection Time: 02/24/17  6:01 PM  Result Value Ref Range   HIV Screen 4th Generation wRfx Non Reactive Non Reactive    Comment: (NOTE) Performed At: [facility] [address] [place], [place] [id number] Mila Homer MD ON:[id number]     Blood Alcohol level:  No results found for: [facility]  Metabolic Disorder Labs: Lab Results  Component Value Date   HGBA1C 6.5 (H) 02/20/2017   MPG 139.85 02/20/2017   MPG 148 10/29/2016   No results found for: PROLACTIN Lab Results  Component Value Date   CHOL 158 02/20/2017   TRIG 215 (H) 02/20/2017   HDL 45 02/20/2017   CHOLHDL 3.5 02/20/2017   VLDL 43 (H) 02/20/2017   LDLCALC 70 02/20/2017    Physical Findings: AIMS:  , ,  ,   ,    CIWA:  CIWA-Ar Total: 2 COWS:  COWS Total Score: 2  Musculoskeletal: Strength & Muscle Tone: within normal limits Gait & Station: normal Patient leans: N/A  Psychiatric Specialty Exam: Physical Exam  Nursing note and vitals reviewed. Psychiatric: Judgment and thought content normal. His affect is blunt. His speech is delayed. He is slowed and withdrawn. Cognition and memory are impaired.    Review of Systems  Constitutional: Positive for malaise/fatigue and weight loss.  Neurological: Negative.   Psychiatric/Behavioral: Positive for depression and suicidal ideas. The patient is nervous/anxious.   All other systems reviewed and are negative.   Blood pressure 101/67, pulse (!) 104, temperature 98.2 F (36.8 C), temperature source Oral, resp. rate 16, height  (1.727 m), weight 62.6 kg (138 lb), SpO2 100 %.Body mass index is 20.98 kg/m.  General Appearance: Fairly Groomed  Eye Contact:  Minimal  Speech:  Slow  Volume:  Decreased  Mood:  Depressed  Affect:  Blunt  Thought Process:  Goal Directed and Descriptions of Associations: Intact  Orientation:  Full (Time, Place, and Person)  Thought Content:  WDL  Suicidal Thoughts:  Yes.  without intent/plan  Homicidal Thoughts:  No  Memory:  Immediate;   Fair Recent;   Fair Remote;   Fair  Judgement:  Fair  Insight:  Shallow  Psychomotor Activity:  Psychomotor Retardation  Concentration:  Concentration: Fair and Attention Span: Fair  Recall:  [employer] of Knowledge:  Fair  Language:  Fair  Akathisia:  No  Handed:  Right  AIMS (if indicated):     Assets:  Communication Skills Desire for Improvement Financial Resources/Insurance Housing Intimacy Resilience Social Support  ADL's:  Intact  Cognition:  WNL  Sleep:  Number of Hours: 8     Treatment Plan Summary: Daily contact with patient to assess and evaluate symptoms and progress in treatment and Medication management   Mr. Ryland a 56 year old male  with new onset depression admitted for worsening of his symptoms in the face of severe psychosocial stressors.   1. Mood. I will discontinue Cymbalta and Remeron and start Effexor 300 mg daily for depression. I will increase Zyprexa 20 mg nightly for delusional thinking. We still consider ECT.    2. Neuralgia. The patient has been maintained on Tegretol 200 mg twice daily.  3. Diabetes. The patient is on metformin and Glimepiride, and ADA diet.  4. Hypertension. He is on metoprolol  5. Dyslipidemia. Continue Lipitor.  6. GERD. He is on omeprazole.   7. Weight loss. We offered Glucerna.   8. Chronic pain. He is on tramadol as needed.   9. Hypertension. We continue metoprolol.  10. Vitamin B12 deficiency. We will continue supplementation. Vitamin B12 level is low will give injections for 3 days.  11. Metabolic syndrome monitoring.  Lipid panel shows elevated triglycerides, normal TSH and hemoglobin A1c.  12. EKG. Has not been done in spite of multiple requests.  13.  Dementia workup. RPR and HIV testing are negative.   14. Head CT scan is unremarkable.    15. Disposition. The patient will be discharged with his wife. He will follow up with [facility].   Kristine Linea, MD 02/26/2017, 3:54 PM

## 2017-02-26 NOTE — Progress Notes (Signed)
Affect sad but brightens with engagement.  Verbalizes that he is starting to feel better.  Rates depression as 3/10.  Attending groups.  Denies SI/HI/AVH.  Support and encouragement offered.  Safety rounds maintained.

## 2017-02-26 NOTE — Plan of Care (Signed)
Problem: Coping: Goal: Ability to verbalize frustrations and anger appropriately will improve Outcome: Progressing Patient was observed talking with spouse this evening.  He was calm and smiling.  Patient was without evidence of anger or stress.  Problem: Safety: Goal: Ability to disclose and discuss suicidal ideas will improve Outcome: Progressing Patient denies SI/HI/AV/H and contracts for safety.

## 2017-02-26 NOTE — Progress Notes (Signed)
D: Pt denies SI/HI/AVH.,affect is flat and sad, mood is pleasant and cooperative. Pt appears less anxious and he is interacting with peers and staff appropriately. Patient's thoughts are organized, no distress noted at this time.  A: Pt was offered support and encouragement. Pt was given scheduled medications. Pt was encouraged to attend groups. Q 15 minute checks were done for safety.  R:Pt did not attend evening wrap up group, Pt is taking medication. Pt receptive to treatment and safety maintained on unit, will continue to monitor.

## 2017-02-26 NOTE — Tx Team (Signed)
Interdisciplinary Treatment and Diagnostic Plan Update  02/26/2017 Time of Session: 1115am Jeffrey Velazquez MRN: [id number]  Principal Diagnosis: Severe episode of recurrent major depressive disorder, without psychotic features (HCC)  Secondary Diagnoses: Principal Problem:   Severe episode of recurrent major depressive disorder, without psychotic features (HCC) Active Problems:   Insomnia due to mental disorder   Diabetes (HCC)   HTN (hypertension)   GERD (gastroesophageal reflux disease)   Vitamin B12 deficiency   Current Medications:  Current Facility-Administered Medications  Medication Dose Route Frequency Provider Last Rate Last Dose  . acetaminophen (TYLENOL) tablet 650 mg  650 mg Oral Q6H PRN Darliss Ridgel, MD      . alum & mag hydroxide-simeth (MAALOX/MYLANTA) 200-200-20 MG/5ML suspension 30 mL  30 mL Oral Q4H PRN Darliss Ridgel, MD      . aspirin EC tablet 81 mg  81 mg Oral Daily Darliss Ridgel, MD   81 mg at 02/26/17 0825  . atorvastatin (LIPITOR) tablet 20 mg  20 mg Oral Daily Darliss Ridgel, MD   20 mg at 02/26/17 0823  . carbamazepine (TEGRETOL) tablet 200 mg  200 mg Oral BID Darliss Ridgel, MD   200 mg at 02/26/17 0824  . cholecalciferol (VITAMIN D) tablet 5,000 Units  5,000 Units Oral Daily Darliss Ridgel, MD   5,000 Units at 02/26/17 [phone]  . DULoxetine (CYMBALTA) DR capsule 60 mg  60 mg Oral BID Pucilowska, Jolanta B, MD   60 mg at 02/26/17 0823  . feeding supplement (GLUCERNA SHAKE) (GLUCERNA SHAKE) liquid 237 mL  237 mL Oral BID BM Darliss Ridgel, MD   237 mL at 02/24/17 1422  . folic acid (FOLVITE) tablet 0.5 mg  0.5 mg Oral Daily Darliss Ridgel, MD   0.5 mg at 02/26/17 0824  . gabapentin (NEURONTIN) capsule 600 mg  600 mg Oral TID Darliss Ridgel, MD   600 mg at 02/26/17 1209  . glimepiride (AMARYL) tablet 2 mg  2 mg Oral BID Darliss Ridgel, MD   2 mg at 02/26/17 0824  . lidocaine (LIDODERM) 5 % 1 patch  1 patch Transdermal Q24H Darliss Ridgel, MD   1 patch at  02/26/17 0825  . magnesium hydroxide (MILK OF MAGNESIA) suspension 30 mL  30 mL Oral Daily PRN Darliss Ridgel, MD      . metFORMIN (GLUCOPHAGE-XR) 24 hr tablet 500 mg  500 mg Oral BID Darliss Ridgel, MD   500 mg at 02/26/17 0824  . metoprolol tartrate (LOPRESSOR) tablet 12.5 mg  12.5 mg Oral BID Darliss Ridgel, MD   12.5 mg at 02/26/17 0824  . mirtazapine (REMERON) tablet 15 mg  15 mg Oral QHS Darliss Ridgel, MD   15 mg at 02/25/17 2119  . multivitamin with minerals tablet 1 tablet  1 tablet Oral Daily Darliss Ridgel, MD   1 tablet at 02/26/17 [phone]  . OLANZapine (ZYPREXA) tablet 15 mg  15 mg Oral QHS Pucilowska, Jolanta B, MD   15 mg at 02/25/17 2119  . omega-3 acid ethyl esters (LOVAZA) capsule 2 g  2 g Oral Daily Darliss Ridgel, MD   2 g at 02/26/17 3086  . pantoprazole (PROTONIX) EC tablet 40 mg  40 mg Oral Daily Darliss Ridgel, MD   40 mg at 02/26/17 0824  . protein supplement (PREMIER PROTEIN) liquid  11 oz Oral Q24H Darliss Ridgel, MD   11 oz at 02/25/17 2100  .  sucralfate (CARAFATE) tablet 1 g  1 g Oral TID WC & HS Darliss Ridgel, MD   1 g at 02/26/17 1209   PTA Medications: Prescriptions Prior to Admission  Medication Sig Dispense Refill Last Dose  . aspirin EC 81 MG tablet Take 81 mg by mouth daily.   Past Week at Unknown time  . atorvastatin (LIPITOR) 20 MG tablet Take 20 mg by mouth daily.   11/18/2016 at Unknown time  . carbamazepine (TEGRETOL) 200 MG tablet Take 200 mg by mouth 2 (two) times daily.  3 11/18/2016 at Unknown time  . cholecalciferol (VITAMIN D) 1000 units tablet Take 5,000 Units by mouth daily.    11/18/2016 at Unknown time  . feeding supplement, GLUCERNA SHAKE, (GLUCERNA SHAKE) LIQD Take 237 mLs by mouth 2 (two) times daily between meals. 60 Can 0   . folic acid (FOLVITE) 400 MCG tablet Take 1 tablet by mouth daily.   11/18/2016 at Unknown time  . gabapentin (NEURONTIN) 300 MG capsule Take 600-900 mg by mouth 3 (three) times daily. Take 600 mg every morning and lunch and 900  mg at bedtime.   11/18/2016 at Unknown time  . glimepiride (AMARYL) 2 MG tablet Take 2 mg by mouth 2 (two) times daily.   11/18/2016 at Unknown time  . Lancets 30G MISC by Does not apply route.   11/18/2016 at Unknown time  . lidocaine (LIDODERM) 5 % Place 1 patch onto the skin daily. Remove & Discard patch within 12 hours or as directed by MD   11/18/2016 at Unknown time  . Melatonin 5 MG TABS Take 10 mg by mouth at bedtime.    11/18/2016 at Unknown time  . metFORMIN (GLUCOPHAGE-XR) 500 MG 24 hr tablet Take 500 mg by mouth 2 (two) times daily.   11/18/2016 at Unknown time  . metoprolol tartrate (LOPRESSOR) 25 MG tablet Take 0.5 tablets (12.5 mg total) by mouth 2 (two) times daily. 60 tablet 0 11/18/2016 at Unknown time  . Multiple Vitamin (MULTI-VITAMINS) TABS Take 1 tablet by mouth daily.   11/18/2016 at Unknown time  . nortriptyline (PAMELOR) 10 MG capsule Take 10 mg by mouth daily.   1 11/18/2016 at Unknown time  . Omega-3 Fatty Acids (FISH OIL PO) Take 1 capsule by mouth 2 (two) times daily.   11/18/2016 at Unknown time  . omeprazole (PRILOSEC) 20 MG capsule Take 2 capsules (40 mg total) by mouth daily. 60 capsule 1 11/18/2016 at Unknown time  . sucralfate (CARAFATE) 1 g tablet Take 1 g by mouth 4 (four) times daily -  with meals and at bedtime.   11/18/2016 at Unknown time  . traMADol (ULTRAM) 50 MG tablet Take 1 tablet (50 mg total) by mouth every 6 (six) hours as needed. (Patient taking differently: Take 50 mg by mouth every 6 (six) hours as needed. ) 15 tablet 0 Completed Course at Unknown time  . VIAGRA 50 MG tablet Take 50 mg by mouth daily as needed for erectile dysfunction.  3 Past Month at Unknown time  . vitamin B-12 (CYANOCOBALAMIN) 1000 MCG tablet Take 100 mcg by mouth daily.   Past Week at Unknown time    Patient Stressors: Financial difficulties  Patient Strengths: Capable of independent living  Treatment Modalities: Medication Management, Group therapy, Case management,  1 to 1 session with  clinician, Psychoeducation, Recreational therapy.   Physician Treatment Plan for Primary Diagnosis: Severe episode of recurrent major depressive disorder, without psychotic features (HCC) Long Term Goal(s): Improvement in  symptoms so as ready for discharge Improvement in symptoms so as ready for discharge   Short Term Goals: Ability to verbalize feelings will improve Ability to identify and develop effective coping behaviors will improve Ability to maintain clinical measurements within normal limits will improve Ability to verbalize feelings will improve Ability to identify and develop effective coping behaviors will improve  Medication Management: Evaluate patient's response, side effects, and tolerance of medication regimen.  Therapeutic Interventions: 1 to 1 sessions, Unit Group sessions and Medication administration.  Evaluation of Outcomes: Not Progressing  Physician Treatment Plan for Secondary Diagnosis: Principal Problem:   Severe episode of recurrent major depressive disorder, without psychotic features (HCC) Active Problems:   Insomnia due to mental disorder   Diabetes (HCC)   HTN (hypertension)   GERD (gastroesophageal reflux disease)   Vitamin B12 deficiency  Long Term Goal(s): Improvement in symptoms so as ready for discharge Improvement in symptoms so as ready for discharge   Short Term Goals: Ability to verbalize feelings will improve Ability to identify and develop effective coping behaviors will improve Ability to maintain clinical measurements within normal limits will improve Ability to verbalize feelings will improve Ability to identify and develop effective coping behaviors will improve     Medication Management: Evaluate patient's response, side effects, and tolerance of medication regimen.  Therapeutic Interventions: 1 to 1 sessions, Unit Group sessions and Medication administration.  Evaluation of Outcomes: Not Progressing   RN Treatment Plan for  Primary Diagnosis: Severe episode of recurrent major depressive disorder, without psychotic features (HCC) Long Term Goal(s): Knowledge of disease and therapeutic regimen to maintain health will improve  Short Term Goals: Ability to verbalize frustration and anger appropriately will improve, Ability to identify and develop effective coping behaviors will improve and Compliance with prescribed medications will improve  Medication Management: RN will administer medications as ordered by provider, will assess and evaluate patient's response and provide education to patient for prescribed medication. RN will report any adverse and/or side effects to prescribing provider.  Therapeutic Interventions: 1 on 1 counseling sessions, Psychoeducation, Medication administration, Evaluate responses to treatment, Monitor vital signs and CBGs as ordered, Perform/monitor CIWA, COWS, AIMS and Fall Risk screenings as ordered, Perform wound care treatments as ordered.  Evaluation of Outcomes: Progressing   LCSW Treatment Plan for Primary Diagnosis: Severe episode of recurrent major depressive disorder, without psychotic features (HCC) Long Term Goal(s): Safe transition to appropriate next level of care at discharge, Engage patient in therapeutic group addressing interpersonal concerns.  Short Term Goals: Engage patient in aftercare planning with referrals and resources, Increase emotional regulation, Identify triggers associated with mental health/substance abuse issues and Increase skills for wellness and recovery  Therapeutic Interventions: Assess for all discharge needs, 1 to 1 time with Social worker, Explore available resources and support systems, Assess for adequacy in community support network, Educate family and significant other(s) on suicide prevention, Complete Psychosocial Assessment, Interpersonal group therapy.  Evaluation of Outcomes: Progressing   Progress in Treatment: Attending groups:  Yes. Participating in groups: Yes. Taking medication as prescribed: Yes. Toleration medication: Yes. Family/Significant other contact made: Yes, individual(s) contacted:  wife Patient understands diagnosis: Yes. Discussing patient identified problems/goals with staff: Yes. Medical problems stabilized or resolved: Yes. Denies suicidal/homicidal ideation: Yes. Issues/concerns per patient self-inventory: No. Other:    New problem(s) identified: No, Describe:     New Short Term/Long Term Goal(s):  Discharge Plan or Barriers: CSW assessing for appropriate plan.  Reason for Continuation of Hospitalization: Anxiety Depression Medication stabilization ,  ECT treatments Coordination of After care planning  Estimated Length of Stay:7 days  Attendees: Patient: 02/26/2017   Physician: Dr. Jennet Maduro, MD 02/26/2017   Nursing: Hulan Amato, RN 02/26/2017   RN Care Manager: 02/26/2017   Social Worker: Daleen Squibb, LCSW 02/26/2017   Recreational Therapist: Garret Reddish, CTRS, LRT 02/26/2017   Other:  02/26/2017   Other:  02/26/2017   Other: 02/26/2017        Scribe for Treatment Team: [name], LCSW 02/26/2017 2:23 PM

## 2017-02-26 NOTE — Plan of Care (Signed)
Problem: Activity: Goal: Interest or engagement in activities will improve Outcome: Progressing Attending groups, more visible in the milieu.

## 2017-02-26 NOTE — [facility] Group Notes (Signed)
LCSW Group Therapy Note  02/26/2017 9:30am  Type of Therapy and Topic:  Group Therapy:  Feelings around Relapse and Recovery  Participation Level:  Minimal   Description of Group:    Patients in this group will discuss emotions they experience before and after a relapse. They will process how experiencing these feelings, or avoidance of experiencing them, relates to having a relapse. Facilitator will guide patients to explore emotions they have related to recovery. Patients will be encouraged to process which emotions are more powerful. They will be guided to discuss the emotional reaction significant others in their lives may have to their relapse or recovery. Patients will be assisted in exploring ways to respond to the emotions of others without this contributing to a relapse.  Therapeutic Goals: 1. Patient will identify two or more emotions that lead to a relapse for them 2. Patient will identify two emotions that result when they relapse 3. Patient will identify two emotions related to recovery 4. Patient will demonstrate ability to communicate their needs through discussion and/or role plays   Summary of Patient Progress: Pt able to meet therapeutic goals listed above.  Making better eye contact and participating more in group.    Therapeutic Modalities:   Cognitive Behavioral Therapy Solution-Focused Therapy Assertiveness Training Relapse Prevention Therapy   Glennon Mac, LCSW 02/26/2017 11:24 AM

## 2017-02-27 MED ORDER — INFLUENZA VAC SPLIT QUAD 0.5 ML IM SUSY
0.5000 mL | PREFILLED_SYRINGE | INTRAMUSCULAR | Status: AC
Start: 2017-02-28 — End: 2017-02-27
  Administered 2017-02-27: 0.5 mL via INTRAMUSCULAR
  Filled 2017-02-27: qty 0.5

## 2017-02-27 MED ORDER — CALAMINE EX LOTN
TOPICAL_LOTION | CUTANEOUS | Status: DC | PRN
  Administered 2017-02-27: 22:00:00 via TOPICAL
  Filled 2017-02-27 (×2): qty 177

## 2017-02-27 NOTE — Progress Notes (Signed)
Medication and group compliant.  Denies SI/HI/AVH.  Support and encouragement offered.  Safety maintatined.

## 2017-02-27 NOTE — [facility] Group Notes (Signed)
[facility] Group Notes:  (Nursing/MHT/Case Management/Adjunct)  Date:  02/27/2017  Time:  9:56 PM  Type of Therapy:  Group Therapy  Participation Level:  Did Not Attend  Participation Quality:   Mayra Neer 02/27/2017, 9:56 PM

## 2017-02-27 NOTE — Plan of Care (Signed)
Problem: Safety: Goal: Ability to disclose and discuss suicidal ideas will improve Outcome: Progressing Denies SI/HI/AVH.

## 2017-02-27 NOTE — [facility] Group Notes (Signed)
LCSW Group Therapy Note   02/27/2017 1:15pm   Type of Therapy and Topic:  Group Therapy:  Trust and Honesty  Participation Level:  Minimal  Description of Group:    In this group patients will be asked to explore the value of being honest.  Patients will be guided to discuss their thoughts, feelings, and behaviors related to honesty and trusting in others. Patients will process together how trust and honesty relate to forming relationships with peers, family members, and self. Each patient will be challenged to identify and express feelings of being vulnerable. Patients will discuss reasons why people are dishonest and identify alternative outcomes if one was truthful (to self or others). This group will be process-oriented, with patients participating in exploration of their own experiences, giving and receiving support, and processing challenge from other group members.   Therapeutic Goals: 1. Patient will identify why honesty is important to relationships and how honesty overall affects relationships.  2. Patient will identify a situation where they lied or were lied too and the  feelings, thought process, and behaviors surrounding the situation 3. Patient will identify the meaning of being vulnerable, how that feels, and how that correlates to being honest with self and others. 4. Patient will identify situations where they could have told the truth, but instead lied and explain reasons of dishonesty.   Summary of Patient Progress: Pt made several comments when asked a question by CSW but continues to be mostly quiet during group.  He is attentive but does not contribute much to group discussion.    Therapeutic Modalities:   Cognitive Behavioral Therapy Solution Focused Therapy Motivational Interviewing Brief Therapy  [name], LCSW 02/27/2017 3:28 PM

## 2017-02-27 NOTE — Progress Notes (Signed)
[facility] MD Progress Note  02/27/2017 3:14 PM Jeffrey Velazquez  MRN:  [id number]  Subjective:  Jeffrey Velazquez is a 56 year old male with a history of depression admitted for worsening of his symptoms and suicidal ideation with a plan in the context of severe social stressors.  02/26/17 Today the patient appears to feel slightly better. He reported some improvement with Dr. Toni Amend yesterday during ECT consultation. He is out of his room today slowly walking the hallways, smiling a little. He did go to group. He still spends a lot of time in his room but overall feels much more optimistic. He will consider ECT if necessary. He is no longer suicidal. He reports that his sleep and appetite. He believes that he is gaining weight in the hospital after he had lost 60 pounds. He is still preoccupied with shingles and swelling of his intestines and can talk about 4 hours. I wonder if he started diagnosis is depression with psychotic features with somatic delusions.  Treatment plan. I will discontinue Cymbalta and Remeron and start Effexor 300 mg daily for depression. I will increase Zyprexa to 20 mg at bedtime for psychosis. If necessary the patient could start ECT on Wednesday.  02/27/17 - patient endorses that he had a really great day yesterday, was able to get some exercise. Went outside and did some muscle stretches. Endorses that he is still a little little depressed. Continues to think about his house which is in foreclosure and by the end of the month he and his wife will be homeless. Plans to go to a friend's house. He did of time. Lost his job because of his pain and shingles and his depressive symptoms Feels that the Effexor is doing okay for now and although ECT has been considered he feels it may not be needed.  Social/disposition. The patient will be discharge with his wife. He will follow up with [facility].  Past psychiatric history. There is a history of mild depression treated with Cymbalta.    Family psychiatric history. Nonreported.  No other new information was available today.  Principal Problem: Severe episode of recurrent major depressive disorder, without psychotic features (HCC) Diagnosis:   Patient Active Problem List   Diagnosis Date Noted  . Diabetes (HCC) [E11.9] 02/21/2017  . HTN (hypertension) [I10] 02/21/2017  . GERD (gastroesophageal reflux disease) [K21.9] 02/21/2017  . Vitamin B12 deficiency [E53.8] 02/21/2017  . Severe episode of recurrent major depressive disorder, without psychotic features (HCC) [F33.2] 02/20/2017  . 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 Chest pain [R07.9] 10/29/2016  . Sinus tachycardia [R00.0] 10/29/2016  . Hyponatremia [E87.1] 10/29/2016  . Hypokalemia [E87.6] 10/29/2016   Total Time spent with patient: 30 minutes  Past Medical History:  Past Medical History:  Diagnosis Date  . Anemia   . Anxiety   . Depression   . Diabetes mellitus without complication (HCC)   . DOE (dyspnea on exertion)   . Early satiety   . Epigastric abdominal pain   . GERD (gastroesophageal reflux disease)   . Hyperlipidemia   . Irritable bowel syndrome   . Shingles   . Tachycardia   . Unintended weight loss     Past Surgical History:  Procedure Laterality Date  . COLONOSCOPY WITH PROPOFOL N/A 11/19/2016   Procedure: COLONOSCOPY WITH PROPOFOL;  Surgeon: Scot Jun, MD;  Location: [facility] ENDOSCOPY;  Service: Endoscopy;  Laterality: N/A;  . ESOPHAGOGASTRODUODENOSCOPY (EGD) WITH PROPOFOL N/A 11/06/2016   Procedure: ESOPHAGOGASTRODUODENOSCOPY (EGD) WITH PROPOFOL;  Surgeon:  Scot Jun, MD;  Location: [facility] ENDOSCOPY;  Service: Endoscopy;  Laterality: N/A;  . FRACTURE SURGERY Left 2010   Clavicle  . TONSILLECTOMY    . TOOTH EXTRACTION     wisdom teeth x 4   Family History:  Family History  Problem Relation Age of Onset  . Diabetes Mother   . Heart disease Mother   . Cancer Mother   . CAD Mother   . Heart disease Father   .  CAD Father   . Diabetes Sister   . Emphysema Maternal Grandfather   . Diabetes Paternal Grandmother   . Cancer Paternal Grandfather     Social History:  History  Alcohol Use No     History  Drug Use No    Social History   Social History  . Marital status: Married    Spouse name: N/A  . Number of children: N/A  . Years of education: N/A   Social History Main Topics  . Smoking status: Never Smoker  . Smokeless tobacco: Never Used  . Alcohol use No  . Drug use: No  . Sexual activity: Yes   Other Topics Concern  . None   Social History Narrative  . None   Additional Social History:    Pain Medications: See PTA Prescriptions: See PTA Over the Counter: See PTA History of alcohol / drug use?: No history of alcohol / drug abuse                    Sleep: Fair  Appetite:  Fair  Current Medications: Current Facility-Administered Medications  Medication Dose Route Frequency Provider Last Rate Last Dose  . acetaminophen (TYLENOL) tablet 650 mg  650 mg Oral Q6H PRN Darliss Ridgel, MD      . alum & mag hydroxide-simeth (MAALOX/MYLANTA) 200-200-20 MG/5ML suspension 30 mL  30 mL Oral Q4H PRN Darliss Ridgel, MD      . aspirin EC tablet 81 mg  81 mg Oral Daily Darliss Ridgel, MD   81 mg at 02/27/17 1610  . atorvastatin (LIPITOR) tablet 20 mg  20 mg Oral Daily Darliss Ridgel, MD   20 mg at 02/27/17 9604  . carbamazepine (TEGRETOL) tablet 200 mg  200 mg Oral BID Darliss Ridgel, MD   200 mg at 02/27/17 0840  . cholecalciferol (VITAMIN D) tablet 5,000 Units  5,000 Units Oral Daily Darliss Ridgel, MD   5,000 Units at 02/27/17 0840  . feeding supplement (GLUCERNA SHAKE) (GLUCERNA SHAKE) liquid 237 mL  237 mL Oral BID BM Darliss Ridgel, MD   237 mL at 02/26/17 1000  . folic acid (FOLVITE) tablet 0.5 mg  0.5 mg Oral Daily Darliss Ridgel, MD   0.5 mg at 02/27/17 0840  . gabapentin (NEURONTIN) capsule 600 mg  600 mg Oral TID Darliss Ridgel, MD   600 mg at 02/27/17 0840  .  glimepiride (AMARYL) tablet 2 mg  2 mg Oral BID Darliss Ridgel, MD   2 mg at 02/27/17 0840  . [START ON 02/28/2017] Influenza vac split quadrivalent PF (FLUARIX) injection 0.5 mL  0.5 mL Intramuscular Tomorrow-1000 Darliss Ridgel, MD      . lidocaine (LIDODERM) 5 % 1 patch  1 patch Transdermal Q24H Darliss Ridgel, MD   1 patch at 02/27/17 0841  . magnesium hydroxide (MILK OF MAGNESIA) suspension 30 mL  30 mL Oral Daily PRN Darliss Ridgel, MD      .  metFORMIN (GLUCOPHAGE-XR) 24 hr tablet 500 mg  500 mg Oral BID Darliss Ridgel, MD   500 mg at 02/27/17 0841  . metoprolol tartrate (LOPRESSOR) tablet 12.5 mg  12.5 mg Oral BID Darliss Ridgel, MD   12.5 mg at 02/27/17 0839  . multivitamin with minerals tablet 1 tablet  1 tablet Oral Daily Darliss Ridgel, MD   1 tablet at 02/27/17 0840  . OLANZapine (ZYPREXA) tablet 20 mg  20 mg Oral QHS Pucilowska, Jolanta B, MD   20 mg at 02/26/17 2130  . omega-3 acid ethyl esters (LOVAZA) capsule 2 g  2 g Oral Daily Darliss Ridgel, MD   2 g at 02/27/17 0840  . pantoprazole (PROTONIX) EC tablet 40 mg  40 mg Oral Daily Darliss Ridgel, MD   40 mg at 02/27/17 0841  . protein supplement (PREMIER PROTEIN) liquid  11 oz Oral Q24H Darliss Ridgel, MD   11 oz at 02/26/17 2130  . sucralfate (CARAFATE) tablet 1 g  1 g Oral TID WC & HS Darliss Ridgel, MD   1 g at 02/27/17 0840  . venlafaxine XR (EFFEXOR-XR) 24 hr capsule 300 mg  300 mg Oral Q breakfast Pucilowska, Jolanta B, MD   300 mg at 02/27/17 0840    Lab Results:  No results found for this or any previous visit (from the past 48 hour(s)).  Blood Alcohol level:  No results found for: [facility]  Metabolic Disorder Labs: Lab Results  Component Value Date   HGBA1C 6.5 (H) 02/20/2017   MPG 139.85 02/20/2017   MPG 148 10/29/2016   No results found for: PROLACTIN Lab Results  Component Value Date   CHOL 158 02/20/2017   TRIG 215 (H) 02/20/2017   HDL 45 02/20/2017   CHOLHDL 3.5 02/20/2017   VLDL 43 (H) 02/20/2017   LDLCALC  70 02/20/2017    Physical Findings: AIMS:  , ,  ,  ,    CIWA:  CIWA-Ar Total: 2 COWS:  COWS Total Score: 2  Musculoskeletal: Strength & Muscle Tone: within normal limits Gait & Station: normal Patient leans: N/A  Psychiatric Specialty Exam: Physical Exam  Nursing note and vitals reviewed. Psychiatric: Judgment and thought content normal. His affect is blunt. His speech is delayed. He is slowed and withdrawn. Cognition and memory are impaired.    Review of Systems  Constitutional: Positive for malaise/fatigue and weight loss.  Neurological: Negative.   Psychiatric/Behavioral: Positive for depression and suicidal ideas. The patient is nervous/anxious.   All other systems reviewed and are negative.   Blood pressure 111/76, pulse (!) 101, temperature 97.8 F (36.6 C), temperature source Oral, resp. rate 18, height  (1.727 m), weight 138 lb (62.6 kg), SpO2 100 %.Body mass index is 20.98 kg/m.  General Appearance: Fairly Groomed  Eye Contact:  Minimal  Speech:  Slow  Volume:  Decreased  Mood:  Much better spirits  Affect:  reactive  Thought Process:  Goal Directed and Descriptions of Associations: Intact  Orientation:  Full (Time, Place, and Person)  Thought Content:  WDL  Suicidal Thoughts:  Yes.  without intent/plan  Homicidal Thoughts:  No  Memory:  Immediate;   Fair Recent;   Fair Remote;   Fair  Judgement:  Fair  Insight:  Shallow  Psychomotor Activity:  Psychomotor Retardation  Concentration:  Concentration: Fair and Attention Span: Fair  Recall:  [employer] of Knowledge:  Fair  Language:  Fair  Akathisia:  No  Handed:  Right  AIMS (if indicated):     Assets:  Communication Skills Desire for Improvement Financial Resources/Insurance Housing Intimacy Resilience Social Support  ADL's:  Intact  Cognition:  WNL  Sleep:  Number of Hours: 6.15     Treatment Plan Summary: Daily contact with patient to assess and evaluate symptoms and progress in  treatment and Medication management   Mr. Mazer a 56 year old male with new onset depression admitted for worsening of his symptoms in the face of severe psychosocial stressors. Although he presented with the whole syndrome of depressive symptoms, he has started improving on just a medication regimen. Although ECT was discussed as an option, he would not need it in all likelihood. Denies any suicidal thoughts or thoughts about dying now. Although he has a lot of psychosocial stresses including loss of job and his house, he wants to work towards making it all right.  1. Mood. Discontinue Cymbalta and Remeron and continue Effexor 300 mg daily for depression. Zyprexa increased to 20 mg at bedtime.   2. Neuralgia. The patient has been maintained on Tegretol 200 mg twice daily.  3. Diabetes. The patient is on metformin and Glimepiride, and ADA diet.  4. Hypertension. He is on metoprolol  5. Dyslipidemia. Continue Lipitor.  6. GERD. He is on omeprazole.   7. Weight loss. We offered Glucerna.   8. Chronic pain. He is on tramadol as needed.   9. Hypertension. We continue metoprolol.  10. Vitamin B12 deficiency. We will continue supplementation. Vitamin B12 level is low will give injections for 3 days.  11. Metabolic syndrome monitoring.  Lipid panel shows elevated triglycerides, normal TSH and hemoglobin A1c.  12. EKG. Has not been done in spite of multiple requests.  13.  Dementia workup. RPR and HIV testing are negative.   14. Head CT scan is unremarkable.    15. Disposition. The patient will be discharged with his wife. He will follow up with [facility].   Aundria Rud, MD 02/27/2017, 3:14 PM

## 2017-02-28 NOTE — [facility] Group Notes (Signed)
LCSW Group Therapy Note   02/28/2017 1:15pm   Type of Therapy and Topic:  Group Therapy:  Positive Affirmations   Participation Level:  Active  Description of Group: This group addressed positive affirmation toward self and others. Patients went around the room and identified two positive things about themselves and two positive things about a peer in the room. Patients reflected on how it felt to share something positive with others, to identify positive things about themselves, and to hear positive things from others. Patients were encouraged to have a daily reflection of positive characteristics or circumstances.  Therapeutic Goals 1. Patient will verbalize two of their positive qualities 2. Patient will demonstrate empathy for others by stating two positive qualities about a peer in the group 3. Patient will verbalize their feelings when voicing positive self affirmations and when voicing positive affirmations of others 4. Patients will discuss the potential positive impact on their wellness/recovery of focusing on positive traits of self and others. Summary of Patient Progress: Stayed the entire time, engaged throughout.  Talked about the deaths of both parents and a close friend, all in 2008.  Then his siblings cleared out all the belongings.  He is not judgemental nor vindictive.  "Life is too short from all that."  Sates he got his forgiving attitude from his father, and cites faith and spouse as his Hydrologist Therapy Motivational Interviewing  Ida Rogue, [place] 02/28/2017 2:54 PM

## 2017-02-28 NOTE — Progress Notes (Signed)
Patient is up for meals and meds. He is pleasant and cooperative. He denies si, hi, avh. He states some improvement in his mood. He had a visit with his wife that went well. No behavior issues noted.

## 2017-02-28 NOTE — Plan of Care (Signed)
Problem: Activity: Goal: Interest or engagement in activities will improve Outcome: Progressing Patient encouraged to attend groups Goal: Sleeping patterns will improve Outcome: Progressing Patient states he is sleeping well  Problem: Education: Goal: Emotional status will improve Outcome: Progressing Patient states his mood is better

## 2017-02-28 NOTE — Progress Notes (Signed)
[facility] MD Progress Note  02/28/2017 2:45 PM Lovie Agresta  MRN:  [id number]  Subjective:  Mr. Burgert is a 56 year old male with a history of depression admitted for worsening of his symptoms and suicidal ideation with a plan in the context of severe social stressors.  02/26/17 Today the patient appears to feel slightly better. He reported some improvement with Dr. Toni Amend yesterday during ECT consultation. He is out of his room today slowly walking the hallways, smiling a little. He did go to group. He still spends a lot of time in his room but overall feels much more optimistic. He will consider ECT if necessary. He is no longer suicidal. He reports that his sleep and appetite. He believes that he is gaining weight in the hospital after he had lost 60 pounds. He is still preoccupied with shingles and swelling of his intestines and can talk about 4 hours. I wonder if he started diagnosis is depression with psychotic features with somatic delusions.  Treatment plan. I will discontinue Cymbalta and Remeron and start Effexor 300 mg daily for depression. I will increase Zyprexa to 20 mg at bedtime for psychosis. If necessary the patient could start ECT on Wednesday.  02/27/17 - patient endorses that he had a really great day yesterday, was able to get some exercise. Went outside and did some muscle stretches. Endorses that he is still a little little depressed. Continues to think about his house which is in foreclosure and by the end of the month he and his wife will be homeless. Plans to go to a friend's house. He did of time. Lost his job because of his pain and shingles and his depressive symptoms Feels that the Effexor is doing okay for now and although ECT has been considered he feels it may not be needed.  02/28/17 - patient continues to be doing okay. Endorses that he has itchy spots over both his elbows and advised him to apply calamine lotion for this. Denies any suicidal ideation or thoughts  about dying. Is able to take perspective of his psychosocial stressors.  Social/disposition. The patient will be discharge with his wife. He will follow up with [facility].  Past psychiatric history. There is a history of mild depression treated with Cymbalta.   Family psychiatric history. Nonreported.  No other new information was available today.  Principal Problem: Severe episode of recurrent major depressive disorder, without psychotic features (HCC) Diagnosis:   Patient Active Problem List   Diagnosis Date Noted  . Diabetes (HCC) [E11.9] 02/21/2017  . HTN (hypertension) [I10] 02/21/2017  . GERD (gastroesophageal reflux disease) [K21.9] 02/21/2017  . Vitamin B12 deficiency [E53.8] 02/21/2017  . Severe episode of recurrent major depressive disorder, without psychotic features (HCC) [F33.2] 02/20/2017  . 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 Chest pain [R07.9] 10/29/2016  . Sinus tachycardia [R00.0] 10/29/2016  . Hyponatremia [E87.1] 10/29/2016  . Hypokalemia [E87.6] 10/29/2016   Total Time spent with patient: 30 minutes  Past Medical History:  Past Medical History:  Diagnosis Date  . Anemia   . Anxiety   . Depression   . Diabetes mellitus without complication (HCC)   . DOE (dyspnea on exertion)   . Early satiety   . Epigastric abdominal pain   . GERD (gastroesophageal reflux disease)   . Hyperlipidemia   . Irritable bowel syndrome   . Shingles   . Tachycardia   . Unintended weight loss     Past Surgical History:  Procedure Laterality Date  .  COLONOSCOPY WITH PROPOFOL N/A 11/19/2016   Procedure: COLONOSCOPY WITH PROPOFOL;  Surgeon: Scot Jun, MD;  Location: [facility] ENDOSCOPY;  Service: Endoscopy;  Laterality: N/A;  . ESOPHAGOGASTRODUODENOSCOPY (EGD) WITH PROPOFOL N/A 11/06/2016   Procedure: ESOPHAGOGASTRODUODENOSCOPY (EGD) WITH PROPOFOL;  Surgeon: Scot Jun, MD;  Location: [facility] ENDOSCOPY;  Service: Endoscopy;  Laterality: N/A;  . FRACTURE SURGERY Left  2010   Clavicle  . TONSILLECTOMY    . TOOTH EXTRACTION     wisdom teeth x 4   Family History:  Family History  Problem Relation Age of Onset  . Diabetes Mother   . Heart disease Mother   . Cancer Mother   . CAD Mother   . Heart disease Father   . CAD Father   . Diabetes Sister   . Emphysema Maternal Grandfather   . Diabetes Paternal Grandmother   . Cancer Paternal Grandfather     Social History:  History  Alcohol Use No     History  Drug Use No    Social History   Social History  . Marital status: Married    Spouse name: N/A  . Number of children: N/A  . Years of education: N/A   Social History Main Topics  . Smoking status: Never Smoker  . Smokeless tobacco: Never Used  . Alcohol use No  . Drug use: No  . Sexual activity: Yes   Other Topics Concern  . None   Social History Narrative  . None   Additional Social History:    Pain Medications: See PTA Prescriptions: See PTA Over the Counter: See PTA History of alcohol / drug use?: No history of alcohol / drug abuse                    Sleep: Fair  Appetite:  Fair  Current Medications: Current Facility-Administered Medications  Medication Dose Route Frequency Provider Last Rate Last Dose  . acetaminophen (TYLENOL) tablet 650 mg  650 mg Oral Q6H PRN Darliss Ridgel, MD      . alum & mag hydroxide-simeth (MAALOX/MYLANTA) 200-200-20 MG/5ML suspension 30 mL  30 mL Oral Q4H PRN Darliss Ridgel, MD      . aspirin EC tablet 81 mg  81 mg Oral Daily Darliss Ridgel, MD   81 mg at 02/28/17 0834  . atorvastatin (LIPITOR) tablet 20 mg  20 mg Oral Daily Darliss Ridgel, MD   20 mg at 02/28/17 0834  . calamine lotion   Topical PRN Aundria Rud, MD      . carbamazepine (TEGRETOL) tablet 200 mg  200 mg Oral BID Darliss Ridgel, MD   200 mg at 02/28/17 0834  . cholecalciferol (VITAMIN D) tablet 5,000 Units  5,000 Units Oral Daily Darliss Ridgel, MD   5,000 Units at 02/28/17 [id number]  . feeding supplement  (GLUCERNA SHAKE) (GLUCERNA SHAKE) liquid 237 mL  237 mL Oral BID BM Darliss Ridgel, MD   237 mL at 02/28/17 1352  . folic acid (FOLVITE) tablet 0.5 mg  0.5 mg Oral Daily Darliss Ridgel, MD   0.5 mg at 02/28/17 0834  . gabapentin (NEURONTIN) capsule 600 mg  600 mg Oral TID Darliss Ridgel, MD   600 mg at 02/28/17 1202  . glimepiride (AMARYL) tablet 2 mg  2 mg Oral BID Darliss Ridgel, MD   2 mg at 02/28/17 0835  . lidocaine (LIDODERM) 5 % 1 patch  1 patch Transdermal Q24H Darliss Ridgel, MD  1 patch at 02/28/17 0835  . magnesium hydroxide (MILK OF MAGNESIA) suspension 30 mL  30 mL Oral Daily PRN Darliss Ridgel, MD      . metFORMIN (GLUCOPHAGE-XR) 24 hr tablet 500 mg  500 mg Oral BID Darliss Ridgel, MD   500 mg at 02/28/17 0836  . metoprolol tartrate (LOPRESSOR) tablet 12.5 mg  12.5 mg Oral BID Darliss Ridgel, MD   12.5 mg at 02/28/17 0834  . multivitamin with minerals tablet 1 tablet  1 tablet Oral Daily Darliss Ridgel, MD   1 tablet at 02/28/17 0834  . OLANZapine (ZYPREXA) tablet 20 mg  20 mg Oral QHS Pucilowska, Jolanta B, MD   20 mg at 02/27/17 2127  . omega-3 acid ethyl esters (LOVAZA) capsule 2 g  2 g Oral Daily Darliss Ridgel, MD   2 g at 02/28/17 0836  . pantoprazole (PROTONIX) EC tablet 40 mg  40 mg Oral Daily Darliss Ridgel, MD   40 mg at 02/28/17 0834  . protein supplement (PREMIER PROTEIN) liquid  11 oz Oral Q24H Darliss Ridgel, MD   11 oz at 02/27/17 2128  . sucralfate (CARAFATE) tablet 1 g  1 g Oral TID WC & HS Darliss Ridgel, MD   1 g at 02/28/17 1202  . venlafaxine XR (EFFEXOR-XR) 24 hr capsule 300 mg  300 mg Oral Q breakfast Pucilowska, Jolanta B, MD   300 mg at 02/28/17 9147    Lab Results:  No results found for this or any previous visit (from the past 48 hour(s)).  Blood Alcohol level:  No results found for: [facility]  Metabolic Disorder Labs: Lab Results  Component Value Date   HGBA1C 6.5 (H) 02/20/2017   MPG 139.85 02/20/2017   MPG 148 10/29/2016   No results found for:  PROLACTIN Lab Results  Component Value Date   CHOL 158 02/20/2017   TRIG 215 (H) 02/20/2017   HDL 45 02/20/2017   CHOLHDL 3.5 02/20/2017   VLDL 43 (H) 02/20/2017   LDLCALC 70 02/20/2017    Physical Findings: AIMS:  , ,  ,  ,    CIWA:  CIWA-Ar Total: 2 COWS:  COWS Total Score: 2  Musculoskeletal: Strength & Muscle Tone: within normal limits Gait & Station: normal Patient leans: N/A  Psychiatric Specialty Exam: Physical Exam  Nursing note and vitals reviewed. Psychiatric: Judgment and thought content normal. His affect is blunt. His speech is delayed. He is slowed and withdrawn. Cognition and memory are impaired.    Review of Systems  Constitutional: Positive for malaise/fatigue and weight loss.  Neurological: Negative.   Psychiatric/Behavioral: Positive for depression and suicidal ideas. The patient is nervous/anxious.   All other systems reviewed and are negative.   Blood pressure 113/69, pulse 91, temperature 98 F (36.7 C), temperature source Oral, resp. rate 18, height  (1.727 m), weight 138 lb (62.6 kg), SpO2 100 %.Body mass index is 20.98 kg/m.  General Appearance: Fairly Groomed  Eye Contact:  Minimal  Speech:  Slow  Volume:  Decreased  Mood:  Much better spirits  Affect:  reactive  Thought Process:  Goal Directed and Descriptions of Associations: Intact  Orientation:  Full (Time, Place, and Person)  Thought Content:  WDL  Suicidal Thoughts:  Yes.  without intent/plan  Homicidal Thoughts:  No  Memory:  Immediate;   Fair Recent;   Fair Remote;   Fair  Judgement:  Fair  Insight:  Shallow  Psychomotor Activity:  Psychomotor  Retardation  Concentration:  Concentration: Fair and Attention Span: Fair  Recall:  [employer] of Knowledge:  Fair  Language:  Fair  Akathisia:  No  Handed:  Right  AIMS (if indicated):     Assets:  Communication Skills Desire for Improvement Financial Resources/Insurance Housing Intimacy Resilience Social Support  ADL's:   Intact  Cognition:  WNL  Sleep:  Number of Hours: 8     Treatment Plan Summary: Daily contact with patient to assess and evaluate symptoms and progress in treatment and Medication management   Mr. Johnsey a 56 year old male with new onset depression admitted for worsening of his symptoms in the face of severe psychosocial stressors. Although he presented with the whole syndrome of depressive symptoms, he has started improving on just a medication regimen. Although ECT was discussed as an option, he would not need it in all likelihood. Denies any suicidal thoughts or thoughts about dying now. Although he has a lot of psychosocial stresses including loss of job and his house, he wants to work towards making it all right.  1. Mood. Discontinue Cymbalta and Remeron and continue Effexor 300 mg daily for depression. Zyprexa increased to 20 mg at bedtime.   2. Neuralgia. The patient has been maintained on Tegretol 200 mg twice daily.  3. Diabetes. The patient is on metformin and Glimepiride, and ADA diet.  4. Hypertension. He is on metoprolol  5. Dyslipidemia. Continue Lipitor.  6. GERD. He is on omeprazole.   7. Weight loss. We offered Glucerna.   8. Chronic pain. He is on tramadol as needed.   9. Hypertension. We continue metoprolol.  10. Vitamin B12 deficiency. We will continue supplementation. Vitamin B12 level is low will give injections for 3 days.  11. Metabolic syndrome monitoring.  Lipid panel shows elevated triglycerides, normal TSH and hemoglobin A1c.  12. EKG. Has not been done in spite of multiple requests.  13.  Dementia workup. RPR and HIV testing are negative.   14. Head CT scan is unremarkable.    15. Disposition. The patient will be discharged with his wife. He will follow up with [facility].   Aundria Rud, MD 02/28/2017, 2:45 PM

## 2017-02-28 NOTE — Plan of Care (Signed)
Problem: Education: Goal: Emotional status will improve Outcome: Progressing Patient demonstrates a spontaneous affect and engages with Clinical research associate about medication education.  Problem: Safety: Goal: Periods of time without injury will increase Outcome: Progressing Patient observed walking with a steady gait.  He attended wrap up group and had snack with peers.  He denies SI/HI/AV/H and contracts for safety.

## 2017-02-28 NOTE — [facility] Group Notes (Signed)
[facility] Group Notes:  (Nursing/MHT/Case Management/Adjunct)  Date:  02/28/2017  Time:  11:25 PM  Type of Therapy:  Psychoeducational Skills  Participation Level:  Active  Participation Quality:  Appropriate and Attentive  Affect:  Appropriate  Cognitive:  Appropriate  Insight:  Appropriate and Good  Engagement in Group:  Engaged  Modes of Intervention:  Discussion, Socialization and Support  Summary of Progress/Problems:  Chancy Milroy 02/28/2017, 11:25 PM

## 2017-03-01 MED ORDER — VENLAFAXINE HCL ER 150 MG PO CP24: 300.0000 mg | ORAL_CAPSULE | Freq: Every day | ORAL | 1 refills | Status: DC

## 2017-03-01 MED ORDER — FOLIC ACID 400 MCG PO TABS: 400.0000 ug | ORAL_TABLET | Freq: Every day | ORAL | 1 refills | Status: DC

## 2017-03-01 MED ORDER — CARBAMAZEPINE 200 MG PO TABS: 200.0000 mg | ORAL_TABLET | Freq: Two times a day (BID) | ORAL | 3 refills | Status: DC

## 2017-03-01 MED ORDER — CYANOCOBALAMIN 1000 MCG PO TABS: 1000.0000 ug | ORAL_TABLET | Freq: Every day | ORAL | 1 refills | Status: DC

## 2017-03-01 MED ORDER — METFORMIN HCL ER 500 MG PO TB24: 500.0000 mg | ORAL_TABLET | Freq: Two times a day (BID) | ORAL | 1 refills | Status: DC

## 2017-03-01 MED ORDER — OLANZAPINE 20 MG PO TABS: 20.0000 mg | ORAL_TABLET | Freq: Every day | ORAL | 1 refills | Status: DC

## 2017-03-01 MED ORDER — LIDOCAINE 5 % EX PTCH: 1.0000 | MEDICATED_PATCH | CUTANEOUS | 1 refills | Status: DC

## 2017-03-01 MED ORDER — ATORVASTATIN CALCIUM 20 MG PO TABS: 20.0000 mg | ORAL_TABLET | Freq: Every day | ORAL | 1 refills | Status: DC

## 2017-03-01 MED ORDER — OMEGA-3-ACID ETHYL ESTERS 1 G PO CAPS: 2.0000 g | ORAL_CAPSULE | Freq: Every day | ORAL | 1 refills | Status: DC

## 2017-03-01 MED ORDER — GABAPENTIN 300 MG PO CAPS: 600.0000 mg | ORAL_CAPSULE | Freq: Three times a day (TID) | ORAL | 1 refills | Status: DC

## 2017-03-01 MED ORDER — METOPROLOL TARTRATE 25 MG PO TABS: 12.5000 mg | ORAL_TABLET | Freq: Two times a day (BID) | ORAL | 1 refills | Status: DC

## 2017-03-01 MED ORDER — VITAMIN D 1000 UNITS PO TABS: 5000.0000 [IU] | ORAL_TABLET | Freq: Every day | ORAL | 1 refills | Status: DC

## 2017-03-01 MED ORDER — SUCRALFATE 1 G PO TABS: 1.0000 g | ORAL_TABLET | Freq: Three times a day (TID) | ORAL | 1 refills | Status: DC

## 2017-03-01 MED ORDER — GLIMEPIRIDE 2 MG PO TABS: 2.0000 mg | ORAL_TABLET | Freq: Two times a day (BID) | ORAL | 1 refills | Status: DC

## 2017-03-01 MED ORDER — VITAMIN B-12 1000 MCG PO TABS
1000.0000 ug | ORAL_TABLET | Freq: Every day | ORAL | Status: DC
  Administered 2017-03-01 – 2017-03-02 (×2): 1000 ug via ORAL
  Filled 2017-03-01: qty 1

## 2017-03-01 MED ORDER — ASPIRIN EC 81 MG PO TBEC: 81.0000 mg | DELAYED_RELEASE_TABLET | Freq: Every day | ORAL | 1 refills | Status: DC

## 2017-03-01 MED ORDER — OMEPRAZOLE 20 MG PO CPDR: 40.0000 mg | DELAYED_RELEASE_CAPSULE | Freq: Every day | ORAL | 1 refills | Status: DC

## 2017-03-01 NOTE — Plan of Care (Signed)
Problem: Coping: Goal: Ability to verbalize frustrations and anger appropriately will improve Outcome: Progressing Patient is without hostility or anger this evening.  He is pleasant and friendly on approach. He attended group and had snack with peers.  Problem: Safety: Goal: Periods of time without injury will increase Outcome: Progressing Patient denies SI/HI/AV/H and contracts for safety.

## 2017-03-01 NOTE — [facility] Group Notes (Signed)
[facility] Group Notes:  (Nursing/MHT/Case Management/Adjunct)  Date:  03/01/2017  Time:  11:25 PM  Type of Therapy:  Group Therapy  Participation Level:  Active  Participation Quality:  Appropriate  Affect:  Appropriate  Cognitive:  Alert  Insight:  Appropriate  Engagement in Group:  Engaged  Modes of Intervention:  Support  Summary of Progress/Problems:  Mayra Neer 03/01/2017, 11:25 PM

## 2017-03-01 NOTE — [facility] Group Notes (Signed)
[facility] Group Notes:  (Nursing/MHT/Case Management/Adjunct)  Date:  03/01/2017  Time:  2:31 PM  Type of Therapy:  Psychoeducational Skills  Participation Level:  Active  Participation Quality:  Appropriate  Affect:  Appropriate  Cognitive:  Appropriate  Insight:  Appropriate  Engagement in Group:  Engaged  Modes of Intervention:  Discussion, Education, Socialization and Support   Summary of Progress/Problems:  Jeffrey Velazquez 03/01/2017, 2:31 PM

## 2017-03-01 NOTE — [facility] Group Notes (Signed)
LCSW Group Therapy Note   03/01/2017 9:30am   Type of Therapy and Topic:  Group Therapy:  Overcoming Obstacles   Participation Level:  Minimal   Description of Group:    In this group patients will be encouraged to explore what they see as obstacles to their own wellness and recovery. They will be guided to discuss their thoughts, feelings, and behaviors related to these obstacles. The group will process together ways to cope with barriers, with attention given to specific choices patients can make. Each patient will be challenged to identify changes they are motivated to make in order to overcome their obstacles. This group will be process-oriented, with patients participating in exploration of their own experiences as well as giving and receiving support and challenge from other group members.   Therapeutic Goals: 1. Patient will identify personal and current obstacles as they relate to admission. 2. Patient will identify barriers that currently interfere with their wellness or overcoming obstacles.  3. Patient will identify feelings, thought process and behaviors related to these barriers. 4. Patient will identify two changes they are willing to make to overcome these obstacles:      Summary of Patient Progress  Pt able to meet therapeutic goals listed above, Pt affect is brighter, occasional smiles when interacting with peers.     Therapeutic Modalities:   Cognitive Behavioral Therapy Solution Focused Therapy Motivational Interviewing Relapse Prevention Therapy  Glennon Mac, LCSW 03/01/2017 5:11 PM

## 2017-03-01 NOTE — Progress Notes (Addendum)
D: Visible in hallway on initial contact. A & O X4. Denies SI, HI, AVH and pain. States he's sleeping well with good appetite. Guarded with flat /sad affect but forwards during interactions. Per pt "I feel ok, maybe going home tomorrow, my wife is helping me with my disability paperwork". Gait is steady, observed ambulating in hall intermittently throughout this shift. Denies concerns at present.  A: Scheduled medications administered as prescribed with verbal education and effects monitored. Encouraged pt to voice concerns, comply with treatment regimen including unit groups. Routine safety checks maintained without self harm gestures.  R: Pt receptive to care. Remains medication compliant. Denies adverse drug reactions at this time. Attended groups on unit and was engaged minimally. POC continues for safety and mood stability. Remains safe on and off unit.

## 2017-03-01 NOTE — Progress Notes (Signed)
[facility] MD Progress Note  03/01/2017 5:24 PM Jeffrey Velazquez  MRN:  [id number]  Subjective:  Jeffrey Velazquez is a 56 year old male with a history of depression admitted for worsening of his symptoms and suicidal ideation with a plan in the context of severe social stressors.  02/26/17 Today the patient appears to feel slightly better. He reported some improvement with Dr. Toni Velazquez yesterday during ECT consultation. He is out of his room today slowly walking the hallways, smiling a little. He did go to group. He still spends a lot of time in his room but overall feels much more optimistic. He will consider ECT if necessary. He is no longer suicidal. He reports that his sleep and appetite. He believes that he is gaining weight in the hospital after he had lost 60 pounds. He is still preoccupied with shingles and swelling of his intestines and can talk about 4 hours. I wonder if he started diagnosis is depression with psychotic features with somatic delusions.  02/27/17 - patient endorses that he had a really great day yesterday, was able to get some exercise. Went outside and did some muscle stretches. Endorses that he is still a little little depressed. Continues to think about his house which is in foreclosure and by the end of the month he and his wife will be homeless. Plans to go to a friend's house. He did of time. Lost his job because of his pain and shingles and his depressive symptoms Feels that the Effexor is doing okay for now and although ECT has been considered he feels it may not be needed.  02/28/17 - patient continues to be doing okay. Endorses that he has itchy spots over both his elbows and advised him to apply calamine lotion for this. Denies any suicidal ideation or thoughts about dying. Is able to take perspective of his psychosocial stressors.  10/15. Jeffrey Velazquez reports much improvement. He denies any symptoms of depression, anxiety or psychosis. He is no longer suicidal or homicidal.  He tolerates medications well. There are no somatic complaints. Good program participation.   Treatment plan. We will continue Effexor 300 mg daily for depression and Zyprexa to 20 mg at bedtime for psychosis. Discharge tomorrow.  Social/disposition. The patient will be discharge with his wife. He will follow up with [facility].  Past psychiatric history. There is a history of mild depression treated with Cymbalta.   Family psychiatric history. Nonreported.  No other new information was available today.  Principal Problem: Severe episode of recurrent major depressive disorder, without psychotic features (HCC) Diagnosis:   Patient Active Problem List   Diagnosis Date Noted  . Diabetes (HCC) [E11.9] 02/21/2017  . HTN (hypertension) [I10] 02/21/2017  . GERD (gastroesophageal reflux disease) [K21.9] 02/21/2017  . Vitamin B12 deficiency [E53.8] 02/21/2017  . Severe episode of recurrent major depressive disorder, without psychotic features (HCC) [F33.2] 02/20/2017  . 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 Chest pain [R07.9] 10/29/2016  . Sinus tachycardia [R00.0] 10/29/2016  . Hyponatremia [E87.1] 10/29/2016  . Hypokalemia [E87.6] 10/29/2016   Total Time spent with patient: 30 minutes  Past Medical History:  Past Medical History:  Diagnosis Date  . Anemia   . Anxiety   . Depression   . Diabetes mellitus without complication (HCC)   . DOE (dyspnea on exertion)   . Early satiety   . Epigastric abdominal pain   . GERD (gastroesophageal reflux disease)   . Hyperlipidemia   . Irritable bowel syndrome   . Shingles   .  Tachycardia   . Unintended weight loss     Past Surgical History:  Procedure Laterality Date  . COLONOSCOPY WITH PROPOFOL N/A 11/19/2016   Procedure: COLONOSCOPY WITH PROPOFOL;  Surgeon: Scot Jun, MD;  Location: [facility] ENDOSCOPY;  Service: Endoscopy;  Laterality: N/A;  . ESOPHAGOGASTRODUODENOSCOPY (EGD) WITH PROPOFOL N/A 11/06/2016   Procedure:  ESOPHAGOGASTRODUODENOSCOPY (EGD) WITH PROPOFOL;  Surgeon: Scot Jun, MD;  Location: [facility] ENDOSCOPY;  Service: Endoscopy;  Laterality: N/A;  . FRACTURE SURGERY Left 2010   Clavicle  . TONSILLECTOMY    . TOOTH EXTRACTION     wisdom teeth x 4   Family History:  Family History  Problem Relation Age of Onset  . Diabetes Mother   . Heart disease Mother   . Cancer Mother   . CAD Mother   . Heart disease Father   . CAD Father   . Diabetes Sister   . Emphysema Maternal Grandfather   . Diabetes Paternal Grandmother   . Cancer Paternal Grandfather     Social History:  History  Alcohol Use No     History  Drug Use No    Social History   Social History  . Marital status: Married    Spouse name: N/A  . Number of children: N/A  . Years of education: N/A   Social History Main Topics  . Smoking status: Never Smoker  . Smokeless tobacco: Never Used  . Alcohol use No  . Drug use: No  . Sexual activity: Yes   Other Topics Concern  . None   Social History Narrative  . None   Additional Social History:    Pain Medications: See PTA Prescriptions: See PTA Over the Counter: See PTA History of alcohol / drug use?: No history of alcohol / drug abuse                    Sleep: Fair  Appetite:  Fair  Current Medications: Current Facility-Administered Medications  Medication Dose Route Frequency Provider Last Rate Last Dose  . acetaminophen (TYLENOL) tablet 650 mg  650 mg Oral Q6H PRN Darliss Ridgel, MD      . alum & mag hydroxide-simeth (MAALOX/MYLANTA) 200-200-20 MG/5ML suspension 30 mL  30 mL Oral Q4H PRN Darliss Ridgel, MD      . aspirin EC tablet 81 mg  81 mg Oral Daily Darliss Ridgel, MD   81 mg at 03/01/17 0835  . atorvastatin (LIPITOR) tablet 20 mg  20 mg Oral Daily Darliss Ridgel, MD   20 mg at 03/01/17 0836  . calamine lotion   Topical PRN Aundria Rud, MD      . carbamazepine (TEGRETOL) tablet 200 mg  200 mg Oral BID Darliss Ridgel, MD   200 mg  at 03/01/17 1716  . cholecalciferol (VITAMIN D) tablet 5,000 Units  5,000 Units Oral Daily Darliss Ridgel, MD   5,000 Units at 03/01/17 [phone]  . feeding supplement (GLUCERNA SHAKE) (GLUCERNA SHAKE) liquid 237 mL  237 mL Oral BID BM Darliss Ridgel, MD   237 mL at 03/01/17 1410  . folic acid (FOLVITE) tablet 0.5 mg  0.5 mg Oral Daily Darliss Ridgel, MD   0.5 mg at 03/01/17 0837  . gabapentin (NEURONTIN) capsule 600 mg  600 mg Oral TID Darliss Ridgel, MD   600 mg at 03/01/17 1716  . glimepiride (AMARYL) tablet 2 mg  2 mg Oral BID Darliss Ridgel, MD   2 mg at  03/01/17 1715  . lidocaine (LIDODERM) 5 % 1 patch  1 patch Transdermal Q24H Darliss Ridgel, MD   1 patch at 03/01/17 [phone]  . magnesium hydroxide (MILK OF MAGNESIA) suspension 30 mL  30 mL Oral Daily PRN Darliss Ridgel, MD      . metFORMIN (GLUCOPHAGE-XR) 24 hr tablet 500 mg  500 mg Oral BID Darliss Ridgel, MD   500 mg at 03/01/17 1715  . metoprolol tartrate (LOPRESSOR) tablet 12.5 mg  12.5 mg Oral BID Darliss Ridgel, MD   12.5 mg at 03/01/17 1716  . multivitamin with minerals tablet 1 tablet  1 tablet Oral Daily Darliss Ridgel, MD   1 tablet at 03/01/17 0835  . OLANZapine (ZYPREXA) tablet 20 mg  20 mg Oral QHS Arilynn Blakeney B, MD   20 mg at 02/28/17 2116  . omega-3 acid ethyl esters (LOVAZA) capsule 2 g  2 g Oral Daily Darliss Ridgel, MD   2 g at 03/01/17 0839  . pantoprazole (PROTONIX) EC tablet 40 mg  40 mg Oral Daily Darliss Ridgel, MD   40 mg at 03/01/17 0836  . protein supplement (PREMIER PROTEIN) liquid  11 oz Oral Q24H Darliss Ridgel, MD   11 oz at 02/28/17 2116  . sucralfate (CARAFATE) tablet 1 g  1 g Oral TID WC & HS Darliss Ridgel, MD   1 g at 03/01/17 1716  . venlafaxine XR (EFFEXOR-XR) 24 hr capsule 300 mg  300 mg Oral Q breakfast Tommie Bohlken B, MD   300 mg at 03/01/17 9562    Lab Results:  No results found for this or any previous visit (from the past 48 hour(s)).  Blood Alcohol level:  No results found for:  [facility]  Metabolic Disorder Labs: Lab Results  Component Value Date   HGBA1C 6.5 (H) 02/20/2017   MPG 139.85 02/20/2017   MPG 148 10/29/2016   No results found for: PROLACTIN Lab Results  Component Value Date   CHOL 158 02/20/2017   TRIG 215 (H) 02/20/2017   HDL 45 02/20/2017   CHOLHDL 3.5 02/20/2017   VLDL 43 (H) 02/20/2017   LDLCALC 70 02/20/2017    Physical Findings: AIMS: Facial and Oral Movements Muscles of Facial Expression: None, normal Lips and Perioral Area: None, normal Jaw: None, normal Tongue: None, normal,Extremity Movements Upper (arms, wrists, hands, fingers): None, normal Lower (legs, knees, ankles, toes): None, normal, Trunk Movements Neck, shoulders, hips: None, normal, Overall Severity Severity of abnormal movements (highest score from questions above): None, normal Incapacitation due to abnormal movements: None, normal Patient's awareness of abnormal movements (rate only patient's report): No Awareness, Dental Status Current problems with teeth and/or dentures?: No Does patient usually wear dentures?: No  CIWA:  CIWA-Ar Total: 2 COWS:  COWS Total Score: 2  Musculoskeletal: Strength & Muscle Tone: within normal limits Gait & Station: normal Patient leans: N/A  Psychiatric Specialty Exam: Physical Exam  Nursing note and vitals reviewed. Psychiatric: He has a normal mood and affect. His speech is normal and behavior is normal. Judgment and thought content normal. Cognition and memory are normal.    Review of Systems  Neurological: Negative.   Psychiatric/Behavioral: Negative.   All other systems reviewed and are negative.   Blood pressure 105/60, pulse 88, temperature 98.1 F (36.7 C), temperature source Oral, resp. rate 18, height  (1.727 m), weight 62.6 kg (138 lb), SpO2 100 %.Body mass index is 20.98 kg/m.  General Appearance: Casual and Fairly  Groomed  Eye Contact:  Good  Speech:  Clear and Coherent  Volume:  Normal  Mood: Euthymic   Affect:  reactive  Thought Process:  Goal Directed and Descriptions of Associations: Intact  Orientation:  Full (Time, Place, and Person)  Thought Content:  WDL  Suicidal Thoughts:  No  Homicidal Thoughts:  No  Memory:  Immediate;   Fair Recent;   Fair Remote;   Fair  Judgement:  Fair  Insight:  Fair and Shallow  Psychomotor Activity:  Normal  Concentration:  Concentration: Fair and Attention Span: Fair  Recall:  [employer] of Knowledge:  Fair  Language:  Fair  Akathisia:  No  Handed:  Right  AIMS (if indicated):     Assets:  Communication Skills Desire for Improvement Financial Resources/Insurance Housing Intimacy Physical Health Resilience Social Support Transportation Vocational/Educational  ADL's:  Intact  Cognition:  WNL  Sleep:  Number of Hours: 8     Treatment Plan Summary: Daily contact with patient to assess and evaluate symptoms and progress in treatment and Medication management   Mr. Hoffmeier a 56 year old male with new onset depression admitted for worsening of his symptoms in the face of severe psychosocial stressors. Although he presented with the whole syndrome of depressive symptoms, he has started improving on just a medication regimen. Although ECT was discussed as an option, he would not need it in all likelihood. Denies any suicidal thoughts or thoughts about dying now. Although he has a lot of psychosocial stresses including loss of job and his house, he wants to work towards making it all right.  1. Mood. We will continue Effexor 300 mg anf Zyprexa 20 mg for depression and psychosis.    2. Neuralgia. The patient has been maintained on Tegretol 200 mg twice daily.  3. Diabetes. The patient is on metformin and Glimepiride, and ADA diet.  4. Hypertension. He is on metoprolol  5. Dyslipidemia. Continue Lipitor.  6. GERD. He is on omeprazole.   7. Weight loss. We offered Glucerna.   8. Chronic pain. He is on tramadol as needed.    9. Hypertension. We continue metoprolol.  10. Vitamin B12 deficiency. We will continue supplementation. Vitamin B12 level is low will give injections for 3 days.  11. Metabolic syndrome monitoring.  Lipid panel shows elevated triglycerides, normal TSH and hemoglobin A1c.  12. EKG. Has not been done in spite of multiple requests.  13.  Dementia workup. RPR and HIV testing are negative.   14. Head CT scan is unremarkable.    15. Disposition. The patient will be discharged with his wife. He will follow up with [facility].  Kristine Linea, MD 03/01/2017, 5:24 PM

## 2017-03-02 MED ORDER — ATORVASTATIN CALCIUM 20 MG PO TABS: 20.0000 mg | ORAL_TABLET | Freq: Every day | ORAL | 1 refills | Status: DC

## 2017-03-02 MED ORDER — FOLIC ACID 400 MCG PO TABS: 400.0000 ug | ORAL_TABLET | Freq: Every day | ORAL | 1 refills | Status: DC

## 2017-03-02 MED ORDER — GABAPENTIN 300 MG PO CAPS: 600.0000 mg | ORAL_CAPSULE | Freq: Three times a day (TID) | ORAL | 1 refills | Status: DC

## 2017-03-02 MED ORDER — SUCRALFATE 1 G PO TABS: 1.0000 g | ORAL_TABLET | Freq: Three times a day (TID) | ORAL | 1 refills | Status: DC

## 2017-03-02 MED ORDER — VENLAFAXINE HCL ER 150 MG PO CP24: 300.0000 mg | ORAL_CAPSULE | Freq: Every day | ORAL | 1 refills | Status: DC

## 2017-03-02 MED ORDER — VITAMIN D 1000 UNITS PO TABS: 5000.0000 [IU] | ORAL_TABLET | Freq: Every day | ORAL | 1 refills | Status: DC

## 2017-03-02 MED ORDER — CARBAMAZEPINE 200 MG PO TABS: 200.0000 mg | ORAL_TABLET | Freq: Two times a day (BID) | ORAL | 3 refills | Status: DC

## 2017-03-02 MED ORDER — ASPIRIN EC 81 MG PO TBEC: 81.0000 mg | DELAYED_RELEASE_TABLET | Freq: Every day | ORAL | 1 refills | Status: DC

## 2017-03-02 MED ORDER — OMEPRAZOLE 20 MG PO CPDR: 40.0000 mg | DELAYED_RELEASE_CAPSULE | Freq: Every day | ORAL | 1 refills | Status: DC

## 2017-03-02 MED ORDER — OLANZAPINE 20 MG PO TABS: 20.0000 mg | ORAL_TABLET | Freq: Every day | ORAL | 1 refills | Status: DC

## 2017-03-02 MED ORDER — GLIMEPIRIDE 2 MG PO TABS: 2.0000 mg | ORAL_TABLET | Freq: Two times a day (BID) | ORAL | 1 refills | Status: DC

## 2017-03-02 MED ORDER — CYANOCOBALAMIN 1000 MCG PO TABS: 1000.0000 ug | ORAL_TABLET | Freq: Every day | ORAL | 1 refills | Status: DC

## 2017-03-02 MED ORDER — LIDOCAINE 5 % EX PTCH: 1.0000 | MEDICATED_PATCH | CUTANEOUS | 1 refills | Status: DC

## 2017-03-02 MED ORDER — METOPROLOL TARTRATE 25 MG PO TABS: 12.5000 mg | ORAL_TABLET | Freq: Two times a day (BID) | ORAL | 1 refills | Status: DC

## 2017-03-02 MED ORDER — OMEGA-3-ACID ETHYL ESTERS 1 G PO CAPS: 2.0000 g | ORAL_CAPSULE | Freq: Every day | ORAL | 1 refills | Status: DC

## 2017-03-02 MED ORDER — METFORMIN HCL ER 500 MG PO TB24: 500.0000 mg | ORAL_TABLET | Freq: Two times a day (BID) | ORAL | 1 refills | Status: DC

## 2017-03-02 NOTE — Progress Notes (Signed)
D: Pt A & O X4. Denies SI, HI, AVH and pain at this time. Presents anxious "I'm ready and happy to go home, despite everything"  with flat on approach. Endorsed being anxious related to d/c. Pt rates his depression 5/10, anxiety 3/10 and hopelessness 0/10. Pt d/c home per MD's order, picked up by his wife A: Scheduled medications administered as prescribed with verbal education and effects monitored. D/C instructions reviewed with pt including prescriptions, medication samples and follow up appointments. All belongings from locker 20 returned to pt prior to departure and compliance encouraged. Q 15 minutes safety checks maintained till time of departure without self harm gestures or outburst. R: Pt receptive to care. Verbalized understanding related to d/c instructions. Compliant with medications, denies adverse drug reactions. Ambulatory with a steady gait. Appears to be in no physical distress at this time. Safety maintained on and off unit till time of d/c.

## 2017-03-02 NOTE — [facility] Group Notes (Signed)
LCSW Group Therapy Note  03/02/2017 9:30am  Type of Therapy/Topic:  Group Therapy:  Feelings about Diagnosis  Participation Level:  Active   Description of Group:   This group will allow patients to explore their thoughts and feelings about diagnoses they have received. Patients will be guided to explore their level of understanding and acceptance of these diagnoses. Facilitator will encourage patients to process their thoughts and feelings about the reactions of others to their diagnosis and will guide patients in identifying ways to discuss their diagnosis with significant others in their lives. This group will be process-oriented, with patients participating in exploration of their own experiences, giving and receiving support, and processing challenge from other group members.   Therapeutic Goals: 1. Patient will demonstrate understanding of diagnosis as evidenced by identifying two or more symptoms of the disorder 2. Patient will be able to express two feelings regarding the diagnosis 3. Patient will demonstrate their ability to communicate their needs through discussion and/or role play  Summary of Patient Progress:  Pt able to meet therapeutic goals.  Verbalizes understanding of diagnosis.  Affect improved, more smiling and eye contact then previous.      Therapeutic Modalities:   Cognitive Behavioral Therapy Brief Therapy Feelings Identification    Glennon Mac, LCSW 03/02/2017 10:47 AM
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
## 2017-03-02 NOTE — [facility]HH Suicide Risk Assessment (Signed)
[facility] Discharge Suicide Risk Assessment   Principal Problem: Severe episode of recurrent major depressive disorder, with psychotic features [facility]) Discharge Diagnoses:  Patient Active Problem List   Diagnosis Date Noted  . Diabetes (HCC) [E11.9] 02/21/2017  . HTN (hypertension) [I10] 02/21/2017  . GERD (gastroesophageal reflux disease) [K21.9] 02/21/2017  . Vitamin B12 deficiency [E53.8] 02/21/2017  . Severe episode of recurrent major depressive disorder, with psychotic features (HCC) [F33.3] 02/20/2017  . 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 Chest pain [R07.9] 10/29/2016  . Sinus tachycardia [R00.0] 10/29/2016  . Hyponatremia [E87.1] 10/29/2016  . Hypokalemia [E87.6] 10/29/2016    Total Time spent with patient: 30 minutes  Musculoskeletal: Strength & Muscle Tone: within normal limits Gait & Station: normal Patient leans: N/A  Psychiatric Specialty Exam: Review of Systems  Neurological: Negative.   Psychiatric/Behavioral: Negative.   All other systems reviewed and are negative.   Blood pressure 106/71, pulse 97, temperature 98.2 F (36.8 C), temperature source Oral, resp. rate 16, height  (1.727 m), weight 62.6 kg (138 lb), SpO2 100 %.Body mass index is 20.98 kg/m.  General Appearance: Casual  Eye Contact::  Good  Speech:  Clear and Coherent409  Volume:  Normal  Mood:  Euthymic  Affect:  Appropriate  Thought Process:  Goal Directed and Descriptions of Associations: Intact  Orientation:  Full (Time, Place, and Person)  Thought Content:  WDL  Suicidal Thoughts:  No  Homicidal Thoughts:  No  Memory:  Immediate;   Fair Recent;   Fair Remote;   Fair  Judgement:  Impaired  Insight:  Present  Psychomotor Activity:  Normal  Concentration:  Fair  Recall:  [employer] of Knowledge:Fair  Language: Fair  Akathisia:  No  Handed:  Right  AIMS (if indicated):     Assets:  Communication Skills Desire for Improvement Resilience Social Support  Sleep:  Number of  Hours: 6.75  Cognition: WNL  ADL's:  Intact   Mental Status Per Nursing Assessment::   On Admission:     Demographic Factors:  Male, Caucasian, Low socioeconomic status and Unemployed  Loss Factors: Decrease in vocational status, Decline in physical health and Financial problems/change in socioeconomic status  Historical Factors: Family history of mental illness or substance abuse and Impulsivity  Risk Reduction Factors:   Sense of responsibility to family, Living with another person, especially a relative, Positive social support and Positive therapeutic relationship  Continued Clinical Symptoms:  Depression:   Severe Medical Diagnoses and Treatments/Surgeries  Cognitive Features That Contribute To Risk:  None    Suicide Risk:  Minimal: No identifiable suicidal ideation.  Patients presenting with no risk factors but with morbid ruminations; may be classified as minimal risk based on the severity of the depressive symptoms  Follow-up Information    [facility] [place]. Go on 03/10/2017.   Why:  3:30pm,  with Dr. Kern Reap information: [address] [place] [address] [phone]        [facility] Follow up.   Why:  10/17 11:00am, Philis Nettle for therapy Contact information: [address] [place] [address] [phone]  [phone]           Plan Of Care/Follow-up recommendations:  Activity:  as tolerated Diet:  low sodium heart healthy ADA diet Other:  keep fpllow up appointments  Kristine Linea, MD 03/02/2017, 9:33 AM

## 2017-03-02 NOTE — Progress Notes (Signed)
D: Patient is alert and oriented x 4, denies pain or discomfort, denies SI/HI/AVH. Patient's   affect is brighter, mood is pleasant and cooperative, patient was visible in the milieu,  appears less anxious or restless, rated depression 4/10 and he is interacting with peers and staff appropriately. Patient's thoughts are organized.  A: Pt was offered support and encouragement. Pt was given scheduled medications, and  was encouraged to attend groups. 15 minute checks were done for safety.  R:Pt attends groups and interacts well with peers and staff. Pt is complaint with medication, and receptive to treatment on the unit. Patient is happy about his discharge home tomorrow. Safety maintained on the unit will continue to monitor.

## 2017-03-02 NOTE — Progress Notes (Signed)
  [facility] Adult Case Management Discharge Plan :  Will you be returning to the same living situation after discharge:  Yes,    At discharge, do you have transportation home?: Yes,    Do you have the ability to pay for your medications: Yes,     Release of information consent forms completed and in the chart;  Patient's signature needed at discharge.  Patient to Follow up at: Follow-up Information    [facility] [place]. Go on 03/10/2017.   Why:  3:30pm,  with Dr. Kern Reap information: [address] [place] [address] [phone]        [facility] Follow up.   Why:  10/17 11:00am, Philis Nettle for therapy Contact information: [address] [place] [address] [phone]  [phone]           Next level of care provider has access to [facility] Link:no  Safety Planning and Suicide Prevention discussed: Yes,        Has patient been referred to the Quitline?: Patient refused referral  Patient has been referred for addiction treatment: Yes  Glennon Mac, LCSW 03/02/2017, 8:58 AM

## 2017-03-10 DIAGNOSIS — F331 Major depressive disorder, recurrent, moderate: Secondary | ICD-10-CM | POA: Insufficient documentation

## 2017-03-11 ENCOUNTER — Emergency Department
Admission: EM | Admit: 2017-03-11 | Discharge: 2017-03-12 | Disposition: A | Discharge status: Home / Self Care | Payer: 59 | Attending: Emergency Medicine | Admitting: Emergency Medicine

## 2017-03-11 DIAGNOSIS — R110 Nausea: Secondary | ICD-10-CM | POA: Insufficient documentation

## 2017-03-11 DIAGNOSIS — Z7984 Long term (current) use of oral hypoglycemic drugs: Secondary | ICD-10-CM | POA: Insufficient documentation

## 2017-03-11 DIAGNOSIS — I10 Essential (primary) hypertension: Secondary | ICD-10-CM | POA: Diagnosis not present

## 2017-03-11 DIAGNOSIS — R42 Dizziness and giddiness: Secondary | ICD-10-CM | POA: Diagnosis not present

## 2017-03-11 DIAGNOSIS — E119 Type 2 diabetes mellitus without complications: Secondary | ICD-10-CM | POA: Insufficient documentation

## 2017-03-11 DIAGNOSIS — F333 Major depressive disorder, recurrent, severe with psychotic symptoms: Secondary | ICD-10-CM | POA: Insufficient documentation

## 2017-03-11 DIAGNOSIS — R51 Headache: Secondary | ICD-10-CM | POA: Insufficient documentation

## 2017-03-11 DIAGNOSIS — Z7982 Long term (current) use of aspirin: Secondary | ICD-10-CM | POA: Diagnosis not present

## 2017-03-11 DIAGNOSIS — Z79899 Other long term (current) drug therapy: Secondary | ICD-10-CM | POA: Insufficient documentation

## 2017-03-11 LAB — URINALYSIS, COMPLETE (UACMP) WITH MICROSCOPIC
Bacteria, UA: NONE SEEN
Bilirubin Urine: NEGATIVE
Glucose, UA: 50 mg/dL — AB
Hgb urine dipstick: NEGATIVE
Ketones, ur: 5 mg/dL — AB
Leukocytes, UA: NEGATIVE
Nitrite: NEGATIVE
Protein, ur: NEGATIVE mg/dL
RBC / HPF: NONE SEEN RBC/hpf (ref 0–5)
Specific Gravity, Urine: 1.01 (ref 1.005–1.030)
Squamous Epithelial / HPF: NONE SEEN
pH: 8 (ref 5.0–8.0)

## 2017-03-11 LAB — COMPREHENSIVE METABOLIC PANEL
ALT: 20 U/L (ref 17–63)
AST: 24 U/L (ref 15–41)
Albumin: 4.5 g/dL (ref 3.5–5.0)
Alkaline Phosphatase: 47 U/L (ref 38–126)
Anion gap: 10 (ref 5–15)
BUN: 8 mg/dL (ref 6–20)
CO2: 25 mmol/L (ref 22–32)
Calcium: 9.6 mg/dL (ref 8.9–10.3)
Chloride: 105 mmol/L (ref 101–111)
Creatinine, Ser: 0.86 mg/dL (ref 0.61–1.24)
GFR calc Af Amer: >60 mL/min (ref >60)
GFR calc non Af Amer: >60 mL/min (ref >60)
Glucose, Bld: 139 mg/dL — ABNORMAL HIGH (ref 65–99)
Potassium: 3.5 mmol/L (ref 3.5–5.1)
Sodium: 140 mmol/L (ref 135–145)
Total Bilirubin: 0.9 mg/dL (ref 0.3–1.2)
Total Protein: 8.1 g/dL (ref 6.5–8.1)

## 2017-03-11 LAB — CBC
HCT: 40.3 % (ref 40.0–52.0)
Hemoglobin: 13.7 g/dL (ref 13.0–18.0)
MCH: 31.6 pg (ref 26.0–34.0)
MCHC: 34.1 g/dL (ref 32.0–36.0)
MCV: 92.6 fL (ref 80.0–100.0)
Platelets: 258 K/uL (ref 150–440)
RBC: 4.35 MIL/uL — ABNORMAL LOW (ref 4.40–5.90)
RDW: 13.7 % (ref 11.5–14.5)
WBC: 9.9 K/uL (ref 3.8–10.6)

## 2017-03-11 LAB — LIPASE, BLOOD: Lipase: 23 U/L (ref 11–51)

## 2017-03-11 LAB — TROPONIN I: Troponin I: <0.03 ng/mL

## 2017-03-11 MED ORDER — ONDANSETRON 4 MG PO TBDP
4.0000 mg | ORAL_TABLET | Freq: Once | ORAL | Status: AC | PRN
  Administered 2017-03-11: 4 mg via ORAL
  Filled 2017-03-11: qty 1

## 2017-03-11 NOTE — ED Provider Notes (Signed)
[facility] Emergency Department Provider Note   ____________________________________________   I have reviewed the triage vital signs and the nursing notes.   HISTORY  Chief Complaint Concern for serotonin syndrome  History limited by: Not Limited   HPI Jeffrey Velazquez is a 56 y.o. male who presents to the emergency department today because of physician's concern for possible serotonin syndrome.  DURATION:couple of days TIMING: constant QUALITY: patient has had n/v. Headache. dizziness CONTEXT: patient states that he had a recent hospitalization. He was started on multiple new medications. Talked to his doctor yesterday and then psychiatrist today who both raised concern for possible serotonin syndrome MODIFYING FACTORS: none identified ASSOCIATED SYMPTOMS: patient states he has had some increased tremors since last hospitalization. Denies any fevers  Per medical record review patient has a history of depression  Past Medical History:  Diagnosis Date  . Anemia   . Anxiety   . Depression   . Diabetes mellitus without complication (HCC)   . DOE (dyspnea on exertion)   . Early satiety   . Epigastric abdominal pain   . GERD (gastroesophageal reflux disease)   . Hyperlipidemia   . Irritable bowel syndrome   . Shingles   . Tachycardia   . Unintended weight loss     Patient Active Problem List   Diagnosis Date Noted  . Diabetes (HCC) 02/21/2017  . HTN (hypertension) 02/21/2017  . GERD (gastroesophageal reflux disease) 02/21/2017  . Vitamin B12 deficiency 02/21/2017  . 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 Insomnia due to mental disorder   . Chest pain 10/29/2016  . Sinus tachycardia 10/29/2016  . Hyponatremia 10/29/2016  . Hypokalemia 10/29/2016    Past Surgical History:  Procedure Laterality Date  . COLONOSCOPY WITH PROPOFOL N/A 11/19/2016   Procedure: COLONOSCOPY WITH  PROPOFOL;  Surgeon: Scot JunElliott, Robert T, MD;  [facility] END[facility]COPY;  Service: Endoscopy;  Laterality: N/A;  . ESOPHAGOGASTRODUODENOSCOPY (EGD) WITH PROPOFOL N/A 11/06/2016   Procedure: ESOPHAGOGASTRODUODENOSCOPY (EGD) WITH PROPOFOL;  Surgeon: Scot JunElliott, Robert T, MD;  [facility];  Service: Endoscopy;  Laterality: N/A;  . FRACTURE SURGERY Left 2010   Clavicle  . TONSILLECTOMY    . TOOTH EXTRACTION     wisdom teeth x 4    Prior to Admission medications   Medication Sig Start Date End Date Taking? Authorizing Provider  aspirin EC 81 MG tablet Take 1 tablet (81 mg total) by mouth daily. 03/02/17   Pucilowska, Jolanta B, MD  atorvastatin (LIPITOR) 20 MG tablet Take 1 tablet (20 mg total) by mouth daily. 03/02/17   Pucilowska, Braulio ConteJolanta B, MD  carbamazepine (TEGRETOL) 200 MG tablet Take 1 tablet (200 mg total) by mouth 2 (two) times daily. 03/02/17   Pucilowska, Braulio ConteJolanta B, MD  cholecalciferol (VITAMIN D) 1000 units tablet Take 5 tablets (5,000 Units total) by mouth daily. 03/02/17   Pucilowska, Braulio ConteJolanta B, MD  cyanocobalamin 1000 MCG tablet Take 1 tablet (1,000 mcg total) by mouth daily. 03/02/17   Pucilowska, Ellin GoodieJolanta B, MD  folic acid (FOLVITE) 400 MCG tablet Take 1 tablet (400 mcg total) by mouth daily. 03/02/17   Pucilowska, Braulio ConteJolanta B, MD  gabapentin (NEURONTIN) 300 MG capsule Take 2 capsules (600 mg total) by mouth 3 (three) times daily. 03/02/17   Pucilowska, Jolanta B, MD  glimepiride (AMARYL) 2 MG tablet Take 1 tablet (2 mg total) by mouth 2 (two) times daily. 03/02/17   Pucilowska, Ellin GoodieJolanta B, MD  Lancets 30G MISC by  Does not apply route.    [provider]  lidocaine (LIDODERM) 5 % Place 1 patch onto the skin daily. Remove & Discard patch within 12 hours or as directed by MD 03/02/17   Pucilowska, Braulio Conte B, MD  Melatonin 5 MG TABS Take 10 mg by mouth at bedtime.     [provider]  metFORMIN (GLUCOPHAGE-XR) 500 MG 24 hr tablet Take 1 tablet (500 mg total) by  mouth 2 (two) times daily. 03/02/17   Pucilowska, Braulio Conte B, MD  metoprolol tartrate (LOPRESSOR) 25 MG tablet Take 0.5 tablets (12.5 mg total) by mouth 2 (two) times daily. 03/02/17   Pucilowska, Ellin Goodie, MD  Multiple Vitamin (MULTI-VITAMINS) TABS Take 1 tablet by mouth daily.    [provider]  OLANZapine (ZYPREXA) 20 MG tablet Take 1 tablet (20 mg total) by mouth at bedtime. 03/02/17   Pucilowska, Braulio Conte B, MD  omega-3 acid ethyl esters (LOVAZA) 1 g capsule Take 2 capsules (2 g total) by mouth daily. 03/02/17   Pucilowska, Braulio Conte B, MD  omeprazole (PRILOSEC) 20 MG capsule Take 2 capsules (40 mg total) by mouth daily. 03/02/17   Pucilowska, Jolanta B, MD  sucralfate (CARAFATE) 1 g tablet Take 1 tablet (1 g total) by mouth 4 (four) times daily -  with meals and at bedtime. 03/02/17   Pucilowska, Braulio Conte B, MD  venlafaxine XR (EFFEXOR-XR) 150 MG 24 hr capsule Take 2 capsules (300 mg total) by mouth daily with breakfast. 03/02/17   Pucilowska, Jolanta B, MD  VIAGRA 50 MG tablet Take 50 mg by mouth daily as needed for erectile dysfunction. 09/25/16   [provider]    Allergies Codeine  Family History  Problem Relation Age of Onset  . Diabetes Mother   . Heart disease Mother   . Cancer Mother   . CAD Mother   . Heart disease Father   . CAD Father   . Diabetes Sister   . Emphysema Maternal Grandfather   . Diabetes Paternal Grandmother   . Cancer Paternal Grandfather     Social History Social History  Substance Use Topics  . Smoking status: Never Smoker  . Smokeless tobacco: Never Used  . Alcohol use No    Review of Systems Constitutional: No fever/chills Eyes: No visual changes. ENT: No sore throat. Cardiovascular: Denies chest pain. Respiratory: Denies shortness of breath. Gastrointestinal: Positive for nausea and vomiting. Genitourinary: Negative for dysuria. Musculoskeletal: Negative for back pain. Skin: Negative for rash. Neurological: Positive for  headache.  ____________________________________________   PHYSICAL EXAM:  VITAL SIGNS: ED Triage Vitals  Enc Vitals Group     BP 03/11/17 1922 131/79     Pulse Rate 03/11/17 1922 (!) 112     Resp 03/11/17 1922 20     Temp 03/11/17 1922 99.5 F (37.5 C)     Temp Source 03/11/17 1922 Oral     SpO2 03/11/17 1922 98 %     Weight 03/11/17 1929 154 lb (69.9 kg)     Height 03/11/17 1929 5\' 8"  (1.727 m)     Head Circumference --      Peak Flow --      Pain Score 03/11/17 1929 6   Constitutional: Alert and oriented. Well appearing and in no distress. Eyes: Conjunctivae are normal.  ENT   Head: Normocephalic and atraumatic.   Nose: No congestion/rhinnorhea.   Mouth/Throat: Mucous membranes are moist.   Neck: No stridor. Hematological/Lymphatic/Immunilogical: No cervical lymphadenopathy. Cardiovascular: Normal rate, regular rhythm.  No murmurs,  rubs, or gallops.  Respiratory: Normal respiratory effort without tachypnea nor retractions. Breath sounds are clear and equal bilaterally. No wheezes/rales/rhonchi. Gastrointestinal: Soft and non tender. No rebound. No guarding.  Genitourinary: Deferred Musculoskeletal: Normal range of motion in all extremities. No lower extremity edema. Neurologic:  Normal speech and language. No gross focal neurologic deficits are appreciated.  Skin:  Skin is warm, dry and intact. No rash noted. Psychiatric: Mood and affect are normal. Speech and behavior are normal. Patient exhibits appropriate insight and judgment.  ____________________________________________    LABS (pertinent positives/negatives)  Lipase 23 CMP wnl except for gluocse 139 CBC wnl except for RBC UA not consistent with infection Trop <0.03  ________________________________________   EKG  I, Phineas Semen, attending physician, personally viewed and interpreted this EKG  EKG Time: 1927 Rate: 109 Rhythm: sinus tachycardia Axis: normal Intervals: qtc 449 QRS:  narrow ST changes: no st elevation Impression: abnormal ekg   ____________________________________________    RADIOLOGY  None  ____________________________________________   PROCEDURES  Procedures  ____________________________________________   INITIAL IMPRESSION / ASSESSMENT AND PLAN / ED COURSE  Pertinent labs & imaging results that were available during my care of the patient were reviewed by me and considered in my medical decision making (see chart for details).  Patient presented to the emergency department today because of concerns for possible serotonin syndrome and medication management. Exam patient does have some tachycardia and is quite some abdominal discomfort however no other obvious symptoms of serotonin syndrome. Differential would include abdominal infection, viral issues, electrolyte abnormalities. Blood work however unremarkable. Will have her have patient be evaluated by psychiatry. Discussed plan to have psychiatry evaluate patient with patient and family.  ____________________________________________   FINAL CLINICAL IMPRESSION(S) / ED DIAGNOSES  Nausea Dizziness Headache  Note: This dictation was prepared with Dragon dictation. Any transcriptional errors that result from this process are unintentional     Phineas Semen, MD 03/11/17 2333

## 2017-03-11 NOTE — ED Triage Notes (Signed)
Patient c/o dizziness, abdominal pain, N/V beginning last night. Patient's PCP wanted patient evaluated for serotonin reuptake syndrome per patient. Patient has a hx of tachycardia.

## 2017-03-12 NOTE — ED Notes (Signed)
Reviewed d/c instructions, follow-up care with patient. Patient verbalized understanding.  

## 2017-03-12 NOTE — ED Provider Notes (Signed)
I assumed care of the patient from Dr. Derrill KayGoodman at 11:30 PM. Patient evaluated [facility]al[facility]  psychiatrist on callwho agreed with Dr. Derrill KayGoodman that there are no signs or symptoms consistent with serotonin reuptake syndrome. I spoke with the patient and significant other at bedside who stated they really came to the emergency department to have their medications managed. Patient significant other states that Dr. Jennet MaduroPucilowska has referred them to his psychiatrist however that appointment is not until December 7. I informed both the patient and his significant other that his medication management should be performed by psychiatrist. I offered [facility] the [facility] to [facility] department until Dr. Mat Carnelay packs arrived this morning. Patient however requested to be discharged at this time stating that they will follow up with Dr. Tawny AsalPucilowska   Kerline Trahan, Enedina Finnerandolph N, MD 03/12/17 [id number]

## 2017-03-12 NOTE — ED Notes (Signed)
Saltine crackers and ginger ale given to patient.

## 2017-03-12 NOTE — ED Notes (Signed)
ED Provider at bedside. 

## 2017-06-05 ENCOUNTER — Other Ambulatory Visit: Payer: Self-pay

## 2017-06-05 ENCOUNTER — Emergency Department
Admission: EM | Admit: 2017-06-05 | Discharge: 2017-06-07 | Disposition: A | Discharge status: Other Acute Inpatient Hospital | Payer: 59 | Attending: Emergency Medicine | Admitting: Emergency Medicine

## 2017-06-05 DIAGNOSIS — I10 Essential (primary) hypertension: Secondary | ICD-10-CM | POA: Insufficient documentation

## 2017-06-05 DIAGNOSIS — Z7984 Long term (current) use of oral hypoglycemic drugs: Secondary | ICD-10-CM | POA: Insufficient documentation

## 2017-06-05 DIAGNOSIS — E119 Type 2 diabetes mellitus without complications: Secondary | ICD-10-CM | POA: Diagnosis not present

## 2017-06-05 DIAGNOSIS — F332 Major depressive disorder, recurrent severe without psychotic features: Secondary | ICD-10-CM | POA: Diagnosis not present

## 2017-06-05 DIAGNOSIS — Z79899 Other long term (current) drug therapy: Secondary | ICD-10-CM | POA: Insufficient documentation

## 2017-06-05 DIAGNOSIS — F329 Major depressive disorder, single episode, unspecified: Secondary | ICD-10-CM | POA: Diagnosis present

## 2017-06-05 DIAGNOSIS — Z7982 Long term (current) use of aspirin: Secondary | ICD-10-CM | POA: Insufficient documentation

## 2017-06-05 LAB — CBC
HCT: 38.1 % — ABNORMAL LOW (ref 40.0–52.0)
Hemoglobin: 13.4 g/dL (ref 13.0–18.0)
MCH: 32 pg (ref 26.0–34.0)
MCHC: 35.2 g/dL (ref 32.0–36.0)
MCV: 90.7 fL (ref 80.0–100.0)
Platelets: 234 10*3/uL (ref 150–440)
RBC: 4.2 MIL/uL — ABNORMAL LOW (ref 4.40–5.90)
RDW: 13.6 % (ref 11.5–14.5)
WBC: 8.5 10*3/uL (ref 3.8–10.6)

## 2017-06-05 LAB — COMPREHENSIVE METABOLIC PANEL
ALT: 10 U/L — ABNORMAL LOW (ref 17–63)
AST: 20 U/L (ref 15–41)
Albumin: 4.6 g/dL (ref 3.5–5.0)
Alkaline Phosphatase: 58 U/L (ref 38–126)
Anion gap: 10 (ref 5–15)
BUN: 10 mg/dL (ref 6–20)
CO2: 24 mmol/L (ref 22–32)
Calcium: 9.4 mg/dL (ref 8.9–10.3)
Chloride: 104 mmol/L (ref 101–111)
Creatinine, Ser: 0.86 mg/dL (ref 0.61–1.24)
GFR calc Af Amer: >60 mL/min (ref >60)
GFR calc non Af Amer: >60 mL/min (ref >60)
Glucose, Bld: 172 mg/dL — ABNORMAL HIGH (ref 65–99)
Potassium: 3.6 mmol/L (ref 3.5–5.1)
Sodium: 138 mmol/L (ref 135–145)
Total Bilirubin: 1.9 mg/dL — ABNORMAL HIGH (ref 0.3–1.2)
Total Protein: 8 g/dL (ref 6.5–8.1)

## 2017-06-05 LAB — URINE DRUG SCREEN, QUALITATIVE (ARMC ONLY)
Amphetamines, Ur Screen: NOT DETECTED
Barbiturates, Ur Screen: NOT DETECTED
Benzodiazepine, Ur Scrn: NOT DETECTED
Cannabinoid 50 Ng, Ur ~~LOC~~: NOT DETECTED
Cocaine Metabolite,Ur ~~LOC~~: NOT DETECTED
MDMA (Ecstasy)Ur Screen: NOT DETECTED
Methadone Scn, Ur: NOT DETECTED
Opiate, Ur Screen: NOT DETECTED
Phencyclidine (PCP) Ur S: NOT DETECTED
Tricyclic, Ur Screen: NOT DETECTED

## 2017-06-05 LAB — ACETAMINOPHEN LEVEL: Acetaminophen (Tylenol), Serum: <10 ug/mL — ABNORMAL LOW (ref 10–30)

## 2017-06-05 LAB — SALICYLATE LEVEL: Salicylate Lvl: <7 mg/dL (ref 2.8–30.0)

## 2017-06-05 LAB — ETHANOL: Alcohol, Ethyl (B): <10 mg/dL (ref <10)

## 2017-06-05 NOTE — ED Notes (Signed)
TTS talking to patient at this time. 

## 2017-06-05 NOTE — ED Notes (Signed)
PT wife is taking all of pts belongings with her

## 2017-06-05 NOTE — ED Notes (Signed)
Patient is IVC and is pending inpatient placement. 

## 2017-06-05 NOTE — ED Notes (Signed)
Wife called, wanted to be called if patient is moved tonight.

## 2017-06-05 NOTE — Care Management (Signed)
Per BHU request, went to lobby and conducted PTA medication interview with wife. While noting medications, spouse began to relate the developments that led to current situation.   Wife expressed concern that current combination of medications were either 1) interacting with each other to create current changes in character/demeanor or 2) interacting with patient's biochemistry to create changes. Wife reports these changes have come on over the past 2 years. Expressed concern about frequent nightmares, disinterest in family (grandchildren) and hobbies (history) and general dispassion in most activities. Reported patient tends to stay in bed and gravitates to hot environments (e.g. Baths). Wife made it clear she expects a course of action that, ideally, includes reducing patient's medication load.   I offered no promises other than to relay her concerns to the care team. I made no evaluative remarks or therapeutic recommendations.

## 2017-06-05 NOTE — ED Notes (Signed)
Wife given passcode [id number]

## 2017-06-05 NOTE — BH Assessment (Signed)
Assessment Note  Jeffrey CreekJohn David Velazquez is an 57 y.o. male who presents to the ED voluntarily with feelings of severe depression. Pt reports the recent loss of his job, home, and storage unit has caused his depressive episode to spiral out of control. Pt reports having feelings of hopelessness and despair and can't see an end to his troubles.  He reports having been inpatient [facility] October of [facility] for aproximately 10 days for a prior depressive episode.  Pt reports a hx of shingles that caused him to lose 60 lbs but doesn't report any other weight loss or gain. He states that his sleep cycles are poor waking up every 2-3 hours nightly. Pt denies any drug and alcohol use but does report having a very poor appetite stating that he just "hasn't felt like eating". Pt denies SI/HI A/V H.   Diagnosis: Major Depressive Disorder   Past Medical History:  Past Medical History:  Diagnosis Date  . Anemia   . Anxiety   . Depression   . Diabetes mellitus without complication (HCC)   . DOE (dyspnea on exertion)   . Early satiety   . Epigastric abdominal pain   . GERD (gastroesophageal reflux disease)   . Hyperlipidemia   . Irritable bowel syndrome   . Shingles   . Tachycardia   . Unintended weight loss     Past Surgical History:  Procedure Laterality Date  . COLONOSCOPY WITH PROPOFOL N/A 11/19/2016   Procedure: COLONOSCOPY WITH PROPOFOL;  Surgeon: Scot JunElliott, Robert T, MD;  [facility];  Service: Endoscopy;  Laterality: N/A;  . ESOPHAGOGASTRODUODENOSCOPY (EGD) WITH PROPOFOL N/A 11/06/2016   Procedure: ESOPHAGOGASTRODUODENOSCOPY (EGD) WITH PROPOFOL;  Surgeon: Scot JunElliott, Robert T, MD;  [facility];  Service: Endoscopy;  Laterality: N/A;  . FRACTURE SURGERY Left 2010   Clavicle  . TONSILLECTOMY    . TOOTH EXTRACTION     wisdom teeth x 4    Family History:  Family History  Problem Relation Age of Onset  . Diabetes Mother   . Heart disease Mother   . Cancer Mother    . CAD Mother   . Heart disease Father   . CAD Father   . Diabetes Sister   . Emphysema Maternal Grandfather   . Diabetes Paternal Grandmother   . Cancer Paternal Grandfather     Social History:  reports that  has never smoked. he has never used smokeless tobacco. He reports that he does not drink alcohol or use drugs.  Additional Social History:  Alcohol / Drug Use Pain Medications: See MAR Prescriptions: See MAR Over the Counter: See MAR History of alcohol / drug use?: No history of alcohol / drug abuse  CIWA: CIWA-Ar BP: 104/82 Pulse Rate: 92 COWS:    Allergies:  Allergies  Allergen Reactions  . Codeine Nausea Only and Other (See Comments)    dizziness    Home Medications:  (Not in a hospital admission)  OB/GYN Status:  No LMP for male patient.  General Assessment Data [facility] ED TTS Assessment: In system Is this a Tele or Face-to-Face Assessment?: Face-to-Face Is this an Initial Assessment or a Re-assessment for this encounter?: Initial Assessment Marital status: Married Is patient pregnant?: No Pregnancy Status: No Living Arrangements: Spouse/significant other Can pt return to current living arrangement?: Yes Admission Status: Voluntary Is patient capable of signing voluntary admission?: Yes Referral Source: Self/Family/Friend Insurance type: [employer]  Medical Screening Exam [facility] ONLY) Medical Exam completed: Yes  Crisis Care Plan Living Arrangements: Spouse/significant other Legal Guardian: Other:(Self) Name of Therapist: Philis Nettle  Education Status Is patient currently in school?: No Highest grade of school patient has completed: 12th Name of school: [employer]  Risk to self with the past 6 months Suicidal Ideation: No Has patient been a risk to self within the past 6 months prior to admission? : No Suicidal Intent: No Has patient had any suicidal intent within the past 6 months prior to  admission? : No Is patient at risk for suicide?: No Suicidal Plan?: No Has patient had any suicidal plan within the past 6 months prior to admission? : No Access to Means: No What has been your use of drugs/alcohol within the last 12 months?: pt denies use Previous Attempts/Gestures: No How many times?: 0 Other Self Harm Risks: n/a Triggers for Past Attempts: None known Intentional Self Injurious Behavior: None Family Suicide History: No Recent stressful life event(s): Job Loss, Financial Problems, Loss (Comment)(Lost all personal belongings in storgage.) Persecutory voices/beliefs?: No Depression: Yes Depression Symptoms: Fatigue, Insomnia, Feeling worthless/self pity, Loss of interest in usual pleasures Substance abuse history and/or treatment for substance abuse?: No Suicide prevention information given to non-admitted patients: Not applicable  Risk to Others within the past 6 months Homicidal Ideation: No Does patient have any lifetime risk of violence toward others beyond the six months prior to admission? : No Thoughts of Harm to Others: No Current Homicidal Intent: No Current Homicidal Plan: No Access to Homicidal Means: No Identified Victim: n/a History of harm to others?: No Assessment of Violence: None Noted Violent Behavior Description: none noted Does patient have access to weapons?: Yes (Comment) Criminal Charges Pending?: No Does patient have a court date: No Is patient on probation?: No  Psychosis Hallucinations: None noted Delusions: None noted  Mental Status Report Appearance/Hygiene: In scrubs Eye Contact: Good Motor Activity: Freedom of movement Speech: Logical/coherent, Soft Level of Consciousness: Alert Mood: Depressed, Despair, Empty, Helpless, Sad, Worthless, low self-esteem Affect: Appropriate to circumstance, Depressed, Sad Anxiety Level: None Thought Processes: Coherent, Relevant Judgement: Unimpaired Orientation: Person, Place, Time,  Situation, Appropriate for developmental age Obsessive Compulsive Thoughts/Behaviors: None  Cognitive Functioning Concentration: Normal Memory: Recent Intact, Remote Intact IQ: Average Insight: Good Impulse Control: Good Appetite: Poor Weight Loss: 60(Due to shingles ) Weight Gain: 0 Sleep: Decreased Total Hours of Sleep: 3(pt reports only sleeping 2-3 hours at a time) Vegetative Symptoms: None     Prior Inpatient Therapy Prior Inpatient Therapy: Yes Prior Therapy Dates: 02/2017 Prior Therapy Facilty/Provider(s): [facility] Reason for Treatment: Depression, pain  Prior Outpatient Therapy Prior Outpatient Therapy: No Does patient have an ACCT team?: No Does patient have Intensive In-House Services?  : No Does patient have [facility] services? : No Does patient have P4CC services?: No  ADL Screening (condition at time of admission) Is the patient deaf or have difficulty hearing?: No Does the patient have difficulty seeing, even when wearing glasses/contacts?: No Does the patient have difficulty concentrating, remembering, or making decisions?: No Does the patient have difficulty dressing or bathing?: No Does the patient have difficulty walking or climbing stairs?: Yes Weakness of Legs: Both Weakness of Arms/Hands: Both  Home Assistive Devices/Equipment Home Assistive Devices/Equipment: Cane (specify quad or straight)  Therapy Consults (therapy consults require a physician order) PT Evaluation Needed: No OT Evalulation Needed: No SLP Evaluation Needed: No Abuse/Neglect Assessment (Assessment to be complete while patient is alone) Abuse/Neglect Assessment Can Be Completed: Yes Physical Abuse: Denies Verbal Abuse: Denies Sexual  Abuse: Denies Exploitation of patient/patient's resources: Denies Self-Neglect: Denies Values / Beliefs Cultural Requests During Hospitalization: None Spiritual Requests During Hospitalization: None Consults Spiritual Care Consult Needed:  No Social Work Consult Needed: No      Additional Information 1:1 In Past 12 Months?: No CIRT Risk: No Elopement Risk: No Does patient have medical clearance?: Yes  Child/Adolescent Assessment Running Away Risk: Denies Bed-Wetting: Denies Destruction of Property: Denies Cruelty to Animals: Denies Stealing: Denies Rebellious/Defies Authority: Denies Satanic Involvement: Denies Archivist: Denies Problems at [employer]: Denies Gang Involvement: Denies  Disposition:  Disposition Initial Assessment Completed for this Encounter: Yes Disposition of Patient: Pending Review with psychiatrist  On Site Evaluation by:   Reviewed with Physician:    Brinlee Gambrell D Sal Spratley 06/05/2017 8:44 PM

## 2017-06-05 NOTE — ED Notes (Signed)
ENVIRONMENTAL ASSESSMENT  Potentially harmful objects out of patient reach: Yes.  Personal belongings secured: Yes.  Patient dressed in hospital provided attire only: Yes.  Plastic bags out of patient reach: Yes.  Patient care equipment (cords, cables, call bells, lines, and drains) shortened, removed, or accounted for: Yes.  Equipment and supplies removed from bottom of stretcher: Yes.  Potentially toxic materials out of patient reach: Yes.  Sharps container removed or out of patient reach: Yes.   BEHAVIORAL HEALTH ROUNDING  Patient sleeping: No.  Patient alert and oriented: yes  Behavior appropriate: Yes. ; If no, describe:  Nutrition and fluids offered: Yes  Toileting and hygiene offered: Yes  Sitter present: not applicable, Q 15 min safety rounds and observation.  Law enforcement present: Yes ODS  ED BHU PLACEMENT JUSTIFICATION  Is the patient under IVC or is there intent for IVC: Yes.  Is the patient medically cleared: Yes.  Is there vacancy in the ED BHU: Yes.  Is the population mix appropriate for patient: Yes.  Is the patient awaiting placement in inpatient or outpatient setting: Yes.  Has the patient had a psychiatric consult: Yes.  Survey of unit performed for contraband, proper placement and condition of furniture, tampering with fixtures in bathroom, shower, and each patient room: Yes. ; Findings: All clear  APPEARANCE/BEHAVIOR  calm, cooperative and adequate rapport can be established  NEURO ASSESSMENT  Orientation: time, place and person  Hallucinations: No.None noted (Hallucinations)  Speech: Normal  Gait: normal  RESPIRATORY ASSESSMENT  WNL  CARDIOVASCULAR ASSESSMENT  WNL  GASTROINTESTINAL ASSESSMENT  WNL  EXTREMITIES  WNL  PLAN OF CARE  Provide calm/safe environment. Vital signs assessed TID. ED BHU Assessment once each 12-hour shift. Collaborate with TTS daily or as condition indicates. Assure the ED provider has rounded once each shift. Provide and  encourage hygiene. Provide redirection as needed. Assess for escalating behavior; address immediately and inform ED provider.  Assess family dynamic and appropriateness for visitation as needed: Yes. ; If necessary, describe findings:  Educate the patient/family about BHU procedures/visitation: Yes. ; If necessary, describe findings: Pt is calm and cooperative at this time. Pt understanding and accepting of unit procedures/rules. Will continue to monitor with Q 15 min safety rounds and observation.      

## 2017-06-05 NOTE — ED Notes (Signed)
Pt. Wife left 2nd phone [phone].

## 2017-06-05 NOTE — ED Provider Notes (Signed)
[facility] Emergency Department Provider Note   ____________________________________________   First MD Initiated Contact with Patient 06/05/17 1502     (approximate)  I have reviewed the triage vital signs and the nursing notes.   HISTORY  Chief Complaint Depression    HPI Jeffrey Velazquez is a 57 y.o. male presents accompanied by his wife  Patient and his wife both report that his symptoms of depression seem to be worsening over time.  He has very little energy, does not want to do anything, he gets occasionally agitated at times, and he has had some passive thoughts about death.  He denies active suicidal ideation.  Denies any desire to harm himself or anyone else.  His wife does report he got upset last night but no harm was done.  He is seen outpatient psychiatry, having some trouble getting regular clinic appointment so due to insurance.  Has known depression.  Also diabetes.  Denies hallucinations.  Wife reports his memory seems to be poor.  Past Medical History:  Diagnosis Date  . Anemia   . Anxiety   . Depression   . Diabetes mellitus without complication (HCC)   . DOE (dyspnea on exertion)   . Early satiety   . Epigastric abdominal pain   . GERD (gastroesophageal reflux disease)   . Hyperlipidemia   . Irritable bowel syndrome   . Shingles   . Tachycardia   . Unintended weight loss     Patient Active Problem List   Diagnosis Date Noted  . Diabetes (HCC) 02/21/2017  . HTN (hypertension) 02/21/2017  . GERD (gastroesophageal reflux disease) 02/21/2017  . Vitamin B12 deficiency 02/21/2017  . 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 Insomnia due to mental disorder   . Chest pain 10/29/2016  . Sinus tachycardia 10/29/2016  . Hyponatremia 10/29/2016  . Hypokalemia 10/29/2016    Past Surgical History:  Procedure Laterality Date  . COLONOSCOPY WITH PROPOFOL N/A 11/19/2016     Procedure: COLONOSCOPY WITH PROPOFOL;  Surgeon: Scot Jun, MD;  Location: [facility] ENDOSCOPY;  Service: Endoscopy;  Laterality: N/A;  . ESOPHAGOGASTRODUODENOSCOPY (EGD) WITH PROPOFOL N/A 11/06/2016   Procedure: ESOPHAGOGASTRODUODENOSCOPY (EGD) WITH PROPOFOL;  Surgeon: Scot Jun, MD;  Location[facility] ENDOSCOPY;  Service: Endoscopy;  Laterality: N/A;  . FRACTURE SURGERY Left 2010   Clavicle  . TONSILLECTOMY    . TOOTH EXTRACTION     wisdom teeth x 4    Prior to Admission medications   Medication Sig Start Date End Date Taking? Authorizing Provider  aspirin EC 81 MG tablet Take 1 tablet (81 mg total) by mouth daily. 03/02/17  Yes Pucilowska, Jolanta B, MD  atorvastatin (LIPITOR) 20 MG tablet Take 1 tablet (20 mg total) by mouth daily. 03/02/17  Yes Pucilowska, Jolanta B, MD  gabapentin (NEURONTIN) 300 MG capsule Take 2 capsules (600 mg total) by mouth 3 (three) times daily. Patient taking differently: Take 600-900 mg by mouth 3 (three) times daily. Take 2 capsules by mouth every morning, 2 every day at lunch and 3 capsules by mouth at bedtime 03/02/17  Yes Pucilowska, Jolanta B, MD  glimepiride (AMARYL) 2 MG tablet Take 1 tablet (2 mg total) by mouth 2 (two) times daily. 03/02/17  Yes Pucilowska, Jolanta B, MD  Lancets 30G MISC by Does not apply route.   Yes [provider]  lidocaine (LIDODERM) 5 % Place 1 patch onto the skin daily. Remove & Discard patch within 12  hours or as directed by MD 03/02/17  Yes Pucilowska, Jolanta B, MD  metFORMIN (GLUCOPHAGE) 1000 MG tablet Take 1,000 mg by mouth 2 (two) times daily with a meal.   Yes [provider]  OLANZapine (ZYPREXA) 20 MG tablet Take 1 tablet (20 mg total) by mouth at bedtime. 03/02/17  Yes Pucilowska, Jolanta B, MD  omega-3 acid ethyl esters (LOVAZA) 1 g capsule Take 2 capsules (2 g total) by mouth daily. 03/02/17  Yes Pucilowska, Jolanta B, MD  omeprazole (PRILOSEC) 20 MG capsule Take 2 capsules (40 mg total) by  mouth daily. 03/02/17  Yes Pucilowska, Jolanta B, MD  oxyCODONE (OXY IR/ROXICODONE) 5 MG immediate release tablet Take 5 mg by mouth every 6 (six) hours as needed for severe pain.   Yes [provider]  venlafaxine XR (EFFEXOR-XR) 150 MG 24 hr capsule Take 2 capsules (300 mg total) by mouth daily with breakfast. 03/02/17  Yes Pucilowska, Jolanta B, MD  carbamazepine (TEGRETOL) 200 MG tablet Take 1 tablet (200 mg total) by mouth 2 (two) times daily. Patient not taking: Reported on 06/05/2017 03/02/17   Pucilowska, Braulio Conte B, MD  cholecalciferol (VITAMIN D) 1000 units tablet Take 5 tablets (5,000 Units total) by mouth daily. Patient not taking: Reported on 06/05/2017 03/02/17   Pucilowska, Ellin Goodie, MD  cyanocobalamin 1000 MCG tablet Take 1 tablet (1,000 mcg total) by mouth daily. Patient not taking: Reported on 06/05/2017 03/02/17   Pucilowska, Ellin Goodie, MD  folic acid (FOLVITE) 400 MCG tablet Take 1 tablet (400 mcg total) by mouth daily. Patient not taking: Reported on 06/05/2017 03/02/17   Pucilowska, Braulio Conte B, MD  metFORMIN (GLUCOPHAGE-XR) 500 MG 24 hr tablet Take 1 tablet (500 mg total) by mouth 2 (two) times daily. Patient not taking: Reported on 06/05/2017 03/02/17   Pucilowska, Braulio Conte B, MD  metoprolol tartrate (LOPRESSOR) 25 MG tablet Take 0.5 tablets (12.5 mg total) by mouth 2 (two) times daily. Patient not taking: Reported on 06/05/2017 03/02/17   Pucilowska, Ellin Goodie, MD  sucralfate (CARAFATE) 1 g tablet Take 1 tablet (1 g total) by mouth 4 (four) times daily -  with meals and at bedtime. Patient not taking: Reported on 06/05/2017 03/02/17   Shari Prows, MD    Allergies Codeine  Family History  Problem Relation Age of Onset  . Diabetes Mother   . Heart disease Mother   . Cancer Mother   . CAD Mother   . Heart disease Father   . CAD Father   . Diabetes Sister   . Emphysema Maternal Grandfather   . Diabetes Paternal Grandmother   . Cancer Paternal Grandfather      Social History Social History   Tobacco Use  . Smoking status: Never Smoker  . Smokeless tobacco: Never Used  Substance Use Topics  . Alcohol use: No  . Drug use: No    Review of Systems Constitutional: No fever/chills Eyes: No visual changes. ENT: No sore throat. Cardiovascular: Denies chest pain. Respiratory: Denies shortness of breath. Gastrointestinal: No abdominal pain.  Genitourinary: Negative for dysuria. Musculoskeletal: Negative for back pain. Skin: Negative for rash. Neurological: Negative for headaches or numbness.    ____________________________________________   PHYSICAL EXAM:  VITAL SIGNS: ED Triage Vitals [06/05/17 1425]  Enc Vitals Group     BP 116/81     Pulse Rate (!) 114     Resp 18     Temp 98.2 F (36.8 C)     Temp Source Oral  SpO2 100 %     Weight 140 lb (63.5 kg)     Height 5\' 8"  (1.727 m)     Head Circumference      Peak Flow      Pain Score      Pain Loc      Pain Edu?      Excl. in GC?     Constitutional: Alert and oriented.  Laying down, very flat in affect.  His wife is pleasant and supportive.  He appears relatively low in mood, but is pleasant and follows all commands. Eyes: Conjunctivae are normal. Head: Atraumatic. Nose: No congestion/rhinnorhea. Mouth/Throat: Mucous membranes are moist. Neck: No stridor.   Cardiovascular: Normal rate, regular rhythm. Grossly normal heart sounds.  Good peripheral circulation. Respiratory: Normal respiratory effort.  No retractions. Lungs CTAB. Gastrointestinal: Soft and nontender. No distention. Musculoskeletal: No lower extremity tenderness nor edema. Neurologic:  Normal speech and language. No gross focal neurologic deficits are appreciated.  Skin:  Skin is warm, dry and intact. No rash noted. Psychiatric: Mood and affect are very flat.  Denies any plans for suicide, denies ideas to want to harm anyone else or himself.  Denies  hallucinations. ____________________________________________   LABS (all labs ordered are listed, but only abnormal results are displayed)  Labs Reviewed  COMPREHENSIVE METABOLIC PANEL - Abnormal; Notable for the following components:      Result Value   Glucose, Bld 172 (*)    ALT 10 (*)    Total Bilirubin 1.9 (*)    All other components within normal limits  ACETAMINOPHEN LEVEL - Abnormal; Notable for the following components:   Acetaminophen (Tylenol), Serum <10 (*)    All other components within normal limits  CBC - Abnormal; Notable for the following components:   RBC 4.20 (*)    HCT 38.1 (*)    All other components within normal limits  ETHANOL  SALICYLATE LEVEL  URINE DRUG SCREEN, QUALITATIVE ([facility] ONLY)  CBG MONITORING, ED  CBG MONITORING, ED  CBG MONITORING, ED  CBG MONITORING, ED   ____________________________________________  EKG   ____________________________________________  RADIOLOGY   ____________________________________________   PROCEDURES  Procedure(s) performed: None  Procedures  Critical Care performed: No  ____________________________________________   INITIAL IMPRESSION / ASSESSMENT AND PLAN / ED COURSE  Pertinent labs & imaging results that were available during my care of the patient were reviewed by me and considered in my medical decision making (see chart for details).  Patient presents for symptoms of worsening of his possible depression.  He and his wife both would like for him to see a psychiatrist today, and I agree.  I do not see any evidence of an acute medical condition though his labs are presently pending.  He appears hemodynamically stable, alert and well-oriented.  He denies any symptoms of active suicidal ideation or symptoms that would lead me to think that he needs to be involuntarily committed at this time.   He is voluntary at present.  I have ordered a tele-psych consultation.     ----------------------------------------- 7:32 PM on 06/05/2017 -----------------------------------------  Patient will be placed under IVC for safety at the recommendation of psychiatry of recommended inpatient care.  Discussed with a psychiatrist, he will be calling the patient's wife to speak to her for further detail as well.  Patient medically cleared for further care and treatment under psychiatry.  Psychiatrist noted to me patient is noted to be on multiple different medications, he reports that he would continue current medications,  though there is some disagreement as to what exactly those medications are.  I have placed a consult to our TTS service as well as psychiatry, and have asked pharmacy to perform medical reconciliation to update patient's current medications so we may continue him on his current outpatient medicine.  Ongoing care assigned to Dr. Dolores FrameSung.  Patient under IVC, psychiatry recommending admission  ____________________________________________   FINAL CLINICAL IMPRESSION(S) / ED DIAGNOSES  Final diagnoses:  Severe episode of recurrent major depressive disorder, without psychotic features (HCC)      NEW MEDICATIONS STARTED DURING THIS VISIT:  New Prescriptions   No medications on file     Note:  This document was prepared using Dragon voice recognition software and may include unintentional dictation errors.     Sharyn CreamerQuale, Mark, MD 06/06/17 [phone]

## 2017-06-05 NOTE — ED Notes (Signed)
Pt. Advised of security cameras and 15 minute checks.  Pt. Advised to come to this nurse at nursing station or hit call bell if patient needs to talk or has any other concerns.

## 2017-06-05 NOTE — ED Triage Notes (Signed)
Pt came to ED via pov c/o depression. Pt is currently taking medications. Pt reports "I do not care if I live or die." Denies any drugs or drinking. Pt currently seeing a therpist

## 2017-06-05 NOTE — ED Notes (Signed)
Wife gave Alfredia ClientMary Jo, Patient Relations Rep a copy of her POA over the patient including health care POA. Copy placed on paper chart and one copy sent to medical records.

## 2017-06-06 LAB — GLUCOSE, CAPILLARY
Glucose-Capillary: 99 mg/dL (ref 65–99)
Glucose-Capillary: 133 mg/dL — ABNORMAL HIGH (ref 65–99)
Glucose-Capillary: 90 mg/dL (ref 65–99)
Glucose-Capillary: 112 mg/dL — ABNORMAL HIGH (ref 65–99)

## 2017-06-06 MED ORDER — OLANZAPINE 10 MG PO TABS
20.0000 mg | ORAL_TABLET | Freq: Every day | ORAL | Status: DC
  Administered 2017-06-06 (×2): 20 mg via ORAL
  Filled 2017-06-06 (×3): qty 2

## 2017-06-06 MED ORDER — METFORMIN HCL 500 MG PO TABS
1000.0000 mg | ORAL_TABLET | Freq: Two times a day (BID) | ORAL | Status: DC
  Administered 2017-06-06 – 2017-06-07 (×3): 1000 mg via ORAL
  Filled 2017-06-06 (×3): qty 2

## 2017-06-06 MED ORDER — ASPIRIN EC 81 MG PO TBEC
81.0000 mg | DELAYED_RELEASE_TABLET | Freq: Every day | ORAL | Status: DC
  Administered 2017-06-06 – 2017-06-07 (×2): 81 mg via ORAL
  Filled 2017-06-06 (×2): qty 1

## 2017-06-06 MED ORDER — GLIMEPIRIDE 1 MG PO TABS
1.0000 mg | ORAL_TABLET | Freq: Every day | ORAL | Status: DC
  Administered 2017-06-06 – 2017-06-07 (×2): 1 mg via ORAL
  Filled 2017-06-06 (×2): qty 1

## 2017-06-06 MED ORDER — OMEGA-3-ACID ETHYL ESTERS 1 G PO CAPS
2.0000 g | ORAL_CAPSULE | Freq: Every day | ORAL | Status: DC
  Administered 2017-06-06 – 2017-06-07 (×2): 2 g via ORAL
  Filled 2017-06-06 (×2): qty 2

## 2017-06-06 MED ORDER — ATORVASTATIN CALCIUM 20 MG PO TABS
20.0000 mg | ORAL_TABLET | Freq: Every day | ORAL | Status: DC
  Administered 2017-06-06: 20 mg via ORAL
  Filled 2017-06-06: qty 1

## 2017-06-06 MED ORDER — VENLAFAXINE HCL ER 150 MG PO CP24
300.0000 mg | ORAL_CAPSULE | Freq: Every day | ORAL | Status: DC
  Administered 2017-06-06 – 2017-06-07 (×2): 300 mg via ORAL
  Filled 2017-06-06: qty 4
  Filled 2017-06-06: qty 2

## 2017-06-06 MED ORDER — PANTOPRAZOLE SODIUM 40 MG PO TBEC
40.0000 mg | DELAYED_RELEASE_TABLET | Freq: Every day | ORAL | Status: DC
  Administered 2017-06-06 – 2017-06-07 (×2): 40 mg via ORAL
  Filled 2017-06-06 (×2): qty 1

## 2017-06-06 NOTE — ED Notes (Signed)
Pt given meal tray.

## 2017-06-06 NOTE — ED Provider Notes (Signed)
I was updated by behavioral medicine the patient was accepted to [facility] and I did complete his notes transfer paperwork.  Patient was moved from the behavioral holding unit to the main ER secondary to patient complaint about chronic dizziness which affects him almost all the time, and states that he had a workup with that with his primary care doctor.  He was moved here out of concern for increased fall risk, but no acute complaint.   Governor RooksLord, Lashan Gluth, MD 06/06/17 [phone]

## 2017-06-06 NOTE — ED Notes (Signed)

## 2017-06-06 NOTE — ED Notes (Signed)
Offered patient snack and drink. Patient denied food. Gave patient water to drink.AS

## 2017-06-06 NOTE — ED Notes (Signed)
Writer was assessing patient for possible admission to [facility].  [facility] RN questioned patients ability to ambulate.  Patient reports intermittent usage of a cane at home, however he stated, "Most of the time I can just grab the wall."  Writer then had patient ambulate in dayroom.  During ambulation patient became dizzy and had to physically be escorted back to room by Clinical research associatewriter.  Patient reports that his PCP has not been able to determine the cause of these episodes.

## 2017-06-06 NOTE — ED Notes (Signed)
Transportation for patient not available until Tuesday

## 2017-06-06 NOTE — ED Notes (Addendum)
Report called to Carroll SageBrenda Harris, [facility] at [facility].

## 2017-06-06 NOTE — BH Assessment (Signed)
Pt's wife called and reported the following:  "He's not the husband that I know. We have been married 30 years and his behaviors worry me. He has become more angry and aggressive. People have added medicines but no one is taking him off of anything. I'm not sure if he is having an adverse reaction to some of the medicines he is on. He lost 60 lbs. He was diagnosed with shingles (04/2016) at [facility] in the emergency room. Is he on too many medications? His attitude has changed . he started having an 'I don't care attitude' ... he is talking to me in ways he has never talked to me before ... he has always been a kind gentle person but now he just wants to lay in bed and not do anything. And these things have gotten me concerned."

## 2017-06-06 NOTE — ED Notes (Signed)
Talked to TSS, they will call pts wife to update on transfer.

## 2017-06-06 NOTE — ED Notes (Signed)
Patient received PM snack. 

## 2017-06-06 NOTE — ED Notes (Addendum)
Spoke with Press photographerCharge Nurse, Annette StableBill.  He states he is going to speak with the EDP concerning the patient.    Received call from StratfordLicia, Charity fundraiserN.  Patient will be transferred to room 21 in ED.

## 2017-06-06 NOTE — BH Assessment (Signed)
Referral information for Psychiatric Hospitalization faxed to;   Jeffrey Kitchen. [facility] [phone]),    [facility] [phone])  . [facility] ([phone])  . [facility] [phone]) [phone]  . [facility] [phone]  . [place]  . [facility] [phone]),   . [facility] [phone]),   . [facility] [phone]),   . [facility] [phone]).  [facility] Kitchen. [facility] [phone])

## 2017-06-06 NOTE — ED Notes (Signed)
Gave patient graham crackers, peanut butter, and lowfat milk.AS

## 2017-06-06 NOTE — ED Notes (Signed)
BEHAVIORAL HEALTH ROUNDING Patient sleeping: Yes.   Patient alert and oriented: not applicable SLEEPING Behavior appropriate: Yes.  ; If no, describe: SLEEPING Nutrition and fluids offered: No SLEEPING Toileting and hygiene offered: NoSLEEPING Sitter present: not applicable, Q 15 min safety rounds and observation. Law enforcement present: Yes ODS 

## 2017-06-06 NOTE — BH Assessment (Signed)
Called pt's wife to provide update. Left vm to call TTS back at [phone]

## 2017-06-06 NOTE — ED Provider Notes (Signed)
-----------------------------------------   7:14 AM on 06/06/2017 -----------------------------------------   Blood pressure 126/82, pulse 88, temperature 98 F (36.7 C), temperature source Oral, resp. rate 16, height 5\' 8"  (1.727 m), weight 63.5 kg (140 lb), SpO2 100 %.  The patient had no acute events since last update.  Calm and cooperative at this time. Disposition is pending Psychiatry/Behavioral Medicine team recommendations.     Irean HongSung, Jade J, MD 06/06/17 [phone]

## 2017-06-06 NOTE — BH Assessment (Signed)
Patient has been accepted to [facility].  [facility]. Accepting physician is Dr. Jearl KlinefelterLourdes Sanchez.  Call report to [phone].  Representative was CiscoChristina, Charity fundraiserN.  ER Staff is aware of it (ER Sect.; Dr. Shaune PollackLord, ER MD & Everardo PacificKenisha Patient's Nurse)

## 2017-06-06 NOTE — ED Notes (Signed)
BEHAVIORAL HEALTH ROUNDING  Patient sleeping: No.  Patient alert and oriented: yes  Behavior appropriate: Yes. ; If no, describe:  Nutrition and fluids offered: Yes  Toileting and hygiene offered: Yes  Sitter present: not applicable, Q 15 min safety rounds and observation.  Law enforcement present: Yes ODS  ED BHU PLACEMENT JUSTIFICATION  Is the patient under IVC or is there intent for IVC: Yes.  Is the patient medically cleared: Yes.  Is there vacancy in the ED BHU: Yes.  Is the population mix appropriate for patient: Yes.  Is the patient awaiting placement in inpatient or outpatient setting: Yes.  Has the patient had a psychiatric consult: Yes.  Survey of unit performed for contraband, proper placement and condition of furniture, tampering with fixtures in bathroom, shower, and each patient room: Yes. ; Findings: All clear  APPEARANCE/BEHAVIOR  calm, cooperative and adequate rapport can be established  NEURO ASSESSMENT  Orientation: time, place and person  Hallucinations: No.None noted (Hallucinations)  Speech: Normal  Gait: normal  RESPIRATORY ASSESSMENT  WNL  CARDIOVASCULAR ASSESSMENT  WNL  GASTROINTESTINAL ASSESSMENT  WNL  EXTREMITIES  WNL  PLAN OF CARE  Provide calm/safe environment. Vital signs assessed TID. ED BHU Assessment once each 12-hour shift. Collaborate with TTS daily or as condition indicates. Assure the ED provider has rounded once each shift. Provide and encourage hygiene. Provide redirection as needed. Assess for escalating behavior; address immediately and inform ED provider.  Assess family dynamic and appropriateness for visitation as needed: Yes. ; If necessary, describe findings:  Educate the patient/family about BHU procedures/visitation: Yes. ; If necessary, describe findings: Pt is calm and cooperative at this time. Pt understanding and accepting of unit procedures/rules. Will continue to monitor with Q 15 min safety rounds and observation.    

## 2017-06-07 ENCOUNTER — Telehealth: Payer: Self-pay | Admitting: Emergency Medicine

## 2017-06-07 ENCOUNTER — Inpatient Hospital Stay (HOSPITAL_COMMUNITY)
Admission: AD | Admit: 2017-06-07 | Discharge: 2017-06-13 | DRG: 885 | Disposition: A | Discharge status: Home / Self Care | Payer: 59 | Source: Intra-hospital | Attending: Psychiatry | Admitting: Psychiatry

## 2017-06-07 ENCOUNTER — Encounter (HOSPITAL_COMMUNITY): Payer: Self-pay | Admitting: *Deleted

## 2017-06-07 ENCOUNTER — Other Ambulatory Visit: Payer: Self-pay

## 2017-06-07 DIAGNOSIS — F5105 Insomnia due to other mental disorder: Secondary | ICD-10-CM | POA: Diagnosis present

## 2017-06-07 DIAGNOSIS — F329 Major depressive disorder, single episode, unspecified: Secondary | ICD-10-CM | POA: Diagnosis present

## 2017-06-07 DIAGNOSIS — R45851 Suicidal ideations: Secondary | ICD-10-CM | POA: Diagnosis present

## 2017-06-07 DIAGNOSIS — Z7984 Long term (current) use of oral hypoglycemic drugs: Secondary | ICD-10-CM | POA: Diagnosis not present

## 2017-06-07 DIAGNOSIS — F39 Unspecified mood [affective] disorder: Secondary | ICD-10-CM | POA: Diagnosis not present

## 2017-06-07 DIAGNOSIS — Z560 Unemployment, unspecified: Secondary | ICD-10-CM | POA: Diagnosis not present

## 2017-06-07 DIAGNOSIS — K219 Gastro-esophageal reflux disease without esophagitis: Secondary | ICD-10-CM | POA: Diagnosis present

## 2017-06-07 DIAGNOSIS — Z811 Family history of alcohol abuse and dependence: Secondary | ICD-10-CM | POA: Diagnosis not present

## 2017-06-07 DIAGNOSIS — F333 Major depressive disorder, recurrent, severe with psychotic symptoms: Principal | ICD-10-CM | POA: Diagnosis present

## 2017-06-07 DIAGNOSIS — Z6822 Body mass index (BMI) 22.0-22.9, adult: Secondary | ICD-10-CM | POA: Diagnosis not present

## 2017-06-07 DIAGNOSIS — H8110 Benign paroxysmal vertigo, unspecified ear: Secondary | ICD-10-CM | POA: Diagnosis present

## 2017-06-07 DIAGNOSIS — Z79899 Other long term (current) drug therapy: Secondary | ICD-10-CM

## 2017-06-07 DIAGNOSIS — F419 Anxiety disorder, unspecified: Secondary | ICD-10-CM | POA: Diagnosis present

## 2017-06-07 DIAGNOSIS — R634 Abnormal weight loss: Secondary | ICD-10-CM | POA: Diagnosis present

## 2017-06-07 DIAGNOSIS — Z7982 Long term (current) use of aspirin: Secondary | ICD-10-CM

## 2017-06-07 DIAGNOSIS — E119 Type 2 diabetes mellitus without complications: Secondary | ICD-10-CM | POA: Diagnosis present

## 2017-06-07 DIAGNOSIS — E785 Hyperlipidemia, unspecified: Secondary | ICD-10-CM | POA: Diagnosis present

## 2017-06-07 DIAGNOSIS — Z818 Family history of other mental and behavioral disorders: Secondary | ICD-10-CM | POA: Diagnosis not present

## 2017-06-07 DIAGNOSIS — F332 Major depressive disorder, recurrent severe without psychotic features: Secondary | ICD-10-CM | POA: Diagnosis not present

## 2017-06-07 DIAGNOSIS — I10 Essential (primary) hypertension: Secondary | ICD-10-CM | POA: Diagnosis present

## 2017-06-07 DIAGNOSIS — F322 Major depressive disorder, single episode, severe without psychotic features: Secondary | ICD-10-CM | POA: Diagnosis not present

## 2017-06-07 DIAGNOSIS — G4700 Insomnia, unspecified: Secondary | ICD-10-CM | POA: Diagnosis present

## 2017-06-07 MED ORDER — TRAZODONE HCL 50 MG PO TABS: 50.0000 mg | ORAL_TABLET | Freq: Every evening | ORAL | Status: DC | PRN

## 2017-06-07 MED ORDER — MAGNESIUM HYDROXIDE 400 MG/5ML PO SUSP: 30.0000 mL | Freq: Every day | ORAL | Status: DC | PRN

## 2017-06-07 MED ORDER — ALUM & MAG HYDROXIDE-SIMETH 200-200-20 MG/5ML PO SUSP
30.0000 mL | ORAL | Status: DC | PRN
  Administered 2017-06-11 (×2): 30 mL via ORAL
  Filled 2017-06-07 (×2): qty 30

## 2017-06-07 MED ORDER — ACETAMINOPHEN 325 MG PO TABS: 650.0000 mg | ORAL_TABLET | Freq: Four times a day (QID) | ORAL | Status: DC | PRN

## 2017-06-07 MED ORDER — ENSURE ENLIVE PO LIQD
237.0000 mL | Freq: Two times a day (BID) | ORAL | Status: DC
  Administered 2017-06-07 – 2017-06-11 (×3): 237 mL via ORAL

## 2017-06-07 MED ORDER — HYDROXYZINE HCL 25 MG PO TABS: 25.0000 mg | ORAL_TABLET | Freq: Three times a day (TID) | ORAL | Status: DC | PRN

## 2017-06-07 NOTE — Progress Notes (Signed)
Patient verbalized that he had a bad day since he began his day initially as a patient at [facility]. He would like to go to a hospital closer to home. As for the theme of the day, his wellness strategy will include working on organizing things in his life.

## 2017-06-07 NOTE — Progress Notes (Signed)
       []  Hide copied text  [] Hover for details   [facility] Notes:  (Nursing/MHT/Case Management/Adjunct)  Date:  06/07/2017  Time:  1600  Type of Therapy:  Psychoeducational Skills  Participation Level:  Did Not Attend

## 2017-06-07 NOTE — ED Notes (Signed)
No e-signature due to patient's psych status.

## 2017-06-07 NOTE — Progress Notes (Signed)
Nutrition Brief Note  Patient identified on the Malnutrition Screening Tool (MST) Report  Wt Readings from Last 15 Encounters:  06/07/17 147 lb (66.7 kg)  06/05/17 140 lb (63.5 kg)  03/11/17 154 lb (69.9 kg)  02/20/17 138 lb (62.6 kg)  02/19/17 142 lb (64.4 kg)  11/19/16 142 lb (64.4 kg)  11/06/16 147 lb (66.7 kg)  10/30/16 147 lb 4.8 oz (66.8 kg)  10/15/16 152 lb (68.9 kg)  10/07/16 152 lb (68.9 kg)  04/23/16 172 lb (78 kg)  04/23/16 172 lb (78 kg)  04/20/16 177 lb (80.3 kg)    Body mass index is 22.35 kg/m. Patient meets criteria for normal weight based on current BMI. Pt admitted for depression and passive thoughts of harming himself. He has been feeling hopeless and helpless. He reports that he lost 60 lbs over the past 4 months, but this is inconsistent with weight hx in the chart.   Current diet order is Regular and patient is eating as desired for meals and snacks. Labs and medications reviewed.   No nutrition interventions warranted at this time. If nutrition issues arise, please consult RD.      Trenton GammonJessica Darrie Macmillan, MS, RD, LDN, [facility] Inpatient Clinical Dietitian Pager # [phone] After hours/weekend pager # [phone]

## 2017-06-07 NOTE — ED Notes (Signed)
[facility]  TRANSPORTATION CALLED  TO  TRANSPORT  PT  TO  [facility]

## 2017-06-07 NOTE — BH Assessment (Signed)
Patient has been accepted to [name].  Patient assigned to room 406-1 Accepting physician is Dr.Cobos.  Call report to [phone]. -Report must be called BEFORE transport.  Representative was [place].  ER Staff is aware of it Misty Stanley(Lisa, ER Sect.; Dr. Darnelle CatalanMalinda, ER MD & Orpha BurKaty, Patient's Nurse)    Patient's Family/Support System [place](Ladania Maynes-wife: [phone]) have been updated as well.  This Clinical research associatewriter contacted [facility] and spoke with Wilkie AyeKristy to inform patient has [facility].

## 2017-06-07 NOTE — ED Notes (Signed)
BEHAVIORAL HEALTH ROUNDING Patient sleeping: Yes.   Patient alert and oriented: not applicable SLEEPING Behavior appropriate: Yes.  ; If no, describe: SLEEPING Nutrition and fluids offered: No SLEEPING Toileting and hygiene offered: NoSLEEPING Sitter present: not applicable, Q 15 min safety rounds and observation. Law enforcement present: Yes ODS 

## 2017-06-07 NOTE — Tx Team (Signed)
Initial Treatment Plan 06/07/2017 1:43 PM Jeffrey CreekJohn David Velazquez ZOX:[id number]    PATIENT STRESSORS: Financial difficulties Health problems Occupational concerns Traumatic event   PATIENT STRENGTHS: Average or above average intelligence Capable of independent living Communication skills Motivation for treatment/growth Supportive family/friends Work skills   PATIENT IDENTIFIED PROBLEMS: Health crisis  "I lost my job due to having shingles."   "I don't care anymore"  Loss of home and employment  Conflict at home with foster children  Suicide risk  Depression         DISCHARGE CRITERIA:  Improved stabilization in mood, thinking, and/or behavior Motivation to continue treatment in a less acute level of care Reduction of life-threatening or endangering symptoms to within safe limits Verbal commitment to aftercare and medication compliance  PRELIMINARY DISCHARGE PLAN: Outpatient therapy Return to previous living arrangement  PATIENT/FAMILY INVOLVEMENT: This treatment plan has been presented to and reviewed with the patient, Jeffrey CreekJohn David Olson.  The patient and family have been given the opportunity to ask questions and make suggestions.  Cranford MonBeaudry, Adele Milson Evans, RN 06/07/2017, 1:43 PM

## 2017-06-07 NOTE — ED Notes (Signed)
Pt ate all breakfast tray; banana and biscuti and juice 100%

## 2017-06-07 NOTE — ED Notes (Signed)
EMTALA reviewed by charge RN 

## 2017-06-07 NOTE — ED Provider Notes (Signed)
-----------------------------------------   7:08 AM on 06/07/2017 -----------------------------------------   Blood pressure 136/74, pulse 89, temperature 98.2 F (36.8 C), temperature source Oral, resp. rate 16, height 5\' 8"  (1.727 m), weight 63.5 kg (140 lb), SpO2 99 %.  The patient had no acute events since last update.  Calm and cooperative at this time. [facility] or tomorrow depending on transportation.    Irean HongSung, Jaslyne Beeck J, MD 06/07/17 [phone]

## 2017-06-07 NOTE — ED Notes (Signed)
[facility]C  PAPERS  RESCINDED PER  DR  Darnelle CatalanMALINDA  MD  INFORMED  KATIE  RN  AND  HEATHER RN CHARGE NURSE

## 2017-06-07 NOTE — Progress Notes (Addendum)
Patient's wife has been continuously calling [facility] regarding his treatment.  She was informed by Florentina AddisonKatie, RN at [facility] that she could not have any information as she did not have a code number.  This patient was admitted earlier today and was told to contact his wife with the code number.  Patient's wife is currently in lobby and she states "I am really upset."  Explained to patient that we have to protect patient's confidentiality.  Gave Ms. Gavin PottersKernodle the code number with patient's permission.  Ms. Gavin PottersKernodle states that her husband "has not been right for the last two years.  He has been going to several different provider and obtaining many medications from each."  She states that she "has screamed and ranted at the doctors about his medications and they don't listen."  Ms. Gavin PottersKernodle gave this staff member a copy of his Healthcare Power of Attorney to be placed on his chart.  She was informed that this did not cover a Mental Health Directive.  She was persistent with staff and stated, "I know what I'm talking about.  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  She informed staff member that she "will just camp out in the lobby until visiting hours."  I informed her I would be available at that time if she had more questions.  I also informed her that I would let the [facility] know that she was here and had provided a copy of his Healthcare Power of [id number] D[address].  A copy of this form has been placed on his shadow chart.  Spoke with Dr. Emeline DarlingIze because patient arrived before 1200.  Was informed that patient will be seen tomorrow.  Patient has basic admit orders for tonight.

## 2017-06-07 NOTE — Progress Notes (Addendum)
  DATA ACTION RESPONSE  Objective- Pt. is visible in the room, seen laying in bed with eyes open. Presents with a depressed affect and mood. Pt was guarded/minimal on approach. Pt states he would like to speak to MD regarding restarting medications.     Subjective- Denies having any SI/HI/AVH/Pain at this time.Is cooperative and remains safe on the unit.  1:1 interaction in private to establish rapport. Encouragement, education, & support given from staff.     Safety maintained with Q 15 checks. Continue with POC.

## 2017-06-07 NOTE — ED Provider Notes (Addendum)
patient now going to [facility]. He has consented to voluntary treatment so we will discontinue the ointment this will enable transport as well.   Arnaldo NatalMalinda, Paul F, MD 06/07/17 [phone]    Arnaldo NatalMalinda, Paul F, MD 06/07/17 [phone]

## 2017-06-07 NOTE — ED Notes (Signed)
BEHAVIORAL HEALTH ROUNDING  Patient sleeping: No.  Patient alert and oriented: yes  Behavior appropriate: Yes. ; If no, describe:  Nutrition and fluids offered: Yes  Toileting and hygiene offered: Yes  Sitter present: not applicable, Q 15 min safety rounds and observation.  Law enforcement present: Yes ODS  
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
## 2017-06-07 NOTE — ED Notes (Signed)
[facility].

## 2017-06-08 DIAGNOSIS — F322 Major depressive disorder, single episode, severe without psychotic features: Secondary | ICD-10-CM

## 2017-06-08 DIAGNOSIS — Z811 Family history of alcohol abuse and dependence: Secondary | ICD-10-CM

## 2017-06-08 DIAGNOSIS — Z818 Family history of other mental and behavioral disorders: Secondary | ICD-10-CM

## 2017-06-08 DIAGNOSIS — R45851 Suicidal ideations: Secondary | ICD-10-CM

## 2017-06-08 MED ORDER — METFORMIN HCL 500 MG PO TABS
1000.0000 mg | ORAL_TABLET | Freq: Two times a day (BID) | ORAL | Status: DC
  Administered 2017-06-08 – 2017-06-13 (×10): 1000 mg via ORAL
  Filled 2017-06-08 (×14): qty 2

## 2017-06-08 MED ORDER — MIRTAZAPINE 7.5 MG PO TABS
7.5000 mg | ORAL_TABLET | Freq: Every day | ORAL | Status: DC
  Administered 2017-06-08 – 2017-06-09 (×2): 7.5 mg via ORAL
  Filled 2017-06-08 (×3): qty 1

## 2017-06-08 MED ORDER — ASPIRIN EC 81 MG PO TBEC
81.0000 mg | DELAYED_RELEASE_TABLET | Freq: Every day | ORAL | Status: DC
  Administered 2017-06-08 – 2017-06-13 (×6): 81 mg via ORAL
  Filled 2017-06-08 (×8): qty 1

## 2017-06-08 MED ORDER — VENLAFAXINE HCL ER 150 MG PO CP24
150.0000 mg | ORAL_CAPSULE | Freq: Every day | ORAL | Status: DC
  Administered 2017-06-09 – 2017-06-10 (×2): 150 mg via ORAL
  Filled 2017-06-08 (×3): qty 1

## 2017-06-08 MED ORDER — GABAPENTIN 300 MG PO CAPS
300.0000 mg | ORAL_CAPSULE | Freq: Three times a day (TID) | ORAL | Status: DC
Start: 2017-06-08 — End: 2017-06-10
  Administered 2017-06-08 – 2017-06-10 (×6): 300 mg via ORAL
  Filled 2017-06-08 (×9): qty 1

## 2017-06-08 NOTE — [facility] Group Notes (Signed)
Pt attended group activity on mindfulness. Pt was alert and engaged in group exercise. 
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
## 2017-06-08 NOTE — H&P (Signed)
Psychiatric Admission Assessment Adult  Patient Identification: Jeffrey Velazquez MRN:  [id number] Date of Evaluation:  06/08/2017 Chief Complaint:  " depression" Principal Diagnosis:  MDD, Severe, no Psychotic Features  Diagnosis:   Patient Active Problem List   Diagnosis Date Noted  . MDD (major depressive disorder), severe (HCC) [F32.2] 06/07/2017  . Diabetes (HCC) [E11.9] 02/21/2017  . HTN (hypertension) [I10] 02/21/2017  . GERD (gastroesophageal reflux disease) [K21.9] 02/21/2017  . Vitamin B12 deficiency [E53.8] 02/21/2017  . Severe episode of recurrent major depressive disorder, with psychotic features (HCC) [F33.3] 02/20/2017  . 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 Chest pain [R07.9] 10/29/2016  . Sinus tachycardia [R00.0] 10/29/2016  . Hyponatremia [E87.1] 10/29/2016  . Hypokalemia [E87.6] 10/29/2016   History of Present Illness: 57 year old male, presented to ED with wife , reporting worsening depression, passive suicidal ideations, reporting he did not care to live anymore. Patient states he has chronic depression, which he characterizes as intermittent but generally worsening over recent weeks .  He has been facing significant stressors/losses, including loss of job ,home , due to medical issues               (states he had two episodes of Shingles/Herpes Zoster, and small intestine inflammation ) . Explains that due to medical issues he went on FMLA ,which ran out in December and that his position was terminated . Reports neuro-vegetative symptoms of depression as below, including significant loss of appetite and weight loss .  Denies drug or alcohol abuse and admission BAL was negative, admission UDS negative  Patient states that he had stopped prescribed medications - see below- for several weeks due to side effects and concerns about possible serotonin syndrome, but restarted a few weeks ago. States he does not think current medications are helping, and states " I  feel like I am going downhill regardless of taking the medications".  Associated Signs/Symptoms: Depression Symptoms:  depressed mood, anhedonia, insomnia, suicidal thoughts without plan, loss of energy/fatigue, decreased appetite, (Hypo) Manic Symptoms:  Denies  Anxiety Symptoms:  Does not endorse  Psychotic Symptoms: denies  PTSD Symptoms: Does not endorse  Total Time spent with patient: 45 minutes  Past Psychiatric History:reports history of depression, which started about 1-2 years ago. States his depression worsened due to medical illness ( Shingles, GI symptoms) .  History of prior psychiatric admission at [facility] in 2018 for depression.  Denies history of psychotic symptoms, denies history of suicide attempts, denies history of self cutting , denies history of violence . Denies history of panic or agoraphobia.  Is the patient at risk to self? Yes.    Has the patient been a risk to self in the past 6 months? No.  Has the patient been a risk to self within the distant past? No.  Is the patient a risk to others? No.  Has the patient been a risk to others in the past 6 months? No.  Has the patient been a risk to others within the distant past? No.   Prior Inpatient Therapy:  one prior psychiatric admission in 2018, as above  Prior Outpatient Therapy:  states he is currently not seeing an outpatient psychiatrist .  Alcohol Screening: 1. How often do you have a drink containing alcohol?: Never 2. How many drinks containing alcohol do you have on a typical day when you are drinking?: 1 or 2 3. How often do you have six or more drinks on one occasion?: Never AUDIT-C Score:  0 9. Have you or someone else been injured as a result of your drinking?: No 10. Has a relative or friend or a doctor or another health worker been concerned about your drinking or suggested you cut down?: No Alcohol Use Disorder Identification Test Final Score (AUDIT): 0 Intervention/Follow-up: AUDIT Score <7  follow-up not indicated Substance Abuse History in the last 12 months:  Denies drug or alcohol abuse  Consequences of Substance Abuse: Denies  Previous Psychotropic Medications: after prior admission in October 2018 was discharged on Effexor XR 300 mgrs daily, Zyprexa 20 mgrs QHS, Melatonin. He was also discharged on Carbamazepine and on Neurontin for management of neuropathic pain.  He has been on Cymbalta in the past .  Psychological Evaluations:  No  Past Medical History:  Past Medical History:  Diagnosis Date  . Anemia   . Anxiety   . Depression   . Diabetes mellitus without complication (HCC)   . DOE (dyspnea on exertion)   . Early satiety   . Epigastric abdominal pain   . GERD (gastroesophageal reflux disease)   . Hyperlipidemia   . Irritable bowel syndrome   . Shingles   . Tachycardia   . Unintended weight loss     Past Surgical History:  Procedure Laterality Date  . COLONOSCOPY WITH PROPOFOL N/A 11/19/2016   Procedure: COLONOSCOPY WITH PROPOFOL;  Surgeon: Scot Jun, MD;  Location: [facility] ENDOSCOPY;  Service: Endoscopy;  Laterality: N/A;  . ESOPHAGOGASTRODUODENOSCOPY (EGD) WITH PROPOFOL N/A 11/06/2016   Procedure: ESOPHAGOGASTRODUODENOSCOPY (EGD) WITH PROPOFOL;  Surgeon: Scot Jun, MD;  Location: [facility] ENDOSCOPY;  Service: Endoscopy;  Laterality: N/A;  . FRACTURE SURGERY Left 2010   Clavicle  . TONSILLECTOMY    . TOOTH EXTRACTION     wisdom teeth x 4   Family History:  Father passed away from complications of a fall, mother died from complications of COPD, has 1 brother and 3 sisters  Family History  Problem Relation Age of Onset  . Diabetes Mother   . Heart disease Mother   . Cancer Mother   . CAD Mother   . Heart disease Father   . CAD Father   . Diabetes Sister   . Emphysema Maternal Grandfather   . Diabetes Paternal Grandmother   . Cancer Paternal Grandfather    Family Psychiatric  History: sister has history of depression, grandfather was  alcoholic, no suicides in family Tobacco Screening: Have you used any form of tobacco in the last 30 days? (Cigarettes, Smokeless Tobacco, Cigars, and/or Pipes): No Social History: married , unemployed, no current source of income , lives with wife , has two adult biological sons, and two adopted children ( 18, 16). Youngest adopted daughter was having behavioral issues and currently lives with another family.  Social History   Substance and Sexual Activity  Alcohol Use No     Social History   Substance and Sexual Activity  Drug Use No    Additional Social History:  Allergies:   Allergies  Allergen Reactions  . Codeine Nausea Only and Other (See Comments)    dizziness  . Trazodone And Nefazodone     Pt states "I can't walk".    Lab Results:  Results for orders placed or performed during the hospital encounter of 06/05/17 (from the past 48 hour(s))  Glucose, capillary     Status: Abnormal   Collection Time: 06/06/17 12:00 PM  Result Value Ref Range   Glucose-Capillary 133 (H) 65 - 99 mg/dL   Comment  1 Notify RN   Glucose, capillary     Status: None   Collection Time: 06/06/17  4:49 PM  Result Value Ref Range   Glucose-Capillary 90 65 - 99 mg/dL   Comment 1 Notify RN   Glucose, capillary     Status: Abnormal   Collection Time: 06/06/17 11:11 PM  Result Value Ref Range   Glucose-Capillary 112 (H) 65 - 99 mg/dL    Blood Alcohol level:  Lab Results  Component Value Date   ETH <10 06/05/2017    Metabolic Disorder Labs:  Lab Results  Component Value Date   HGBA1C 6.5 (H) 02/20/2017   MPG 139.85 02/20/2017   MPG 148 10/29/2016   No results found for: PROLACTIN Lab Results  Component Value Date   CHOL 158 02/20/2017   TRIG 215 (H) 02/20/2017   HDL 45 02/20/2017   CHOLHDL 3.5 02/20/2017   VLDL 43 (H) 02/20/2017   LDLCALC 70 02/20/2017    Current Medications: Current Facility-Administered Medications  Medication Dose Route Frequency Provider Last Rate Last Dose   . acetaminophen (TYLENOL) tablet 650 mg  650 mg Oral Q6H PRN Money, Gerlene Burdockravis B, FNP      . alum & mag hydroxide-simeth (MAALOX/MYLANTA) 200-200-20 MG/5ML suspension 30 mL  30 mL Oral Q4H PRN Money, Gerlene Burdockravis B, FNP      . feeding supplement (ENSURE ENLIVE) (ENSURE ENLIVE) liquid 237 mL  237 mL Oral BID BM Money, Gerlene Burdockravis B, FNP   237 mL at 06/07/17 1724  . hydrOXYzine (ATARAX/VISTARIL) tablet 25 mg  25 mg Oral TID PRN Money, Gerlene Burdockravis B, FNP      . magnesium hydroxide (MILK OF MAGNESIA) suspension 30 mL  30 mL Oral Daily PRN Money, Gerlene Burdockravis B, FNP      . traZODone (DESYREL) tablet 50 mg  50 mg Oral QHS PRN Money, Gerlene Burdockravis B, FNP       PTA Medications: Medications Prior to Admission  Medication Sig Dispense Refill Last Dose  . aspirin EC 81 MG tablet Take 1 tablet (81 mg total) by mouth daily. 30 tablet 1 06/04/2017 at AM  . atorvastatin (LIPITOR) 20 MG tablet Take 1 tablet (20 mg total) by mouth daily. 30 tablet 1 06/04/2017 at PM  . carbamazepine (TEGRETOL) 200 MG tablet Take 1 tablet (200 mg total) by mouth 2 (two) times daily. (Patient not taking: Reported on 06/05/2017) 60 tablet 3 -- at --  . cholecalciferol (VITAMIN D) 1000 units tablet Take 5 tablets (5,000 Units total) by mouth daily. (Patient not taking: Reported on 06/05/2017) 150 tablet 1 -- at --  . cyanocobalamin 1000 MCG tablet Take 1 tablet (1,000 mcg total) by mouth daily. (Patient not taking: Reported on 06/05/2017) 30 tablet 1 -- at --  . folic acid (FOLVITE) 400 MCG tablet Take 1 tablet (400 mcg total) by mouth daily. (Patient not taking: Reported on 06/05/2017) 30 tablet 1 -- at --  . gabapentin (NEURONTIN) 300 MG capsule Take 2 capsules (600 mg total) by mouth 3 (three) times daily. (Patient taking differently: Take 600-900 mg by mouth 3 (three) times daily. Take 2 capsules by mouth every morning, 2 every day at lunch and 3 capsules by mouth at bedtime) 180 capsule 1 06/04/2017 at PM  . glimepiride (AMARYL) 2 MG tablet Take 1 tablet (2 mg total)  by mouth 2 (two) times daily. 60 tablet 1 06/04/2017 at PM  . Lancets 30G MISC by Does not apply route.   UTD at UTD  . lidocaine (LIDODERM) 5 %  Place 1 patch onto the skin daily. Remove & Discard patch within 12 hours or as directed by MD 30 patch 1 PRN at PRN  . metFORMIN (GLUCOPHAGE) 1000 MG tablet Take 1,000 mg by mouth 2 (two) times daily with a meal.   06/05/2017 at AM  . metFORMIN (GLUCOPHAGE-XR) 500 MG 24 hr tablet Take 1 tablet (500 mg total) by mouth 2 (two) times daily. (Patient not taking: Reported on 06/05/2017) 60 tablet 1 -- at --  . metoprolol tartrate (LOPRESSOR) 25 MG tablet Take 0.5 tablets (12.5 mg total) by mouth 2 (two) times daily. (Patient not taking: Reported on 06/05/2017) 15 tablet 1 -- at --  . OLANZapine (ZYPREXA) 20 MG tablet Take 1 tablet (20 mg total) by mouth at bedtime. 30 tablet 1 06/04/2017 at HS  . omega-3 acid ethyl esters (LOVAZA) 1 g capsule Take 2 capsules (2 g total) by mouth daily. 60 capsule 1 06/04/2017 at AM  . omeprazole (PRILOSEC) 20 MG capsule Take 2 capsules (40 mg total) by mouth daily. 60 capsule 1 06/04/2017 at AM  . oxyCODONE (OXY IR/ROXICODONE) 5 MG immediate release tablet Take 5 mg by mouth every 6 (six) hours as needed for severe pain.   PRN at PRN  . sucralfate (CARAFATE) 1 g tablet Take 1 tablet (1 g total) by mouth 4 (four) times daily -  with meals and at bedtime. (Patient not taking: Reported on 06/05/2017) 120 tablet 1 -- at --  . venlafaxine XR (EFFEXOR-XR) 150 MG 24 hr capsule Take 2 capsules (300 mg total) by mouth daily with breakfast. 60 capsule 1 06/04/2017 at AM    Musculoskeletal: Strength & Muscle Tone: within normal limits Gait & Station: normal Patient leans: N/A  Psychiatric Specialty Exam: Physical Exam  Review of Systems  Constitutional: Positive for weight loss.  HENT: Positive for tinnitus.        Reports intermittent tinnitus   Eyes: Negative.   Respiratory: Negative.   Cardiovascular: Negative.   Gastrointestinal:  Negative for blood in stool, nausea and vomiting.  Genitourinary: Negative.   Musculoskeletal: Negative.   Skin: Negative.   Neurological: Negative for seizures.  Endo/Heme/Allergies: Negative.   Psychiatric/Behavioral: Positive for depression and suicidal ideas.  All other systems reviewed and are negative.   Blood pressure 110/72, pulse (!) 105, temperature 97.7 F (36.5 C), temperature source Oral, resp. rate 16, height 5\' 8"  (1.727 m), weight 66.7 kg (147 lb).Body mass index is 22.35 kg/m.  General Appearance: Fairly Groomed  Eye Contact:  Fair  Speech:  Normal Rate  Volume:  Decreased  Mood:  Depressed  Affect:  constricted, sad  Thought Process:  Linear and Descriptions of Associations: Intact  Orientation:  Other:  fully alert and attentive  Thought Content:  denies hallucinations, no delusions, not internally preoccupied   Suicidal Thoughts:  No denies any current suicidal or self injurious ideations, contracts for safety on unit   Homicidal Thoughts:  No  Memory:  recent and remote grossly intact   Judgement:  Fair  Insight:  Fair  Psychomotor Activity:  Normal- no tremors or psychomotor agitation   Concentration:  Concentration: Good and Attention Span: Good  Recall:  Good  Fund of Knowledge:  Good  Language:  Good  Akathisia:  Negative  Handed:  Right  AIMS (if indicated):     Assets:  Communication Skills Desire for Improvement Resilience  ADL's:  Intact  Cognition:  WNL  Sleep:  Number of Hours: 6.5    Treatment Plan  Summary: Daily contact with patient to assess and evaluate symptoms and progress in treatment, Medication management, Plan inpatient admission and medications as below  Observation Level/Precautions:  15 minute checks  Laboratory:  as below   Psychotherapy: milieu, group therapy    Medications:  We discussed options-  patient is unsure Effexor XR is helping so will taper down ( rather than D/C due to risk of WDL symptoms). Effexor XR 150  mgrs QDAY  Agrees to Remeron trial as antidepressant augmentation, and for help with insomnia and with appetite  Start Remeron 7.5 mgrs QHS No clear indication for Zyprexa at this time- no psychotic symptoms, no history of bipolarity reported . States it is not helping . Will D/C  D/C  Zyprexa  Currently taking Neurontin at 300 mgrs BID. States he stopped Carbamazepine because it was not helping . Increase Neurontin to 300 mgrs TID.   Consultations: as needed  Discharge Concerns:  -   Estimated LOS: 5 days   Other:     Physician Treatment Plan for Primary Diagnosis: MDD, severe, no psychotic features  Long Term Goal(s): Improvement in symptoms so as ready for discharge  Short Term Goals: Ability to identify changes in lifestyle to reduce recurrence of condition will improve and Ability to maintain clinical measurements within normal limits will improve  Physician Treatment Plan for Secondary Diagnosis: Active Problems:   MDD (major depressive disorder), severe (HCC)  Long Term Goal(s): Improvement in symptoms so as ready for discharge  Short Term Goals: Ability to verbalize feelings will improve, Ability to disclose and discuss suicidal ideas, Ability to demonstrate self-control will improve, Ability to identify and develop effective coping behaviors will improve and Ability to maintain clinical measurements within normal limits will improve  I certify that inpatient services furnished can reasonably be expected to improve the patient's condition.    Craige Cotta, MD 1/22/20199:48 AM

## 2017-06-08 NOTE — Progress Notes (Signed)
Patient denies SI, HI and AVH.  Patient has attended groups engaged in unit activities and been in no behavioral dyscontrol.   Assess patient for safety, offer medications as prescribed, and engage patient in 1:1 staff talks.   Patient able to contract for safety, continue to monitor as prescribed.  

## 2017-06-08 NOTE — Progress Notes (Signed)
Recreation Therapy Notes  Animal-Assisted Activity (AAA) Program Checklist/Progress Notes Patient Eligibility Criteria Checklist & Daily Group note for Rec TxIntervention  Date: 01.22.2019 Time: 2:45pm [address]   AAA/T Program Assumption of Risk Form signed by Patient/ or Parent Legal Guardian Yes  Patient is free of allergies or sever asthma Yes  Patient reports no fear of animals Yes  Patient reports no history of cruelty to animals Yes  Patient understands his/her participation is voluntary Yes  Patient washes hands before animal contact Yes  Patient washes hands after animal contact Yes  Behavioral Response: Engaged, Attentive   Education:Hand Washing, Appropriate Animal Interaction   Education Outcome: Acknowledges education.   Clinical Observations/Feedback: Patient attended session and interacted appropriately with therapy dog and peers.   Marykay Lexenise L Samul Mcinroy, LRT/CTRS       Jearl KlinefelterBlanchfield, Shreyansh Tiffany L 06/08/2017 4:04 PM

## 2017-06-08 NOTE — [facility] Suicide Risk Assessment (Signed)
[facility]   Nursing information obtained from:    patient and chart  Demographic factors:   [age] year old married male, currently unemployed  Current Mental Status:   see below Loss Factors:   loss of job related to medical illness  Historical Factors:   depression Risk Reduction Factors:   resilience   Total Time spent with patient: 45 minutes Principal Problem:  MDD, no psychotic symptoms Diagnosis:   Patient Active Problem List   Diagnosis Date Noted  . MDD (major depressive disorder), severe (HCC) [F32.2] 06/07/2017  . Diabetes (HCC) [E11.9] 02/21/2017  . HTN (hypertension) [I10] 02/21/2017  . GERD (gastroesophageal reflux disease) [K21.9] 02/21/2017  . Vitamin B12 deficiency [E53.8] 02/21/2017  . Severe episode of recurrent major depressive disorder, with psychotic features (HCC) [F33.3] 02/20/2017  . 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 Chest pain [R07.9] 10/29/2016  . Sinus tachycardia [R00.0] 10/29/2016  . Hyponatremia [E87.1] 10/29/2016  . Hypokalemia [E87.6] 10/29/2016    Continued Clinical Symptoms:  Alcohol Use Disorder Identification Test Final Score (AUDIT): 0 The "Alcohol Use Disorders Identification Test", Guidelines for Use in Primary Care, Second Edition.  World [employer] [facility](WHO). Score between 0-7:  no or low risk or alcohol related problems. Score between 8-15:  moderate risk of alcohol related problems. Score between 16-19:  high risk of alcohol related problems. Score 20 or above:  warrants further diagnostic evaluation for alcohol dependence and treatment.   CLINICAL FACTORS:  57 year old married male, presents with worsening depression and passive suicidal ideations. Reports depression has been linked to medical issues ( Shingles, neuralgia) and loss of job.   Psychiatric Specialty Exam: Physical Exam  ROS  Blood pressure 110/72, pulse (!) 105, temperature 97.7 F (36.5 C), temperature source Oral, resp.  rate 16, height 5\' 8"  (1.727 m), weight 66.7 kg (147 lb).Body mass index is 22.35 kg/m.  See admit note MSE   COGNITIVE FEATURES THAT CONTRIBUTE TO RISK:  Closed-mindedness and Loss of executive function    SUICIDE RISK:   Moderate:  Frequent suicidal ideation with limited intensity, and duration, some specificity in terms of plans, no associated intent, good self-control, limited dysphoria/symptomatology, some risk factors present, and identifiable protective factors, including available and accessible social support.  PLAN OF CARE: Patient will be admitted to inpatient psychiatric unit for stabilization and safety. Will provide and encourage milieu participation. Provide medication management and maked adjustments as needed.  Will follow daily.    I certify that inpatient services furnished can reasonably be expected to improve the patient's condition.   Craige CottaFernando A Cobos, MD 06/08/2017, 10:41 AM

## 2017-06-08 NOTE — ED Notes (Signed)
Received call from [facility] in reference to [facility] by [facility] spoke with [facility] Sgt. Nedra HaiLee

## 2017-06-08 NOTE — Progress Notes (Signed)
DATA ACTION RESPONSE  Objective- Pt. is visible in the dayroom, seen watching TV. Presents with a depressed affect and mood. Pt was more engaged this evening. Remeron was started at bedtime.    Subjective- Denies having any SI/HI/AVH/Pain at this time.Is cooperative and remains safe on the unit.  1:1 interaction in private to establish rapport. Encouragement, education, & support given from staff.     Safety maintained with Q 15 checks. Continue with POC.

## 2017-06-09 DIAGNOSIS — F419 Anxiety disorder, unspecified: Secondary | ICD-10-CM

## 2017-06-09 DIAGNOSIS — F39 Unspecified mood [affective] disorder: Secondary | ICD-10-CM

## 2017-06-09 DIAGNOSIS — H8110 Benign paroxysmal vertigo, unspecified ear: Secondary | ICD-10-CM | POA: Diagnosis present

## 2017-06-09 LAB — HEMOGLOBIN A1C
Hgb A1c MFr Bld: 5.9 % — ABNORMAL HIGH (ref 4.8–5.6)
Mean Plasma Glucose: 122.63 mg/dL

## 2017-06-09 NOTE — [facility] Group Notes (Signed)
Adult Psychoeducational Group Note  Date:  06/09/2017 Time:  12:09 AM  Group Topic/Focus:  Wrap-Up Group:   The focus of this group is to help patients review their daily goal of treatment and discuss progress on daily workbooks.  Participation Level:  Active  Participation Quality:  Appropriate and Attentive  Affect:  Appropriate  Cognitive:  Alert and Appropriate  Insight: Appropriate and Good  Engagement in Group:  Engaged  Modes of Intervention:  Discussion and Education  Additional Comments:  Pt attended and participated in wrap up group this evening. Pt told writer that they had a decent day because they felt more upbeat and positive. Pt goal was to get out more, in which they told writer they did "decent" with their goal. Pt did better today about getting out than they have on other days. A positive noted by the pt was that they ate really good.   Chrisandra NettersOctavia A Pantera Winterrowd 06/09/2017, 12:09 AM

## 2017-06-09 NOTE — Progress Notes (Signed)
Recreation Therapy Notes  Date: 06/09/17 Time: 0930 Location: [address] Dayroom  Group Topic: Stress Management  Goal Area(s) Addresses:  Patient will verbalize importance of using healthy stress management.  Patient will identify positive emotions associated with healthy stress management.   Intervention: Stress Management  Activity : Guided Imagery.  LRT introduced the stress management technique of guided imagery.  Patients were to follow along as LRT read a script on letting go of unnecessary baggage in order to embrace a new beginning.  Education: Stress Management, Discharge Planning.   Education Outcome: Acknowledges edcuation/In group clarification offered/Needs additional education  Clinical Observations/Feedback: Pt did not attend group.     Caroll RancherMarjette Hildegard Hlavac, LRT/CTRS          Caroll RancherLindsay, Ernestine Langworthy A 06/09/2017 11:26 AM

## 2017-06-09 NOTE — Progress Notes (Signed)
DAR NOTE: Pt present with bright affect and pleasant  mood in the unit. Pt has been  Observed in the day room with peers interacting. Pt denies physical pain, took all his meds as scheduled. As per self inventory, pt had a good night sleep, good appetite, normal energy, and good  concentration. Pt rate depression at 4, hopeless ness at 2, and anxiety at 4. Pt's safety ensured with 15 minute and environmental checks. Pt currently denies SI/HI and A/V hallucinations. Pt verbally agrees to seek staff if SI/HI or A/VH occurs and to consult with staff before acting on these thoughts. Will continue POC.

## 2017-06-09 NOTE — Tx Team (Signed)
Interdisciplinary Treatment and Diagnostic Plan Update  06/09/2017 Time of Session: 1015 Jeffrey Velazquez MRN: [id number]  Principal Diagnosis: <principal problem not specified>  Secondary Diagnoses: Active Problems:   MDD (major depressive disorder), severe (HCC)   Current Medications:  Current Facility-Administered Medications  Medication Dose Route Frequency Provider Last Rate Last Dose  . acetaminophen (TYLENOL) tablet 650 mg  650 mg Oral Q6H PRN Money, Gerlene Burdock, FNP      . alum & mag hydroxide-simeth (MAALOX/MYLANTA) 200-200-20 MG/5ML suspension 30 mL  30 mL Oral Q4H PRN Money, Gerlene Burdock, FNP      . aspirin EC tablet 81 mg  81 mg Oral Daily Cobos, Rockey Situ, MD   81 mg at 06/09/17 0831  . feeding supplement (ENSURE ENLIVE) (ENSURE ENLIVE) liquid 237 mL  237 mL Oral BID BM Money, Feliz Beam B, FNP   237 mL at 06/07/17 1724  . gabapentin (NEURONTIN) capsule 300 mg  300 mg Oral TID Cobos, Rockey Situ, MD   300 mg at 06/09/17 0831  . hydrOXYzine (ATARAX/VISTARIL) tablet 25 mg  25 mg Oral TID PRN Money, Gerlene Burdock, FNP      . magnesium hydroxide (MILK OF MAGNESIA) suspension 30 mL  30 mL Oral Daily PRN Money, Feliz Beam B, FNP      . metFORMIN (GLUCOPHAGE) tablet 1,000 mg  1,000 mg Oral BID WC Cobos, Rockey Situ, MD   1,000 mg at 06/09/17 0831  . mirtazapine (REMERON) tablet 7.5 mg  7.5 mg Oral QHS Cobos, Rockey Situ, MD   7.5 mg at 06/08/17 2100  . venlafaxine XR (EFFEXOR-XR) 24 hr capsule 150 mg  150 mg Oral Q breakfast Cobos, Rockey Situ, MD   150 mg at 06/09/17 0831   PTA Medications: Medications Prior to Admission  Medication Sig Dispense Refill Last Dose  . aspirin EC 81 MG tablet Take 1 tablet (81 mg total) by mouth daily. 30 tablet 1 06/04/2017 at AM  . atorvastatin (LIPITOR) 20 MG tablet Take 1 tablet (20 mg total) by mouth daily. 30 tablet 1 06/04/2017 at PM  . carbamazepine (TEGRETOL) 200 MG tablet Take 1 tablet (200 mg total) by mouth 2 (two) times daily. (Patient not taking: Reported on  06/05/2017) 60 tablet 3 -- at --  . cholecalciferol (VITAMIN D) 1000 units tablet Take 5 tablets (5,000 Units total) by mouth daily. (Patient not taking: Reported on 06/05/2017) 150 tablet 1 -- at --  . cyanocobalamin 1000 MCG tablet Take 1 tablet (1,000 mcg total) by mouth daily. (Patient not taking: Reported on 06/05/2017) 30 tablet 1 -- at --  . folic acid (FOLVITE) 400 MCG tablet Take 1 tablet (400 mcg total) by mouth daily. (Patient not taking: Reported on 06/05/2017) 30 tablet 1 -- at --  . gabapentin (NEURONTIN) 300 MG capsule Take 2 capsules (600 mg total) by mouth 3 (three) times daily. (Patient taking differently: Take 600-900 mg by mouth 3 (three) times daily. Take 2 capsules by mouth every morning, 2 every day at lunch and 3 capsules by mouth at bedtime) 180 capsule 1 06/04/2017 at PM  . glimepiride (AMARYL) 2 MG tablet Take 1 tablet (2 mg total) by mouth 2 (two) times daily. 60 tablet 1 06/04/2017 at PM  . Lancets 30G MISC by Does not apply route.   UTD at UTD  . lidocaine (LIDODERM) 5 % Place 1 patch onto the skin daily. Remove & Discard patch within 12 hours or as directed by MD 30 patch 1 PRN at PRN  .  metFORMIN (GLUCOPHAGE) 1000 MG tablet Take 1,000 mg by mouth 2 (two) times daily with a meal.   06/05/2017 at AM  . metFORMIN (GLUCOPHAGE-XR) 500 MG 24 hr tablet Take 1 tablet (500 mg total) by mouth 2 (two) times daily. (Patient not taking: Reported on 06/05/2017) 60 tablet 1 -- at --  . metoprolol tartrate (LOPRESSOR) 25 MG tablet Take 0.5 tablets (12.5 mg total) by mouth 2 (two) times daily. (Patient not taking: Reported on 06/05/2017) 15 tablet 1 -- at --  . OLANZapine (ZYPREXA) 20 MG tablet Take 1 tablet (20 mg total) by mouth at bedtime. 30 tablet 1 06/04/2017 at HS  . omega-3 acid ethyl esters (LOVAZA) 1 g capsule Take 2 capsules (2 g total) by mouth daily. 60 capsule 1 06/04/2017 at AM  . omeprazole (PRILOSEC) 20 MG capsule Take 2 capsules (40 mg total) by mouth daily. 60 capsule 1 06/04/2017  at AM  . oxyCODONE (OXY IR/ROXICODONE) 5 MG immediate release tablet Take 5 mg by mouth every 6 (six) hours as needed for severe pain.   PRN at PRN  . sucralfate (CARAFATE) 1 g tablet Take 1 tablet (1 g total) by mouth 4 (four) times daily -  with meals and at bedtime. (Patient not taking: Reported on 06/05/2017) 120 tablet 1 -- at --  . venlafaxine XR (EFFEXOR-XR) 150 MG 24 hr capsule Take 2 capsules (300 mg total) by mouth daily with breakfast. 60 capsule 1 06/04/2017 at AM    Patient Stressors: Financial difficulties Health problems Occupational concerns Traumatic event  Patient Strengths: Average or above average intelligence Capable of independent living Manufacturing systems engineer Motivation for treatment/growth Supportive family/friends Work skills  Treatment Modalities: Medication Management, Group therapy, Case management,  1 to 1 session with clinician, Psychoeducation, Recreational therapy.   Physician Treatment Plan for Primary Diagnosis: <principal problem not specified> Long Term Goal(s): Improvement in symptoms so as ready for discharge Improvement in symptoms so as ready for discharge   Short Term Goals: Ability to identify changes in lifestyle to reduce recurrence of condition will improve Ability to maintain clinical measurements within normal limits will improve Ability to verbalize feelings will improve Ability to disclose and discuss suicidal ideas Ability to demonstrate self-control will improve Ability to identify and develop effective coping behaviors will improve Ability to maintain clinical measurements within normal limits will improve  Medication Management: Evaluate patient's response, side effects, and tolerance of medication regimen.  Therapeutic Interventions: 1 to 1 sessions, Unit Group sessions and Medication administration.  Evaluation of Outcomes: Progressing  Physician Treatment Plan for Secondary Diagnosis: Active Problems:   MDD (major depressive  disorder), severe (HCC)  Long Term Goal(s): Improvement in symptoms so as ready for discharge Improvement in symptoms so as ready for discharge   Short Term Goals: Ability to identify changes in lifestyle to reduce recurrence of condition will improve Ability to maintain clinical measurements within normal limits will improve Ability to verbalize feelings will improve Ability to disclose and discuss suicidal ideas Ability to demonstrate self-control will improve Ability to identify and develop effective coping behaviors will improve Ability to maintain clinical measurements within normal limits will improve     Medication Management: Evaluate patient's response, side effects, and tolerance of medication regimen.  Therapeutic Interventions: 1 to 1 sessions, Unit Group sessions and Medication administration.  Evaluation of Outcomes: Progressing   RN Treatment Plan for Primary Diagnosis: <principal problem not specified> Long Term Goal(s): Knowledge of disease and therapeutic regimen to maintain health will improve  Short Term Goals: Ability to identify and develop effective coping behaviors will improve and Compliance with prescribed medications will improve  Medication Management: RN will administer medications as ordered by provider, will assess and evaluate patient's response and provide education to patient for prescribed medication. RN will report any adverse and/or side effects to prescribing provider.  Therapeutic Interventions: 1 on 1 counseling sessions, Psychoeducation, Medication administration, Evaluate responses to treatment, Monitor vital signs and CBGs as ordered, Perform/monitor CIWA, COWS, AIMS and Fall Risk screenings as ordered, Perform wound care treatments as ordered.  Evaluation of Outcomes: Progressing   LCSW Treatment Plan for Primary Diagnosis: <principal problem not specified> Long Term Goal(s): Safe transition to appropriate next level of care at discharge,  Engage patient in therapeutic group addressing interpersonal concerns.  Short Term Goals: Engage patient in aftercare planning with referrals and resources, Increase social support and Increase skills for wellness and recovery  Therapeutic Interventions: Assess for all discharge needs, 1 to 1 time with Social worker, Explore available resources and support systems, Assess for adequacy in community support network, Educate family and significant other(s) on suicide prevention, Complete Psychosocial Assessment, Interpersonal group therapy.  Evaluation of Outcomes: Progressing   Progress in Treatment: Attending groups: Yes. Participating in groups: Yes. Taking medication as prescribed: Yes. Toleration medication: Yes. Family/Significant other contact made: No, will contact:  wife Patient understands diagnosis: Yes. Discussing patient identified problems/goals with staff: Yes. Medical problems stabilized or resolved: Yes. Denies suicidal/homicidal ideation: Yes. Issues/concerns per patient self-inventory: No. Other: none  New problem(s) identified: No, Describe:  none  New Short Term/Long Term Goal(s): Pt goal: to get better, get better sleep, get medicine straightened out.  Discharge Plan or Barriers:   Reason for Continuation of Hospitalization: Depression Medication stabilization  Estimated Length of Stay: 3-5 days.  Attendees: Patient:Jeffrey Velazquez 06/09/2017   Physician: Dr Jama Flavorsobos, MD 06/09/2017   Nursing: Waynetta SandyJan Wright, RN 06/09/2017   RN Care Manager: 06/09/2017   Social Worker: Daleen SquibbGreg Cadince Hilscher 06/09/2017   Recreational Therapist:  06/09/2017   Other:  06/09/2017   Other:  06/09/2017   Other: 06/09/2017        Scribe for Treatment Team: Lorri FrederickWierda, Lindy Pennisi Jon, LCSW 06/09/2017 11:09 AM

## 2017-06-09 NOTE — Progress Notes (Signed)
[facility] MD Progress Note  06/09/2017 3:02 PM Jeffrey Velazquez  MRN:  [id number]   Subjective:  Patient reports that he is feeling some better. He still is concerned about the stressors he has at home but is working on his coping skills. He reports that he still has "ringing in his ears and gets dizzy once in a while." He asked if it could be his medications, but reports that he has had this symptoms for a while with multiple medications used. He denies any SI/HI/AVH and contracts for safety.   Objective: Patient's chart and findings reviewed and discussed with treatment team. Patient tests positive for BPPV with Dix-Hallpike Maneuver and the patient is requested to do Epeley Maneuvers. Patient will continue current medications as prescribed. Patient presents in the day room and has been seen intercating appropriately.    Principal Problem: MDD (major depressive disorder), severe (HCC) Diagnosis:   Patient Active Problem List   Diagnosis Date Noted  . BPPV (benign paroxysmal positional vertigo) [H81.10] 06/09/2017  . MDD (major depressive disorder), severe (HCC) [F32.2] 06/07/2017  . Diabetes (HCC) [E11.9] 02/21/2017  . HTN (hypertension) [I10] 02/21/2017  . GERD (gastroesophageal reflux disease) [K21.9] 02/21/2017  . Vitamin B12 deficiency [E53.8] 02/21/2017  . Severe episode of recurrent major depressive disorder, with psychotic features (HCC) [F33.3] 02/20/2017  . 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 Chest pain [R07.9] 10/29/2016  . Sinus tachycardia [R00.0] 10/29/2016  . Hyponatremia [E87.1] 10/29/2016  . Hypokalemia [E87.6] 10/29/2016   Total Time spent with patient: 25 minutes  Past Psychiatric History: See H&P   Past Medical History:  Past Medical History:  Diagnosis Date  . Anemia   . Anxiety   . Depression   . Diabetes mellitus without complication (HCC)   . DOE (dyspnea on exertion)   . Early satiety   . Epigastric abdominal pain   . GERD (gastroesophageal  reflux disease)   . Hyperlipidemia   . Irritable bowel syndrome   . Shingles   . Tachycardia   . Unintended weight loss     Past Surgical History:  Procedure Laterality Date  . COLONOSCOPY WITH PROPOFOL N/A 11/19/2016   Procedure: COLONOSCOPY WITH PROPOFOL;  Surgeon: Scot Jun, MD;  Location: [facility] ENDOSCOPY;  Service: Endoscopy;  Laterality: N/A;  . ESOPHAGOGASTRODUODENOSCOPY (EGD) WITH PROPOFOL N/A 11/06/2016   Procedure: ESOPHAGOGASTRODUODENOSCOPY (EGD) WITH PROPOFOL;  Surgeon: Scot Jun, MD;  Location: [facility] ENDOSCOPY;  Service: Endoscopy;  Laterality: N/A;  . FRACTURE SURGERY Left 2010   Clavicle  . TONSILLECTOMY    . TOOTH EXTRACTION     wisdom teeth x 4   Family History:  Family History  Problem Relation Age of Onset  . Diabetes Mother   . Heart disease Mother   . Cancer Mother   . CAD Mother   . Heart disease Father   . CAD Father   . Diabetes Sister   . Emphysema Maternal Grandfather   . Diabetes Paternal Grandmother   . Cancer Paternal Grandfather    Family Psychiatric  History: See H&P Social History:  Social History   Substance and Sexual Activity  Alcohol Use No     Social History   Substance and Sexual Activity  Drug Use No    Social History   Socioeconomic History  . Marital status: Married    Spouse name: None  . Number of children: None  . Years of education: None  . Highest education level: None  Social Needs  .  Financial resource strain: None  . Food insecurity - worry: None  . Food insecurity - inability: None  . Transportation needs - medical: None  . Transportation needs - non-medical: None  Occupational History  . None  Tobacco Use  . Smoking status: Never Smoker  . Smokeless tobacco: Never Used  Substance and Sexual Activity  . Alcohol use: No  . Drug use: No  . Sexual activity: Yes  Other Topics Concern  . None  Social History Narrative  . None   Additional Social History:                          Sleep: Good  Appetite:  Good  Current Medications: Current Facility-Administered Medications  Medication Dose Route Frequency Provider Last Rate Last Dose  . acetaminophen (TYLENOL) tablet 650 mg  650 mg Oral Q6H PRN Money, Gerlene Burdock, FNP      . alum & mag hydroxide-simeth (MAALOX/MYLANTA) 200-200-20 MG/5ML suspension 30 mL  30 mL Oral Q4H PRN Money, Gerlene Burdock, FNP      . aspirin EC tablet 81 mg  81 mg Oral Daily Jerrelle Michelsen, Rockey Situ, MD   81 mg at 06/09/17 0831  . feeding supplement (ENSURE ENLIVE) (ENSURE ENLIVE) liquid 237 mL  237 mL Oral BID BM Money, Feliz Beam B, FNP   237 mL at 06/07/17 1724  . gabapentin (NEURONTIN) capsule 300 mg  300 mg Oral TID Raheem Kolbe, Rockey Situ, MD   300 mg at 06/09/17 1140  . hydrOXYzine (ATARAX/VISTARIL) tablet 25 mg  25 mg Oral TID PRN Money, Gerlene Burdock, FNP      . magnesium hydroxide (MILK OF MAGNESIA) suspension 30 mL  30 mL Oral Daily PRN Money, Feliz Beam B, FNP      . metFORMIN (GLUCOPHAGE) tablet 1,000 mg  1,000 mg Oral BID WC Tracey Hermance, Rockey Situ, MD   1,000 mg at 06/09/17 0831  . mirtazapine (REMERON) tablet 7.5 mg  7.5 mg Oral QHS Tomia Enlow, Rockey Situ, MD   7.5 mg at 06/08/17 2100  . venlafaxine XR (EFFEXOR-XR) 24 hr capsule 150 mg  150 mg Oral Q breakfast Abbi Mancini, Rockey Situ, MD   150 mg at 06/09/17 1610    Lab Results:  Results for orders placed or performed during the hospital encounter of 06/07/17 (from the past 48 hour(s))  Hemoglobin A1c     Status: Abnormal   Collection Time: 06/09/17  7:14 AM  Result Value Ref Range   Hgb A1c MFr Bld 5.9 (H) 4.8 - 5.6 %    Comment: (NOTE) Pre diabetes:          5.7%-6.4% Diabetes:              >6.4% Glycemic control for   <7.0% adults with diabetes    Mean Plasma Glucose 122.63 mg/dL    Comment: Performed at [facility], [address] [place], [place] [address]    Blood Alcohol level:  Lab Results  Component Value Date   ETH <10 06/05/2017    Metabolic Disorder Labs: Lab Results  Component Value Date    HGBA1C 5.9 (H) 06/09/2017   MPG 122.63 06/09/2017   MPG 139.85 02/20/2017   No results found for: PROLACTIN Lab Results  Component Value Date   CHOL 158 02/20/2017   TRIG 215 (H) 02/20/2017   HDL 45 02/20/2017   CHOLHDL 3.5 02/20/2017   VLDL 43 (H) 02/20/2017   LDLCALC 70 02/20/2017    Physical Findings: AIMS:  Facial and Oral Movements Muscles of Facial Expression: None, normal Lips and Perioral Area: None, normal Jaw: None, normal Tongue: None, normal,Extremity Movements Upper (arms, wrists, hands, fingers): None, normal Lower (legs, knees, ankles, toes): None, normal, Trunk Movements Neck, shoulders, hips: None, normal, Overall Severity Severity of abnormal movements (highest score from questions above): None, normal Incapacitation due to abnormal movements: None, normal Patient's awareness of abnormal movements (rate only patient's report): No Awareness, Dental Status Current problems with teeth and/or dentures?: No Does patient usually wear dentures?: No  CIWA:    COWS:     Musculoskeletal: Strength & Muscle Tone: within normal limits Gait & Station: normal Patient leans: N/A  Psychiatric Specialty Exam: Physical Exam  Nursing note and vitals reviewed. Constitutional: He is oriented to person, place, and time. He appears well-developed and well-nourished.  Respiratory: Effort normal.  Musculoskeletal: Normal range of motion.  Neurological: He is alert and oriented to person, place, and time.  Skin: Skin is warm.    Review of Systems  Constitutional: Negative.   HENT: Positive for tinnitus.   Eyes: Negative.   Respiratory: Negative.   Cardiovascular: Negative.   Gastrointestinal: Negative.   Genitourinary: Negative.   Musculoskeletal: Negative.   Skin: Negative.   Neurological: Negative.   Endo/Heme/Allergies: Negative.   Psychiatric/Behavioral: Positive for depression. Negative for hallucinations and suicidal ideas.    Blood pressure 100/70, pulse  (!) 102, temperature 98 F (36.7 C), temperature source Oral, resp. rate 12, height 5\' 8"  (1.727 m), weight 66.7 kg (147 lb).Body mass index is 22.35 kg/m.  General Appearance: Casual  Eye Contact:  Good  Speech:  Clear and Coherent and Normal Rate  Volume:  Normal  Mood:  Depressed  Affect:  Flat  Thought Process:  Goal Directed and Descriptions of Associations: Intact  Orientation:  Full (Time, Place, and Person)  Thought Content:  WDL  Suicidal Thoughts:  No  Homicidal Thoughts:  No  Memory:  Immediate;   Good Recent;   Good Remote;   Good  Judgement:  Good  Insight:  Good  Psychomotor Activity:  Normal  Concentration:  Concentration: Good and Attention Span: Good  Recall:  Good  Fund of Knowledge:  Good  Language:  Good  Akathisia:  No  Handed:  Right  AIMS (if indicated):     Assets:  Communication Skills Desire for Improvement Financial Resources/Insurance Housing Physical Health Social Support Transportation  ADL's:  Intact  Cognition:  WNL  Sleep:  Number of Hours: 6.75   Problems Addressed: MDD severe BPPV  Treatment Plan Summary: Daily contact with patient to assess and evaluate symptoms and progress in treatment, Medication management and Plan is to:  -Continue Effexor-XR 150 mg PO Daily for mood stability -Continue Remeron 7.5 mg PO QHS for mood stability -Continue Gabapentin 300 mg POP TID  -Continue Vistaril 25 mg PO TID PRN for anxiety -Start Epeley maneuvers  -Encourage group therapy participation  Maryfrances Bunnellravis B Money, FNP 06/09/2017, 3:02 PM   Agree with NP Progress Note

## 2017-06-09 NOTE — [facility] Group Notes (Signed)
[facility] Mental [employer] Group Therapy      06/09/2017 2:29 PM  Type of Therapy: Mental Health Association Presentation  Participation Level: Active  Participation Quality: Attentive  Affect: Appropriate  Cognitive: Oriented  Insight: Developing/Improving  Engagement in Therapy: Engaged  Modes of Intervention: Discussion, Education and Socialization  Summary of Progress/Problems:  Mental Health Association (MHA) Speaker came to talk about his personal journey with mental health. The pt processed ways by which to relate to the speaker. MHA speaker provided handouts and educational information pertaining to groups and services offered by the MHA. Pt was engaged in speaker's presentation and was receptive to resources provided.    Shameria Trimarco LCSWA Clinical Social Worker   

## 2017-06-09 NOTE — [facility] Suicide Risk Assessment (Signed)
[facility] INPATIENT:  Family/Significant Other Suicide Prevention Education  Suicide Prevention Education:  Education Completed; Jeffrey Velazquez (wife, [phone]) has been identified by the patient as the family member/significant other with whom the patient will be residing, and identified as the person(s) who will aid the patient in the event of a mental health crisis (suicidal ideations/suicide attempt).  With written consent from the patient, the family member/significant other has been provided the following suicide prevention education, prior to the and/or following the discharge of the patient.  The suicide prevention education provided includes the following:  Suicide risk factors  Suicide prevention and interventions  National Suicide Hotline telephone number  [facility] assessment telephone number  [facility] Assistance 911  [facility] and/or [facility]idential [facility] Crisis [facility] telephone number  Request made of family/significant other to:  Remove weapons (e.g., guns, rifles, knives), all items previously/currently identified as safety concern.    Remove drugs/medications (over-the-counter, prescriptions, illicit drugs), all items previously/currently identified as a safety concern.  The family member/significant other verbalizes understanding of the suicide prevention education information provided.  The family member/significant other agrees to remove the items of safety concern listed above.  Jeffrey Velazquez 06/09/2017, 11:41 AM

## 2017-06-09 NOTE — [facility] Group Notes (Signed)
Adult Psychoeducational Group Note  Date:  06/09/2017 Time:  11:35 PM  Group Topic/Focus:  Wrap-Up Group:   The focus of this group is to help patients review their daily goal of treatment and discuss progress on daily workbooks.  Participation Level:  Active  Participation Quality:  Appropriate and Attentive  Affect:  Appropriate  Cognitive:  Alert and Appropriate  Insight: Appropriate and Good  Engagement in Group:  Engaged  Modes of Intervention:  Discussion and Education  Additional Comments:  Pt attended and participated in wrap up group this evening. Pt had a good day because they had a good nights sleep due to having the right meds. Pt goal was to be more positive and to have a better outlook on life. A positive noted by the pt was that they had a good day and felt good, also they got out of their room as well.   Chrisandra NettersOctavia A Trust Leh 06/09/2017, 11:35 PM

## 2017-06-09 NOTE — Progress Notes (Signed)
Nursing Progress Note: 7p-7a D: Pt currently presents with a anxious/pleasant affect and behavior. Pt states "Groups really make me understand that I am not alone. People go through much worse everyday. I have a better understanding of what to do next after this." Interacting appropriately with the milieu. Pt reports good sleep during the previous night with current medication regimen. Pt did attend wrap-up group.  A: Pt provided with medications per providers orders. Pt's labs and vitals were monitored throughout the night. Pt supported emotionally and encouraged to express concerns and questions. Pt educated on medications.  R: Pt's safety ensured with 15 minute and environmental checks. Pt currently denies SI, HI, and AVH. Pt verbally contracts to seek staff if SI,HI, or AVH occurs and to consult with staff before acting on any harmful thoughts. Will continue to monitor.

## 2017-06-10 MED ORDER — GABAPENTIN 400 MG PO CAPS
400.0000 mg | ORAL_CAPSULE | Freq: Three times a day (TID) | ORAL | Status: DC
Start: 2017-06-10 — End: 2017-06-13
  Administered 2017-06-10 – 2017-06-13 (×9): 400 mg via ORAL
  Filled 2017-06-10 (×16): qty 1

## 2017-06-10 MED ORDER — VENLAFAXINE HCL ER 75 MG PO CP24
75.0000 mg | ORAL_CAPSULE | Freq: Every day | ORAL | Status: DC
  Administered 2017-06-11: 75 mg via ORAL
  Filled 2017-06-10 (×2): qty 1

## 2017-06-10 MED ORDER — MIRTAZAPINE 15 MG PO TABS
15.0000 mg | ORAL_TABLET | Freq: Every day | ORAL | Status: DC
  Administered 2017-06-10: 15 mg via ORAL
  Filled 2017-06-10 (×2): qty 1

## 2017-06-10 NOTE — [facility] Group Notes (Signed)
[facility] LCSW Group Therapy Note  Date/Time: 06/10/17, 1315  Type of Therapy/Topic:  Group Therapy:  Balance in Life  Participation Level:  minimal Description of Group:    This group will address the concept of balance and how it feels and looks when one is unbalanced. Patients will be encouraged to process areas in their lives that are out of balance, and identify reasons for remaining unbalanced. Facilitators will guide patients utilizing problem- solving interventions to address and correct the stressor making their life unbalanced. Understanding and applying boundaries will be explored and addressed for obtaining  and maintaining a balanced life. Patients will be encouraged to explore ways to assertively make their unbalanced needs known to significant others in their lives, using other group members and facilitator for support and feedback.  Therapeutic Goals: 1. Patient will identify two or more emotions or situations they have that consume much of in their lives. 2. Patient will identify signs/triggers that life has become out of balance:  3. Patient will identify two ways to set boundaries in order to achieve balance in their lives:  4. Patient will demonstrate ability to communicate their needs through discussion and/or role plays  Summary of Patient Progress: Pt shared that physical, financial, and spiritual are areas that are out of balance in his life.  Pt responded to CSW questions during group discussion but otherwise did not speak up.          Therapeutic Modalities:   Cognitive Behavioral Therapy Solution-Focused Therapy Assertiveness Training  Daleen SquibbGreg Elizabth Palka, [place]

## 2017-06-10 NOTE — Progress Notes (Signed)
Patient ID: Jeffrey Velazquez, male   DOB: Oct 09, 1960, 57 y.o.   MRN: [id number]  Pt currently presents with a flat affect and guarded behavior. Pt endorses ongoing depression and hopelessness. While pt remains in the dayroom, interaction with peers is limited. Pt reports he is hopeful that the new sleep medication will work [employer] managertonight.  Pt provided with medications per providers orders. Pt's labs and vitals were monitored throughout the night. Pt given a 1:1 about emotional and mental status. Pt supported and encouraged to express concerns and questions. Pt educated on medications.  Pt's safety ensured with 15 minute and environmental checks. Pt currently denies SI/HI and A/V hallucinations. Pt verbally agrees to seek staff if SI/HI or A/VH occurs and to consult with staff before acting on any harmful thoughts. Will continue POC.

## 2017-06-10 NOTE — Progress Notes (Signed)
[facility] MD Progress Note  06/10/2017 8:31 AM Jeffrey Velazquez  MRN:  [id number]   Subjective:   Patient reports he is feeling better than on admission, less severely depressed . He is still ruminative about his recent losses, but presents less hopeless and states he is starting to feel more optimistic that this year will be a better one for him. Denies suicidal ideations at this time. States he feels better on less medications - states he felt that , prior to admission, he had been on " too many medications", and feels that some of his symptoms may have been related to medication side effects. At this time denies Venlafaxine WDL symptoms.  Objective: Patient case discussed with treatment team and patient seen . Staff reports patient states feeling better, but still presenting with flat, anxious affect .  At this time he presents with partially improved mood and range of affect . Does report ongoing depression but states he is feeling better, and denies suicidal ideations at this time. Denies medication side effects - tolerating Effexor XR taper well thus far, without WDL symptoms. (He had been feeling more depressed, in spite of compliance with Effexor XR at 300 mgrs daily, so it is being tapered off)  Visible on unit, going to groups, pleasant on approach. With patient's express consent I spoke with his wife via phone, who corroborates she feels patient is improving .   Principal Problem: MDD (major depressive disorder), severe (HCC) Diagnosis:   Patient Active Problem List   Diagnosis Date Noted  . BPPV (benign paroxysmal positional vertigo) [H81.10] 06/09/2017  . MDD (major depressive disorder), severe (HCC) [F32.2] 06/07/2017  . Diabetes (HCC) [E11.9] 02/21/2017  . HTN (hypertension) [I10] 02/21/2017  . GERD (gastroesophageal reflux disease) [K21.9] 02/21/2017  . Vitamin B12 deficiency [E53.8] 02/21/2017  . Severe episode of recurrent major depressive disorder, with psychotic features  (HCC) [F33.3] 02/20/2017  . 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 Chest pain [R07.9] 10/29/2016  . Sinus tachycardia [R00.0] 10/29/2016  . Hyponatremia [E87.1] 10/29/2016  . Hypokalemia [E87.6] 10/29/2016   Total Time spent with patient: 20 minutes  Past Psychiatric History: See H&P   Past Medical History:  Past Medical History:  Diagnosis Date  . Anemia   . Anxiety   . Depression   . Diabetes mellitus without complication (HCC)   . DOE (dyspnea on exertion)   . Early satiety   . Epigastric abdominal pain   . GERD (gastroesophageal reflux disease)   . Hyperlipidemia   . Irritable bowel syndrome   . Shingles   . Tachycardia   . Unintended weight loss     Past Surgical History:  Procedure Laterality Date  . COLONOSCOPY WITH PROPOFOL N/A 11/19/2016   Procedure: COLONOSCOPY WITH PROPOFOL;  Surgeon: Scot Jun, MD;  Location: [facility] ENDOSCOPY;  Service: Endoscopy;  Laterality: N/A;  . ESOPHAGOGASTRODUODENOSCOPY (EGD) WITH PROPOFOL N/A 11/06/2016   Procedure: ESOPHAGOGASTRODUODENOSCOPY (EGD) WITH PROPOFOL;  Surgeon: Scot Jun, MD;  Location: [facility] ENDOSCOPY;  Service: Endoscopy;  Laterality: N/A;  . FRACTURE SURGERY Left 2010   Clavicle  . TONSILLECTOMY    . TOOTH EXTRACTION     wisdom teeth x 4   Family History:  Family History  Problem Relation Age of Onset  . Diabetes Mother   . Heart disease Mother   . Cancer Mother   . CAD Mother   . Heart disease Father   . CAD Father   . Diabetes Sister   .  Emphysema Maternal Grandfather   . Diabetes Paternal Grandmother   . Cancer Paternal Grandfather    Family Psychiatric  History: See H&P Social History:  Social History   Substance and Sexual Activity  Alcohol Use No     Social History   Substance and Sexual Activity  Drug Use No    Social History   Socioeconomic History  . Marital status: Married    Spouse name: None  . Number of children: None  . Years of education: None  . Highest  education level: None  Social Needs  . Financial resource strain: None  . Food insecurity - worry: None  . Food insecurity - inability: None  . Transportation needs - medical: None  . Transportation needs - non-medical: None  Occupational History  . None  Tobacco Use  . Smoking status: Never Smoker  . Smokeless tobacco: Never Used  Substance and Sexual Activity  . Alcohol use: No  . Drug use: No  . Sexual activity: Yes  Other Topics Concern  . None  Social History Narrative  . None   Additional Social History:   Sleep: Good  Appetite:  Good  Current Medications: Current Facility-Administered Medications  Medication Dose Route Frequency Provider Last Rate Last Dose  . acetaminophen (TYLENOL) tablet 650 mg  650 mg Oral Q6H PRN Money, Gerlene Burdock, FNP      . alum & mag hydroxide-simeth (MAALOX/MYLANTA) 200-200-20 MG/5ML suspension 30 mL  30 mL Oral Q4H PRN Money, Gerlene Burdock, FNP      . aspirin EC tablet 81 mg  81 mg Oral Daily Cobos, Rockey Situ, MD   81 mg at 06/10/17 0830  . feeding supplement (ENSURE ENLIVE) (ENSURE ENLIVE) liquid 237 mL  237 mL Oral BID BM Money, Feliz Beam B, FNP   237 mL at 06/09/17 1517  . gabapentin (NEURONTIN) capsule 300 mg  300 mg Oral TID Cobos, Rockey Situ, MD   300 mg at 06/10/17 0830  . hydrOXYzine (ATARAX/VISTARIL) tablet 25 mg  25 mg Oral TID PRN Money, Gerlene Burdock, FNP      . magnesium hydroxide (MILK OF MAGNESIA) suspension 30 mL  30 mL Oral Daily PRN Money, Feliz Beam B, FNP      . metFORMIN (GLUCOPHAGE) tablet 1,000 mg  1,000 mg Oral BID WC Cobos, Rockey Situ, MD   1,000 mg at 06/10/17 0830  . mirtazapine (REMERON) tablet 7.5 mg  7.5 mg Oral QHS Cobos, Rockey Situ, MD   7.5 mg at 06/09/17 2124  . venlafaxine XR (EFFEXOR-XR) 24 hr capsule 150 mg  150 mg Oral Q breakfast Cobos, Rockey Situ, MD   150 mg at 06/10/17 0830    Lab Results:  Results for orders placed or performed during the hospital encounter of 06/07/17 (from the past 48 hour(s))  Hemoglobin A1c      Status: Abnormal   Collection Time: 06/09/17  7:14 AM  Result Value Ref Range   Hgb A1c MFr Bld 5.9 (H) 4.8 - 5.6 %    Comment: (NOTE) Pre diabetes:          5.7%-6.4% Diabetes:              >6.4% Glycemic control for   <7.0% adults with diabetes    Mean Plasma Glucose 122.63 mg/dL    Comment: Performed at [facility], [address] [place], [place] [address]    Blood Alcohol level:  Lab Results  Component Value Date   [facility] <10 06/05/2017  Metabolic Disorder Labs: Lab Results  Component Value Date   HGBA1C 5.9 (H) 06/09/2017   MPG 122.63 06/09/2017   MPG 139.85 02/20/2017   No results found for: PROLACTIN Lab Results  Component Value Date   CHOL 158 02/20/2017   TRIG 215 (H) 02/20/2017   HDL 45 02/20/2017   CHOLHDL 3.5 02/20/2017   VLDL 43 (H) 02/20/2017   LDLCALC 70 02/20/2017    Physical Findings: AIMS: Facial and Oral Movements Muscles of Facial Expression: None, normal Lips and Perioral Area: None, normal Jaw: None, normal Tongue: None, normal,Extremity Movements Upper (arms, wrists, hands, fingers): None, normal Lower (legs, knees, ankles, toes): None, normal, Trunk Movements Neck, shoulders, hips: None, normal, Overall Severity Severity of abnormal movements (highest score from questions above): None, normal Incapacitation due to abnormal movements: None, normal Patient's awareness of abnormal movements (rate only patient's report): No Awareness, Dental Status Current problems with teeth and/or dentures?: No Does patient usually wear dentures?: No  CIWA:    COWS:     Musculoskeletal: Strength & Muscle Tone: within normal limits Gait & Station: normal Patient leans: N/A  Psychiatric Specialty Exam: Physical Exam  Nursing note and vitals reviewed. Constitutional: He is oriented to person, place, and time. He appears well-developed and well-nourished.  Respiratory: Effort normal.  Musculoskeletal: Normal range of motion.  Neurological: He  is alert and oriented to person, place, and time.  Skin: Skin is warm.    Review of Systems  Constitutional: Negative.   HENT: Positive for tinnitus.   Eyes: Negative.   Respiratory: Negative.   Cardiovascular: Negative.   Gastrointestinal: Negative.   Genitourinary: Negative.   Musculoskeletal: Negative.   Skin: Negative.   Neurological: Negative.   Endo/Heme/Allergies: Negative.   Psychiatric/Behavioral: Positive for depression. Negative for hallucinations and suicidal ideas.  denies headache, denies chest pain, no shortness of breath  Blood pressure (!) 91/59, pulse (!) 105, temperature 98 F (36.7 C), temperature source Oral, resp. rate 16, height 5\' 8"  (1.727 m), weight 66.7 kg (147 lb).Body mass index is 22.35 kg/m.  General Appearance: Fairly Groomed  Eye Contact:  Good  Speech:  Normal Rate  Volume:  Normal  Mood:  partially improved, less depressed   Affect:  less constricted, smiles appropriately at times   Thought Process:  Linear and Descriptions of Associations: Intact  Orientation:  Other:  fully alert and attentive  Thought Content:  no hallucinations, no delusions, not internally preoccupied   Suicidal Thoughts:  No currently denies suicidal plan or intentions and contracts for safety on unit, denies HI  Homicidal Thoughts:  No  Memory:  recent and remote grossly intact   Judgement:  Other:  improved   Insight:  Present  Psychomotor Activity:  Normal  Concentration:  Concentration: Good and Attention Span: Good  Recall:  Good  Fund of Knowledge:  Good  Language:  Good  Akathisia:  No  Handed:  Right  AIMS (if indicated):     Assets:  Communication Skills Desire for Improvement Financial Resources/Insurance Housing Physical Health Social Support Transportation  ADL's:  Intact  Cognition:  WNL  Sleep:  Number of Hours: 6.75   Assessment - patient is presenting with gradually improving mood and range of affect , but reports lingering depression and  affect is still constricted . Thus far tolerating medications well, and expressing feeling better in the context of decreased amount of medications/more simplified medication regimen. Tolerating Remeron well . States appetite is improving , which may be related to Remeron  trial and to improving mood .   Treatment Plan Summary: Daily contact with patient to assess and evaluate symptoms and progress in treatment, Medication management and Plan is to:  I have reviewed treatment plan as below today 1/24 -Decrease  Effexor-XR to 75  mg PO Daily for depression  -Increase  Remeron to 15  mg PO QHS for depression, insomnia  -Continue Gabapentin 300 mg POP TID for anxiety  -Continue Vistaril 25 mg PO TID PRN for anxiety -Encourage group therapy participation to work on Pharmacologistcoping skills and symptom reduction -Treatment team working on disposition planning options Craige CottaFernando A Cobos, MD 06/10/2017, 8:31 AM   Patient ID: Jeffrey CreekJohn David Velazquez, male   DOB: November 22, 1960, 57 y.o.   MRN: [id number]

## 2017-06-10 NOTE — [facility] Group Notes (Signed)
[facility] Group Notes:  (Nursing/MHT/Case Management/Adjunct)  Date:  06/10/2017  Time:  1615  Type of Therapy:  Nurse Education - Suicide Safety Plan  Participation Level:  Did Not Attend  Participation Quality:    Affect:    Cognitive:    Insight:    Engagement in Group:    Modes of Intervention:    Summary of Progress/Problems: Patient invited and strongly encouraged however remained in bed.  Lawrence MarseillesFriedman, Cale Decarolis Eakes 06/10/2017, 6:55 PM

## 2017-06-10 NOTE — Plan of Care (Signed)
Patient verbalizes understanding of information, education provided. 

## 2017-06-10 NOTE — [facility] Group Notes (Signed)
Orientation group was facilitated this morning. Writer discussed rules and expectations of the unit. Writer discussed and passed out self inventory sheets. Writer asked if any patients needed bus passes and/or notes for work or school. Writer discussed the schedule for the day and explained the process for laundry, phone times, and meal times. Writer ask if patients had any questions or concerns about medications, discharge, etc. Writer ask for each patient to give one goal they wanted to work on today and pt stated have a more positive outlook. Group was closed by writer collecting self inventory sheets and explaining what was next on the schedule. Group adjourned.

## 2017-06-10 NOTE — Progress Notes (Signed)
D: Patient observed isolative to room frequently. Patient states he is doing "a little better." Forwards minimal information. Patient's affect flat, mood anxious and depressed. Per self inventory and discussions with writer, rates depression at a 3/10, hopelessness at a 3/10 and anxiety at a 2/10. Rates sleep as good, appetite as good, energy as low and concentration as good.  States goal for today is "going home, talk to Dr., more positive outlook on life." Denies pain, physical complaints.   A: Medicated per orders, no prns required or requested. Level III obs in place for safety. Emotional support offered and self inventory reviewed. Encouraged completion of Suicide Safety Plan and programming participation. Discussed POC with MD, SW.  Fall prevention plan in place and reviewed with patient as pt is a identified as a high fall risk.   R: Patient verbalizes understanding of POC, falls prevention education.  Patient denies SI/HI/AVH and remains safe on level III obs. Will continue to monitor closely and make verbal contact frequently.

## 2017-06-11 MED ORDER — VENLAFAXINE HCL ER 37.5 MG PO CP24
37.5000 mg | ORAL_CAPSULE | Freq: Every day | ORAL | Status: AC
  Administered 2017-06-12: 37.5 mg via ORAL
  Filled 2017-06-11: qty 1

## 2017-06-11 MED ORDER — MIRTAZAPINE 30 MG PO TABS
30.0000 mg | ORAL_TABLET | Freq: Every day | ORAL | Status: DC
  Administered 2017-06-11 – 2017-06-12 (×2): 30 mg via ORAL
  Filled 2017-06-11 (×4): qty 1

## 2017-06-11 NOTE — Progress Notes (Signed)
Nursing Progress Note: 7-7p  D- Mood is depressed and anxious.Pt has been c/o of feeling sick to his stomach after breakfast and has been having gas.Affect is blunted and sad. Pt is able to contract for safety. Continues to have difficulty staying asleep.  A - Pt has been in room most of the day lying in bed,feeling weak.Ginger ale and maalox given. Refused lunch Support and encouragement offered, safety maintained with q 15 minutes.  R-Contracts for safety and continues to follow treatment plan, working on learning new coping skills for depression.

## 2017-06-11 NOTE — Progress Notes (Signed)
Recreation Therapy Notes  Date: 06/11/17 Time: 0930 Location: [address] Dayroom  Group Topic: Stress Management  Goal Area(s) Addresses:  Patient will verbalize importance of using healthy stress management.  Patient will identify positive emotions associated with healthy stress management.   Intervention: Stress Management  Activity : Progressive Muscle Relaxation.  LRT introduced the stress management technique of progressive muscle relaxation.  LRT led patients through the technique which allowed them to tense and relax each muscle group individually.  Education:  Stress Management, Discharge Planning.   Education Outcome: Acknowledges edcuation/In group clarification offered/Needs additional education  Clinical Observations/Feedback: Pt did not attend group.     Caroll RancherMarjette Srishti Strnad, LRT/CTRS         Lillia AbedLindsay, Bular Hickok A 06/11/2017 11:02 AM

## 2017-06-11 NOTE — [facility] Group Notes (Signed)
LCSW Group Therapy Note 06/11/2017 4:19 PM  Type of Therapy and Topic: Group Therapy: Feelings around Relapse and Recovery  Participation Level: Did Not Attend   Description of Group:  Patients in this group will discuss emotions they experience before and after a relapse. They will process how experiencing these feelings, or avoidance of experiencing them, relates to having a relapse. Facilitator will guide patients to explore emotions they have related to recovery. Patients will be encouraged to process which emotions are more powerful. They will be guided to discuss the emotional reaction significant others in their lives may have to their relapse or recovery. Patients will be assisted in exploring ways to respond to the emotions of others without this contributing to a relapse.  Therapeutic Goals: 1. Patient will identify two or more emotions that lead to a relapse for them 2. Patient will identify two emotions that result when they relapse 3. Patient will identify two emotions related to recovery 4. Patient will demonstrate ability to communicate their needs through discussion and/or role plays  Summary of Patient Progress:  Invited, chose not to attend.     Therapeutic Modalities:  Cognitive Behavioral Therapy Solution-Focused Therapy Assertiveness Training Relapse Prevention Therapy   Alcario DroughtJolan Kaylon Hitz LCSWA Clinical Social Worker

## 2017-06-11 NOTE — Progress Notes (Signed)
[facility] MD Progress Note  06/11/2017 10:57 AM Jeffrey Velazquez  MRN:  [id number]   Subjective:  Patient reports that he slept pretty good last night, but his morning he has a "stomach ache" and had one episode of diarrhea. He denies any nausea or vomiting. He denies any medication to help with it at this time. He denies any SI/HI/AVH and contracts for safety. He states that he feels "ok, not great" today.  Objective: Patient's chart and findings reviewed and discussed with treatment team. Patient presents lying in his bed. He is pleasant and cooperative. He has been in the day room and interacting, but due to the stomach ache he has remained in his room this morning. Will continue the cross taper and the Effexor-XR to 37.5 mg Daily and the Remeron to 30 mg QHS. Will stop the Effexor-XR after tomorrow's dose.  Principal Problem: MDD (major depressive disorder), severe (HCC) Diagnosis:   Patient Active Problem List   Diagnosis Date Noted  . BPPV (benign paroxysmal positional vertigo) [H81.10] 06/09/2017  . MDD (major depressive disorder), severe (HCC) [F32.2] 06/07/2017  . Diabetes (HCC) [E11.9] 02/21/2017  . HTN (hypertension) [I10] 02/21/2017  . GERD (gastroesophageal reflux disease) [K21.9] 02/21/2017  . Vitamin B12 deficiency [E53.8] 02/21/2017  . Severe episode of recurrent major depressive disorder, with psychotic features (HCC) [F33.3] 02/20/2017  . 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 Chest pain [R07.9] 10/29/2016  . Sinus tachycardia [R00.0] 10/29/2016  . Hyponatremia [E87.1] 10/29/2016  . Hypokalemia [E87.6] 10/29/2016   Total Time spent with patient: 25 minutes  Past Psychiatric History: See H&P  Past Medical History:  Past Medical History:  Diagnosis Date  . Anemia   . Anxiety   . Depression   . Diabetes mellitus without complication (HCC)   . DOE (dyspnea on exertion)   . Early satiety   . Epigastric abdominal pain   . GERD (gastroesophageal reflux disease)    . Hyperlipidemia   . Irritable bowel syndrome   . Shingles   . Tachycardia   . Unintended weight loss     Past Surgical History:  Procedure Laterality Date  . COLONOSCOPY WITH PROPOFOL N/A 11/19/2016   Procedure: COLONOSCOPY WITH PROPOFOL;  Surgeon: Scot Jun, MD;  Location: [facility] ENDOSCOPY;  Service: Endoscopy;  Laterality: N/A;  . ESOPHAGOGASTRODUODENOSCOPY (EGD) WITH PROPOFOL N/A 11/06/2016   Procedure: ESOPHAGOGASTRODUODENOSCOPY (EGD) WITH PROPOFOL;  Surgeon: Scot Jun, MD;  Location: [facility] ENDOSCOPY;  Service: Endoscopy;  Laterality: N/A;  . FRACTURE SURGERY Left 2010   Clavicle  . TONSILLECTOMY    . TOOTH EXTRACTION     wisdom teeth x 4   Family History:  Family History  Problem Relation Age of Onset  . Diabetes Mother   . Heart disease Mother   . Cancer Mother   . CAD Mother   . Heart disease Father   . CAD Father   . Diabetes Sister   . Emphysema Maternal Grandfather   . Diabetes Paternal Grandmother   . Cancer Paternal Grandfather    Family Psychiatric  History: See H&P Social History:  Social History   Substance and Sexual Activity  Alcohol Use No     Social History   Substance and Sexual Activity  Drug Use No    Social History   Socioeconomic History  . Marital status: Married    Spouse name: None  . Number of children: None  . Years of education: None  . Highest education level: None  Social  Needs  . Financial resource strain: None  . Food insecurity - worry: None  . Food insecurity - inability: None  . Transportation needs - medical: None  . Transportation needs - non-medical: None  Occupational History  . None  Tobacco Use  . Smoking status: Never Smoker  . Smokeless tobacco: Never Used  Substance and Sexual Activity  . Alcohol use: No  . Drug use: No  . Sexual activity: Yes  Other Topics Concern  . None  Social History Narrative  . None   Additional Social History:                         Sleep:  Good  Appetite:  Good  Current Medications: Current Facility-Administered Medications  Medication Dose Route Frequency Provider Last Rate Last Dose  . acetaminophen (TYLENOL) tablet 650 mg  650 mg Oral Q6H PRN Money, Gerlene Burdockravis B, FNP      . alum & mag hydroxide-simeth (MAALOX/MYLANTA) 200-200-20 MG/5ML suspension 30 mL  30 mL Oral Q4H PRN Money, Gerlene Burdockravis B, FNP      . aspirin EC tablet 81 mg  81 mg Oral Daily Cobos, Rockey SituFernando A, MD   81 mg at 06/11/17 0904  . feeding supplement (ENSURE ENLIVE) (ENSURE ENLIVE) liquid 237 mL  237 mL Oral BID BM Money, Feliz Beamravis B, FNP   237 mL at 06/09/17 1517  . gabapentin (NEURONTIN) capsule 400 mg  400 mg Oral TID Cobos, Rockey SituFernando A, MD   400 mg at 06/11/17 0905  . hydrOXYzine (ATARAX/VISTARIL) tablet 25 mg  25 mg Oral TID PRN Money, Gerlene Burdockravis B, FNP      . magnesium hydroxide (MILK OF MAGNESIA) suspension 30 mL  30 mL Oral Daily PRN Money, Feliz Beamravis B, FNP      . metFORMIN (GLUCOPHAGE) tablet 1,000 mg  1,000 mg Oral BID WC Cobos, Rockey SituFernando A, MD   1,000 mg at 06/11/17 0903  . mirtazapine (REMERON) tablet 30 mg  30 mg Oral QHS Money, Gerlene Burdockravis B, FNP      . [START ON 06/12/2017] venlafaxine XR (EFFEXOR-XR) 24 hr capsule 37.5 mg  37.5 mg Oral Q breakfast Money, Gerlene Burdockravis B, FNP        Lab Results: No results found for this or any previous visit (from the past 48 hour(s)).  Blood Alcohol level:  Lab Results  Component Value Date   ETH <10 06/05/2017    Metabolic Disorder Labs: Lab Results  Component Value Date   HGBA1C 5.9 (H) 06/09/2017   MPG 122.63 06/09/2017   MPG 139.85 02/20/2017   No results found for: PROLACTIN Lab Results  Component Value Date   CHOL 158 02/20/2017   TRIG 215 (H) 02/20/2017   HDL 45 02/20/2017   CHOLHDL 3.5 02/20/2017   VLDL 43 (H) 02/20/2017   LDLCALC 70 02/20/2017    Physical Findings: AIMS: Facial and Oral Movements Muscles of Facial Expression: None, normal Lips and Perioral Area: None, normal Jaw: None, normal Tongue: None,  normal,Extremity Movements Upper (arms, wrists, hands, fingers): None, normal Lower (legs, knees, ankles, toes): None, normal, Trunk Movements Neck, shoulders, hips: None, normal, Overall Severity Severity of abnormal movements (highest score from questions above): None, normal Incapacitation due to abnormal movements: None, normal Patient's awareness of abnormal movements (rate only patient's report): No Awareness, Dental Status Current problems with teeth and/or dentures?: No Does patient usually wear dentures?: No  CIWA:    COWS:     Musculoskeletal: Strength & Muscle Tone: within  normal limits Gait & Station: normal Patient leans: N/A  Psychiatric Specialty Exam: Physical Exam  Nursing note and vitals reviewed. Constitutional: He is oriented to person, place, and time. He appears well-developed and well-nourished.  Respiratory: Effort normal.  Musculoskeletal: Normal range of motion.  Neurological: He is oriented to person, place, and time.  Skin: Skin is warm.    Review of Systems  Constitutional: Negative.   HENT: Negative.   Eyes: Negative.   Respiratory: Negative.   Cardiovascular: Negative.   Gastrointestinal: Positive for abdominal pain and diarrhea.  Genitourinary: Negative.   Musculoskeletal: Negative.   Skin: Negative.   Neurological: Negative.   Endo/Heme/Allergies: Negative.   Psychiatric/Behavioral: Positive for depression. Negative for hallucinations and suicidal ideas. The patient is nervous/anxious.     Blood pressure 106/72, pulse (!) 103, temperature 97.8 F (36.6 C), temperature source Oral, resp. rate 12, height 5\' 8"  (1.727 m), weight 66.7 kg (147 lb).Body mass index is 22.35 kg/m.  General Appearance: Casual  Eye Contact:  Good  Speech:  Clear and Coherent and Normal Rate  Volume:  Normal  Mood:  Depressed  Affect:  Flat  Thought Process:  Goal Directed and Descriptions of Associations: Intact  Orientation:  Full (Time, Place, and Person)   Thought Content:  WDL  Suicidal Thoughts:  No  Homicidal Thoughts:  No  Memory:  Immediate;   Good Recent;   Good Remote;   Good  Judgement:  Good  Insight:  Good  Psychomotor Activity:  Normal  Concentration:  Concentration: Good and Attention Span: Good  Recall:  Good  Fund of Knowledge:  Good  Language:  Good  Akathisia:  No  Handed:  Right  AIMS (if indicated):     Assets:  Communication Skills Desire for Improvement Financial Resources/Insurance Housing Physical Health Social Support Transportation  ADL's:  Intact  Cognition:  WNL  Sleep:  Number of Hours: 6.75   Problems Addressed: MDD severe  Treatment Plan Summary: Daily contact with patient to assess and evaluate symptoms and progress in treatment, Medication management and Plan is to:  -Decrease Effexor-XR 37.5 mg Daily for mood stability -Increase Remeron 30 mg PO QHS for mood stability -Continue Gabapentin 400 mg PO TID for neuropathic pain -Continue Vistaril 25 mg PO Q6H PRN for anxiety -Encourage group therapy participation  Maryfrances Bunnell, FNP 06/11/2017, 10:57 AM   Signed:Patient seen, Suicide Assessment Completed.  Disposition Plan Reviewed

## 2017-06-11 NOTE — Progress Notes (Signed)
Patient ID: Jeffrey Velazquez, male   DOB: [birth year], 57 y.o.   MRN: [id number]  Pt currently presents with a depressed affect and behavior. Pt attends groups but interacts minimally with peers. Endorses ongoing stomach cramping and diarrhea due to "stress." Pt reports knowledge of upcoming discharge but is unable to voice his discharge plans. Pt reports good sleep with current medication regimen.   Pt provided with medications per providers orders. Pt's labs and vitals were monitored throughout the night. Pt given a 1:1 about emotional and mental status. Pt supported and encouraged to express concerns and questions. Pt educated on medications.  Pt's safety ensured with 15 minute and environmental checks. Pt currently denies SI/HI and A/V hallucinations. Pt verbally agrees to seek staff if SI/HI or A/VH occurs and to consult with staff before acting on any harmful thoughts. Will continue POC.

## 2017-06-11 NOTE — Progress Notes (Signed)
  [facility] :  Will you be returning to the same living situation after discharge:  Yes,  home At discharge, do you have transportation home?: Yes,  wife--pt scheduled for Saturday discharge (06/12/17) Do you have the ability to pay for your medications: Yes,  [employer]  Release of information consent forms completed and submitted to medical records by CSW.  Patient to Follow up at: Follow-up Information    [facility]. Go on 06/16/2017.   Specialty:  Behavioral Health Why:  Please attend your medication appt with Dr. Elna BreslowEappen on Wednesday, 06/16/17, at 10:00am. Bring a list of medications.  Contact information: [address] [facility] [place] [id number] [phone]          Next level of care provider has access to [facility] Link:no  Safety Planning and Suicide Prevention discussed: Yes,  SPE completed with pt and his wife. SPI pamphlet and mobile crisis information provided to pt.   Have you used any form of tobacco in the last 30 days? (Cigarettes, Smokeless Tobacco, Cigars, and/or Pipes): No  Has patient been referred to the Quitline?: N/A patient is not a smoker  Patient has been referred for addiction treatment: Yes  Pulte HomesHeather N Smart, LCSW 06/11/2017, 3:10 PM

## 2017-06-12 NOTE — BHH Group Notes (Signed)
[facility]  Date/Time:    06/12/2017 10:00-11:00AM  Type of Therapy and Topic:  Group Therapy:  Healthy vs Unhealthy Coping Skills  Participation Level:  Active   Description of Group:  The focus of this group was to determine what unhealthy coping techniques typically are used by group members and what healthy coping techniques would be helpful in coping with various problems. Patients were guided in becoming aware of the differences between healthy and unhealthy coping techniques.  Patients were asked to identify 1-2 healthy coping skills they would like to learn to use more effectively, and many mentioned meditation, breathing, and relaxation.  These were explained, samples demonstrated, and resources shared for how to learn more at discharge.   At the end of group, additional ideas of healthy coping skills were shared in a fun exercise.  Therapeutic Goals 1. Patients learned that coping is what human beings do all day long to deal with various situations in their lives 2. Patients defined and discussed healthy vs unhealthy coping techniques 3. Patients identified their preferred coping techniques and identified whether these were healthy or unhealthy 4. Patients determined 1-2 healthy coping skills they would like to become more familiar with and use more often, and practiced a few meditations 5. Patients provided support and ideas to each other  Summary of Patient Progress: During group, patient expressed very little, although he was very attentive throughout group.  He did share he has been married for 34 years, and seemed interested in the topic throughout the hour.   Therapeutic Modalities Cognitive Behavioral Therapy Motivational Interviewing   Ambrose MantleMareida Grossman-Orr, LCSW 06/12/2017, 1:09 PM

## 2017-06-12 NOTE — BHH Suicide Risk Assessment (Deleted)
[facility] Discharge Suicide Risk Assessment   Principal Problem: MDD (major depressive disorder), severe [facility]) Discharge Diagnoses:  Patient Active Problem List   Diagnosis Date Noted  . BPPV (benign paroxysmal positional vertigo) [H81.10] 06/09/2017  . MDD (major depressive disorder), severe (HCC) [F32.2] 06/07/2017  . Diabetes (HCC) [E11.9] 02/21/2017  . HTN (hypertension) [I10] 02/21/2017  . GERD (gastroesophageal reflux disease) [K21.9] 02/21/2017  . Vitamin B12 deficiency [E53.8] 02/21/2017  . Severe episode of recurrent major depressive disorder, with psychotic features (HCC) [F33.3] 02/20/2017  . 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 Chest pain [R07.9] 10/29/2016  . Sinus tachycardia [R00.0] 10/29/2016  . Hyponatremia [E87.1] 10/29/2016  . Hypokalemia [E87.6] 10/29/2016    Total Time spent with patient: 30 minutes  Musculoskeletal: Strength & Muscle Tone: within normal limits Gait & Station: normal Patient leans: N/A  Psychiatric Specialty Exam: ROS no headache, no chest pain, no shortness of breath, reports chronic , intermittent neuropathic pain, mild nausea, but now resolved, no vomiting   Blood pressure 115/73, pulse (!) 109, temperature 99.1 F (37.3 C), temperature source Oral, resp. rate 12, height 5\' 8"  (1.727 m), weight 66.7 kg (147 lb).Body mass index is 22.35 kg/m.  General Appearance: improved grooming   Eye Contact::  Good  Speech:  Normal Rate409  Volume:  Normal  Mood:  improving mood , states he feels better   Affect:  more reactive   Thought Process:  Linear and Descriptions of Associations: Intact  Orientation:  Full (Time, Place, and Person)  Thought Content:  denies hallucinations , no delusions, not internally preoccupied   Suicidal Thoughts:  No denies any suicidal or self injurious ideations   Homicidal Thoughts:  No denies any homicidal ideations  Memory:  recent and remote grossly intact   Judgement:  Other:  improving   Insight:   improving   Psychomotor Activity:  Normal  Concentration:  Good  Recall:  Good  Fund of Knowledge:Good  Language: Good  Akathisia:  Negative  Handed:  Right  AIMS (if indicated):     Assets:  Communication Skills Desire for Improvement Resilience  Sleep:  Number of Hours: 6.75  Cognition: WNL  ADL's:  Intact   Mental Status Per Nursing Assessment::   On Admission:     Demographic Factors:  57 year old married male, has 4 adult children,  lives with wife, unemployed   Loss Factors: Developed neuropathic pain following episode of Herpes Zoster, lost house he had been living in, currently unemployed   Historical Factors: History of depression, history of prior psychiatric admission in 2018 fr depression. No history of suicide attempts   Risk Reduction Factors:   Sense of responsibility to family, Religious beliefs about death, Living with another person, especially a relative, Positive social support and Positive coping skills or problem solving skills  Continued Clinical Symptoms:  Patient reports improvement compared to admission, and partial but significant improvement in mood- does endorse some residual depression , anxiety. Affect is constricted but reactive . No thought disorder, no suicidal or self injurious ideations, no homicidal or violent ideations, no hallucinations, no delusions, not internally preoccupied. Denies medication side effects- Yesterday had experienced some abdominal discomfort , nausea, which is now resolved- states he feels this may have been related to anxiety. No disruptive or agitated behaviors on unit.   Cognitive Features That Contribute To Risk:  No gross cognitive deficits noted upon discharge. Is alert , attentive, and oriented x 3    Suicide Risk:  Mild:  Suicidal ideation of limited frequency, intensity, duration, and specificity.  There are no identifiable plans, no associated intent, mild dysphoria and related symptoms, good self-control  (both objective and subjective assessment), few other risk factors, and identifiable protective factors, including available and accessible social support.  Follow-up Information    [facility]. Go on 06/16/2017.   Specialty:  Behavioral Health Why:  Please attend your medication appt with Dr. Elna BreslowEappen on Wednesday, 06/16/17, at 10:00am. Bring a list of medications.  Contact information: [address] [facility] [place] [id number] [phone]          Plan Of Care/Follow-up recommendations:  Activity:  as tolerated  Diet:  diabetic diet  Tests:  NA Other:  see below  Patient plans to return home, states wife will pick him up later today Patient has an established PCP , Dr. Cordelia PocheLithavong , for management of medical issues as needed  Craige CottaFernando A Cobos, MD 06/12/2017, 8:08 AM

## 2017-06-12 NOTE — Progress Notes (Signed)
D. Pt presents with a flat affect and depressed behavior. Pt reports having slept fair last night.  Per pt's self inventory, pt rates his depression, hopelessness and anxiety a 5/5/6, respectively. Pt currently denies SI/HI and AVH. Pt states that his goal today is "going home" and to "be more positive".  A. Labs and vitals monitored. Pt compliant with medications. Pt supported emotionally and encouraged to express concerns and ask questions.   R. Pt remains safe with 15 minute checks. Will continue POC.

## 2017-06-12 NOTE — Progress Notes (Signed)
Writer observed patient up in the dayroom watching tv with little to no interaction with peers. Writer spoke with him 1:1 about his day. He reports that has been okay and feels better since he is not taking all that medication when he came in. Writer informed him about his night medication and encouraged him to seek writer or staff if in need of anything. Support given and safety maintained on unit with 15 min checks.

## 2017-06-12 NOTE — Progress Notes (Signed)
[facility] MD Progress Note  06/12/2017 8:55 AM Jeffrey CreekJohn David Velazquez  MRN:  [id number]   Subjective: [facility] Patient reports feeling somewhat more depressed and vaguely anxious today, which he states may be in the context of discharge planning and " returning to the real world". Reports he had some nausea and abdominal discomfort, thinks it may have been related to anxiety,but today these symptoms have resolved .  Denies medication side effects. Currently not describing symptoms of Venlafaxine WDL  Objective: Chart notes reviewed and patient seen. Patient had expressed gradual improvement of mood symptoms and readiness for discharge but today reports some increased symptoms of depression, anxiety and states he had understood discharge would be scheduled for tomorrow/ Sunday. Denies suicidal ideations, and is future oriented. Tends to ruminate about not having a job, unemployment, short term disability, but overall describes a sense of optimism that this upcoming year will be a better one for him. Denies medication side effects, and states he is generally satisfied with current medication regimen and in particular states that one of his goals regarding medications was to " take less of them" and be on a more simplified regimen. Thus far tolerating Remeron trial well. Denies suicidal ideations   Principal Problem: MDD (major depressive disorder), severe (HCC) Diagnosis:   Patient Active Problem List   Diagnosis Date Noted  . BPPV (benign paroxysmal positional vertigo) [H81.10] 06/09/2017  . MDD (major depressive disorder), severe (HCC) [F32.2] 06/07/2017  . Diabetes (HCC) [E11.9] 02/21/2017  . HTN (hypertension) [I10] 02/21/2017  . GERD (gastroesophageal reflux disease) [K21.9] 02/21/2017  . Vitamin B12 deficiency [E53.8] 02/21/2017  . Severe episode of recurrent major depressive disorder, with psychotic features (HCC) [F33.3] 02/20/2017  . 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 Chest pain [R07.9]  10/29/2016  . Sinus tachycardia [R00.0] 10/29/2016  . Hyponatremia [E87.1] 10/29/2016  . Hypokalemia [E87.6] 10/29/2016   Total Time spent with patient: 20 minutes  Past Psychiatric History: See H&P  Past Medical History:  Past Medical History:  Diagnosis Date  . Anemia   . Anxiety   . Depression   . Diabetes mellitus without complication (HCC)   . DOE (dyspnea on exertion)   . Early satiety   . Epigastric abdominal pain   . GERD (gastroesophageal reflux disease)   . Hyperlipidemia   . Irritable bowel syndrome   . Shingles   . Tachycardia   . Unintended weight loss     Past Surgical History:  Procedure Laterality Date  . COLONOSCOPY WITH PROPOFOL N/A 11/19/2016   Procedure: COLONOSCOPY WITH PROPOFOL;  Surgeon: Scot JunElliott, Robert T, MD;  [facility];  Service: Endoscopy;  Laterality: N/A;  . ESOPHAGOGASTRODUODENOSCOPY (EGD) WITH PROPOFOL N/A 11/06/2016   Procedure: ESOPHAGOGASTRODUODENOSCOPY (EGD) WITH PROPOFOL;  Surgeon: Scot JunElliott, Robert T, MD;  [facility];  Service: Endoscopy;  Laterality: N/A;  . FRACTURE SURGERY Left 2010   Clavicle  . TONSILLECTOMY    . TOOTH EXTRACTION     wisdom teeth x 4   Family History:  Family History  Problem Relation Age of Onset  . Diabetes Mother   . Heart disease Mother   . Cancer Mother   . CAD Mother   . Heart disease Father   . CAD Father   . Diabetes Sister   . Emphysema Maternal Grandfather   . Diabetes Paternal Grandmother   . Cancer Paternal Grandfather    Family Psychiatric  History: See H&P Social History:  Social History   Substance and Sexual Activity  Alcohol Use No     Social History   Substance and Sexual Activity  Drug Use No    Social History   Socioeconomic History  . Marital status: Married    Spouse name: None  . Number of children: None  . Years of education: None  . Highest education level: None  Social Needs  . Financial resource strain: None  . Food insecurity - worry:  None  . Food insecurity - inability: None  . Transportation needs - medical: None  . Transportation needs - non-medical: None  Occupational History  . None  Tobacco Use  . Smoking status: Never Smoker  . Smokeless tobacco: Never Used  Substance and Sexual Activity  . Alcohol use: No  . Drug use: No  . Sexual activity: Yes  Other Topics Concern  . None  Social History Narrative  . None   Additional Social History:   Sleep: Good  Appetite:  Good  Current Medications: Current Facility-Administered Medications  Medication Dose Route Frequency Provider Last Rate Last Dose  . acetaminophen (TYLENOL) tablet 650 mg  650 mg Oral Q6H PRN Money, Gerlene Burdock, FNP      . alum & mag hydroxide-simeth (MAALOX/MYLANTA) 200-200-20 MG/5ML suspension 30 mL  30 mL Oral Q4H PRN Money, Gerlene Burdock, FNP   30 mL at 06/11/17 2155  . aspirin EC tablet 81 mg  81 mg Oral Daily Cobos, Rockey Situ, MD   81 mg at 06/12/17 0831  . feeding supplement (ENSURE ENLIVE) (ENSURE ENLIVE) liquid 237 mL  237 mL Oral BID BM Money, Feliz Beam B, FNP   237 mL at 06/11/17 1513  . gabapentin (NEURONTIN) capsule 400 mg  400 mg Oral TID Cobos, Rockey Situ, MD   400 mg at 06/12/17 0831  . hydrOXYzine (ATARAX/VISTARIL) tablet 25 mg  25 mg Oral TID PRN Money, Gerlene Burdock, FNP      . magnesium hydroxide (MILK OF MAGNESIA) suspension 30 mL  30 mL Oral Daily PRN Money, Feliz Beam B, FNP      . metFORMIN (GLUCOPHAGE) tablet 1,000 mg  1,000 mg Oral BID WC Cobos, Rockey Situ, MD   1,000 mg at 06/12/17 0831  . mirtazapine (REMERON) tablet 30 mg  30 mg Oral QHS Money, Travis B, FNP   30 mg at 06/11/17 2150    Lab Results: No results found for this or any previous visit (from the past 48 hour(s)).  Blood Alcohol level:  Lab Results  Component Value Date   ETH <10 06/05/2017    Metabolic Disorder Labs: Lab Results  Component Value Date   HGBA1C 5.9 (H) 06/09/2017   MPG 122.63 06/09/2017   MPG 139.85 02/20/2017   No results found for:  PROLACTIN Lab Results  Component Value Date   CHOL 158 02/20/2017   TRIG 215 (H) 02/20/2017   HDL 45 02/20/2017   CHOLHDL 3.5 02/20/2017   VLDL 43 (H) 02/20/2017   LDLCALC 70 02/20/2017    Physical Findings: AIMS: Facial and Oral Movements Muscles of Facial Expression: None, normal Lips and Perioral Area: None, normal Jaw: None, normal Tongue: None, normal,Extremity Movements Upper (arms, wrists, hands, fingers): None, normal Lower (legs, knees, ankles, toes): None, normal, Trunk Movements Neck, shoulders, hips: None, normal, Overall Severity Severity of abnormal movements (highest score from questions above): None, normal Incapacitation due to abnormal movements: None, normal Patient's awareness of abnormal movements (rate only patient's report): No Awareness, Dental Status Current problems with teeth and/or dentures?: No Does patient usually wear dentures?: No  CIWA:    COWS:     Musculoskeletal: Strength & Muscle Tone: within normal limits Gait & Station: normal Patient leans: N/A  Psychiatric Specialty Exam: Physical Exam  Nursing note and vitals reviewed. Constitutional: He is oriented to person, place, and time. He appears well-developed and well-nourished.  Respiratory: Effort normal.  Musculoskeletal: Normal range of motion.  Neurological: He is oriented to person, place, and time.  Skin: Skin is warm.    Review of Systems  Constitutional: Negative.   HENT: Negative.   Eyes: Negative.   Respiratory: Negative.   Cardiovascular: Negative.   Gastrointestinal: Positive for abdominal pain and diarrhea.  Genitourinary: Negative.   Musculoskeletal: Negative.   Skin: Negative.   Neurological: Negative.   Endo/Heme/Allergies: Negative.   Psychiatric/Behavioral: Positive for depression. Negative for hallucinations and suicidal ideas. The patient is nervous/anxious.   reports nausea is improved,  Denies vomiting , no diarrhea, no fever   Blood pressure 115/73,  pulse (!) 109, temperature 99.1 F (37.3 C), temperature source Oral, resp. rate 12, height 5\' 8"  (1.727 m), weight 66.7 kg (147 lb).Body mass index is 22.35 kg/m.  General Appearance: Fairly Groomed  Eye Contact:  Good  Speech:  Normal Rate  Volume:  Decreased  Mood:  partially improved mood, but states he feels more depressed today  Affect:  constricted, anxious, improves partially during session  Thought Process:  Linear and Descriptions of Associations: Intact  Orientation:  Full (Time, Place, and Person)  Thought Content:  no hallucinations, no delusions, not internally preoccupied   Suicidal Thoughts:  No denies suicidal or self injurious ideations, denies homicidal or violent ideations, contracts for safety on unit   Homicidal Thoughts:  No  Memory:  recent and remote grossly intact   Judgement:  Other:  improving   Insight:  improving   Psychomotor Activity:  Decreased  Concentration:  Concentration: Good and Attention Span: Good  Recall:  Good  Fund of Knowledge:  Good  Language:  Good  Akathisia:  No  Handed:  Right  AIMS (if indicated):     Assets:  Communication Skills Desire for Improvement Financial Resources/Insurance Housing Physical Health Social Support Transportation  ADL's:  Intact  Cognition:  WNL  Sleep:  Number of Hours: 6.75   Assessment - patient endorses improvement compared to how he felt prior to admission, but today reports some increased depression and anxiety  in the context of discharge planning . Reports intermittent nausea related to anxiety, but no vomiting . Does feel that Remeron is helping and has tolerated Effexor XR taper well thus far .   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  Craige Cotta, MD 06/12/2017, 8:55 AMPatient ID: Jeffrey Velazquez, male   DOB: September 04, 1960, 57 y.o.   MRN: [id number]

## 2017-06-13 MED ORDER — METFORMIN HCL 1000 MG PO TABS: 1000.0000 mg | ORAL_TABLET | Freq: Two times a day (BID) | ORAL | 0 refills | Status: DC

## 2017-06-13 MED ORDER — MIRTAZAPINE 30 MG PO TABS: 30.0000 mg | ORAL_TABLET | Freq: Every day | ORAL | 0 refills | Status: DC

## 2017-06-13 MED ORDER — GABAPENTIN 400 MG PO CAPS: 400.0000 mg | ORAL_CAPSULE | Freq: Three times a day (TID) | ORAL | 0 refills | Status: DC

## 2017-06-13 MED ORDER — HYDROXYZINE HCL 25 MG PO TABS: 25.0000 mg | ORAL_TABLET | Freq: Three times a day (TID) | ORAL | 0 refills | Status: DC | PRN

## 2017-06-13 NOTE — Progress Notes (Signed)
D) Pt being discharged to home accompanied by his wife. Affect and mood are appropriate. Pt denies SI and HI. Rated his depression at a 3, hopelessness at a 3 and his anxiety at a 4. Denies SI and HI. A) All of pt's belongings returned to him. Pt given the AVS, SRA, SSP and transition record. All discharged plans explained to Pt. All medications explained. Pt provided with a 1:1 R) Pt denies SI and HI.

## 2017-06-13 NOTE — [facility] Group Notes (Signed)
[facility] LCSW Group Therapy Note  Date/Time:  06/13/2017  10:00-11:00AM  Type of Therapy and Topic:  Group Therapy:  Music and Mood  Participation Level:  Did Not Attend   Description of Group: In this process group, members listened to a variety of genres of music and identified that different types of music evoke different responses.  Patients were encouraged to identify music that was soothing for them and music that was energizing for them.  Patients discussed how this knowledge can help with wellness and recovery in various ways including managing depression and anxiety as well as encouraging healthy sleep habits.    Therapeutic Goals: 1. Patients will explore the impact of different varieties of music on mood 2. Patients will verbalize the thoughts they have when listening to different types of music 3. Patients will identify music that is soothing to them as well as music that is energizing to them 4. Patients will discuss how to use this knowledge to assist in maintaining wellness and recovery 5. Patients will explore the use of music as a coping skill  Summary of Patient Progress:  N/A  Therapeutic Modalities: Solution Focused Brief Therapy Activity   Agnes Brightbill Grossman-Orr, LCSW     

## 2017-06-13 NOTE — [facility] Suicide Risk Assessment (Addendum)
[facility] Discharge Suicide Risk Assessment   Principal Problem: MDD (major depressive disorder), severe [facility]) Discharge Diagnoses:  Patient Active Problem List   Diagnosis Date Noted  . BPPV (benign paroxysmal positional vertigo) [H81.10] 06/09/2017  . MDD (major depressive disorder), severe ([place]) [F32.2] 06/07/2017  . Diabetes ([place]) [E11.9] 02/21/2017  . HTN (hypertension) [I10] 02/21/2017  . GERD (gastroesophageal reflux disease) [K21.9] 02/21/2017  . Vitamin B12 deficiency [E53.8] 02/21/2017  . Severe episode of recurrent major depressive disorder, with psychotic features ([place]) [F33.3] 02/20/2017  . 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 Chest pain [R07.9] 10/29/2016  . Sinus tachycardia [R00.0] 10/29/2016  . Hyponatremia [E87.1] 10/29/2016  . Hypokalemia [E87.6] 10/29/2016    Total Time spent with patient: 30 minutes  Musculoskeletal: Strength & Muscle Tone: within normal limits Gait & Station: normal Patient leans: N/A  Psychiatric Specialty Exam: ROS  Denies headache, no chest pain  ( does have chronic pain on L thoracic area which he states is related to neuropathy following episode of  Herpes Zoster ). Denies nausea or vomiting. No fever.   Blood pressure 96/66, pulse (!) 102, temperature 98 F (36.7 C), resp. rate 18, height '5\' 8"'  (1.727 m), weight 66.7 kg (147 lb).Body mass index is 22.35 kg/m.  General Appearance: improved grooming   Eye Contact::  Good  Speech:  Normal Rate409  Volume:  Normal  Mood:  reports mood is partially improved, less depressed   Affect:  mildly constricted, but reactive   Thought Process:  Linear and Descriptions of Associations: Intact  Orientation:  Other:  fully alert and attentive   Thought Content:  no hallucinations, no delusions, not internally preoccupied  Suicidal Thoughts:  No denies any suicidal ideations, denies any self injurious ideations, denies any homicidal ideations   Homicidal Thoughts:  No  Memory:  recent and  remote grossly intact   Judgement:  Other:  improving   Insight:  improving   Psychomotor Activity:  Normal  Concentration:  Good  Recall:  Negative  Fund of Knowledge:Good  Language: Good  Akathisia:  Negative  Handed:  Right  AIMS (if indicated):     Assets:  Communication Skills Desire for Improvement Resilience  Sleep:  Number of Hours: 6.75  Cognition: WNL  ADL's:  Intact   Mental Status Per Nursing Assessment::   On Admission:     Demographic Factors:  57 year old married male , 4 adult children. Lives with wife, he is currently unemployed   Loss Factors: medical issues- history of Shingles , neuropathic pain. Unemployment. Financial concerns  Historical Factors: history of depression, no history of suicide attempts . No prior psychiatric admissions   Risk Reduction Factors:   Sense of responsibility to family, Living with another person, especially a relative and Positive coping skills or problem solving skills  Continued Clinical Symptoms:  Patient reports partial improvement of mood , states he feels less depressed than before admission. Affect remains constricted, but smiles at times appropriately. No thought disorder. He denies suicidal or self injurious ideations. Denies homicidal ideations. No psychotic symptoms. Is future oriented, and states he is planning on applying for long term disability . No disruptive or agitated behaviors on unit . Cooperative on approach. Denies medication side effects.   At patient and his wife's request I met with them together prior to discharge- wife corroborates that his medical illness and current financial stressors have been significant contributors to his depression, and that although they are currently under a lot of  financial stress, she is hopeful that situation will improve. 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 Wife offered her support in navigating this process with patient.  Wife reports he presents improved compared to admission and is in agreement with his discharging  today.  Cognitive Features That Contribute To Risk:  No gross cognitive deficits noted upon discharge. Is alert , attentive, and oriented x 3    Suicide Risk:  Mild:  Suicidal ideation of limited frequency, intensity, duration, and specificity.  There are no identifiable plans, no associated intent, mild dysphoria and related symptoms, good self-control (both objective and subjective assessment), few other risk factors, and identifiable protective factors, including available and accessible social support.  Follow-up Information    [facility]. Go on 06/16/2017.   Specialty:  Behavioral Health Why:  Please attend your medication appt with Dr. Shea Evans on Wednesday, 06/16/17, at 10:00am. Bring a list of medications.  Contact information: [address] [place] [phone]          Plan Of Care/Follow-up recommendations:  Activity:  as tolerated  Diet:  Heart healthy, diabetic diet  Tests:  NA Other:  See below  Patient is expressing readiness for discharge- states wife is picking him up later today Plans to follow up as above  Has an established PCP , Dr. Richarda Overlie  , for management of medical issues as needed .  Jenne Campus, MD 06/13/2017, 11:31 AM
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
## 2017-06-16 ENCOUNTER — Other Ambulatory Visit: Payer: Self-pay

## 2017-06-16 ENCOUNTER — Encounter: Payer: Self-pay | Admitting: Psychiatry

## 2017-06-16 ENCOUNTER — Ambulatory Visit (INDEPENDENT_AMBULATORY_CARE_PROVIDER_SITE_OTHER): Payer: 59 | Admitting: Psychiatry

## 2017-06-16 VITALS — BP 132/87 | HR 102 | Temp 97.6°F | Wt 150.8 lb

## 2017-06-16 DIAGNOSIS — F331 Major depressive disorder, recurrent, moderate: Secondary | ICD-10-CM | POA: Diagnosis not present

## 2017-06-16 MED ORDER — HYDROXYZINE HCL 25 MG PO TABS: 25.0000 mg | ORAL_TABLET | Freq: Three times a day (TID) | ORAL | 0 refills | Status: DC | PRN

## 2017-06-16 MED ORDER — MIRTAZAPINE 30 MG PO TABS: 30.0000 mg | ORAL_TABLET | Freq: Every day | ORAL | 0 refills | Status: DC

## 2017-06-16 NOTE — Progress Notes (Signed)
Psychiatric Initial Adult Assessment   Patient Identification: Jeffrey Velazquez MRN:  [id number] Date of Evaluation:  06/16/2017 Referral Source: Dr.Linthavong Chief Complaint:  ' I was just discharged from [facility].'  Chief Complaint    Establish Care; Medication Problem; Depression; Fatigue     Visit Diagnosis:    ICD-10-CM   1. MDD (major depressive disorder), recurrent episode, moderate (HCC) F33.1 hydrOXYzine (ATARAX/VISTARIL) 25 MG tablet    mirtazapine (REMERON) 30 MG tablet    History of Present Illness: Jeffrey Velazquez is a 57 year old Caucasian male who has a history of depression, medical problems like diabetes, shingles, neurological pain, is married, on short-term disability, lives in [place], presented to the clinic today to establish care.  Jeffrey Velazquez reported that he was recently discharged from behavioral [place] where he was admitted from 06/07/2017 to 06/13/2017.  Patient was admitted for worsening depressive symptoms, lack of appetite and other neurovegetative symptoms.  Patient was stabilized on medications like mirtazapine and hydroxyzine prior to discharge.  Patient reports he is compliant on his medications at this time.  He denies any depressive symptoms or sleep issues or appetite problems at this time.  He also continue to follow-up with Ms. Jeffrey Velazquez for counseling every week.  Patient reported he was in his usual state of health until December 2017.  He reports that in 2017 he developed shingles which spread inside his GI system and caused small intestinal inflammation.  Patient reported that he had several provider and specialist appointments back to back during that time and had several workup done including imaging, CT scans, endoscopy's and so on.  Patient also went through periods when he was tried on medications and had serious adverse effects to these medications which also led to limited quality of life and functioning.  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  Patient reported that after the 12-week FMLA ran out his company let go of him.  Patient hence lost his house and his car because he was the only one working in his family.  Patient reported that it took him a while to get on short-term disability and even now it is from month to month and he does not know what is going to happen to him.  He is currently working with an attorney about going on long-term disability.  Patient reports that because of the financial stressors, the medical problems as well as pain he started getting depressive symptoms.  He also reported that because of his GI issues he could not eat a lot and hence lost a lot of weight.  He continued to struggle with pain on a regular basis.  He was admitted to [facility] on the inpatient unit on 03/02/2017 for depressive symptoms.  Patient was started on medications like olanzapine, Effexor at that time and was discharged.  He reports he was withdrawn most of the time while he was admitted and had to also  cope with his pain which was kind of new to him at that point.  Patient reports he was mistaken for being severely depressed because of being withdrawn and was suggested to get an ECT during his admission.  Patient however improved on his medications and was discharged without ECT treatment.  Patient soon after that again had depressive symptoms, did not follow up with the psychiatrist on an outpatient basis, and got readmitted to [facility] behavioral health.  Patient reports he is currently  coping better than before.  He however reports that its a  day to day struggle for him going back and forth with HR as well as his insurance agency.  He however has hired a Chief Executive Officer at this time to work on his behalf.  He reports he is compliant on medications.  His wife has started a job doing medical coding and that also has been helpful.  He and his wife both live in an  old house that they inherited and that has been helpful too.  He however reports that the house is so old and it needs a lot of reconstruction at this point which is also a stressor.  He has 2 adult sons who are supportive, doing well.  He also has an 55 year old adopted daughter.  He had adopted another 32 year old but she is currently in foster care.  He reported that when he was struggling with all his medical issues as well as financial issues his daughter who was having her own mental health issues started acting out, stole from them and hence she had to be sent to another foster home.  He denies abusing any drugs or alcohol.  He denies any perceptual disturbances.  He denies any panic attacks.  Denies any suicidality or homicidality at this time.  Associated Signs/Symptoms: Depression Symptoms:  depressed mood, anxiety, decreased appetite, (Hypo) Manic Symptoms:  denies Anxiety Symptoms:  worrying Psychotic Symptoms:  denies PTSD Symptoms: Negative  Past Psychiatric History: As mentioned above he has had 2 inpatient admissions, 03/02/2017-[facility], 06/07/2017-[facility] is [name].  Patient denies any suicide attempts.  Patient reports he gets counseling with Jeffrey Velazquez, therapist here in [place].  Previous Psychotropic Medications: Yes -zyprexa, effexor   Substance Abuse History in the last 12 months:  No.  Consequences of Substance Abuse: Negative  Past Medical History:  Past Medical History:  Diagnosis Date  . Anemia   . Anxiety   . Depression   . Diabetes mellitus without complication ([place])   . Diabetes mellitus, type II ([place])   . DOE (dyspnea on exertion)   . Early satiety   . Epigastric abdominal pain   . GERD (gastroesophageal reflux disease)   . Hyperlipidemia   . Irritable bowel syndrome   . Shingles   . Tachycardia   . Unintended weight loss     Past Surgical History:  Procedure Laterality Date  . COLONOSCOPY WITH PROPOFOL N/A 11/19/2016   Procedure: COLONOSCOPY  WITH PROPOFOL;  Surgeon: Manya Silvas, MD;  Location: [facility] ENDOSCOPY;  Service: Endoscopy;  Laterality: N/A;  . ESOPHAGOGASTRODUODENOSCOPY (EGD) WITH PROPOFOL N/A 11/06/2016   Procedure: ESOPHAGOGASTRODUODENOSCOPY (EGD) WITH PROPOFOL;  Surgeon: Manya Silvas, MD;  Location: [facility] ENDOSCOPY;  Service: Endoscopy;  Laterality: N/A;  . FRACTURE SURGERY Left 2010   Clavicle  . TONSILLECTOMY    . TOOTH EXTRACTION     wisdom teeth x 4    Family Psychiatric History: Denies history of mental illness, substance abuse, suicide in his family.  Family History:  Family History  Problem Relation Age of Onset  . Diabetes Mother   . Heart disease Mother   . Cancer Mother   . CAD Mother   . Heart disease Father   . CAD Father   . Diabetes Sister   . Emphysema Maternal Grandfather   . Diabetes Paternal Grandmother   . Cancer Paternal Grandfather     Social History:   Social History   Socioeconomic History  . Marital status: Married    Spouse  name: ladana   . Number of children: 2  . Years of education: None  . Highest education level: High school graduate  Social Needs  . Financial resource strain: Very hard  . Food insecurity - worry: Never true  . Food insecurity - inability: Never true  . Transportation needs - medical: Yes  . Transportation needs - non-medical: Yes  Occupational History    Comment: disabled  Tobacco Use  . Smoking status: Never Smoker  . Smokeless tobacco: Never Used  Substance and Sexual Activity  . Alcohol use: No  . Drug use: No  . Sexual activity: Yes    Birth control/protection: None  Other Topics Concern  . None  Social History Narrative  . None    Additional Social History: He reports he had a good childhood.  He currently lives in [place] with his wife.  He has 2 biological children, adult sons who are doing well.  He has an 72 year old adopted daughter who is on her own now.  She moved out.  He also had a 55 year old adopted daughter who  had to be sent to another foster home last year due to her having behavioral problems.  He is currently on short-term disability.  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  His wife is currently employed doing billing and coding.  Allergies:   Allergies  Allergen Reactions  . Codeine Nausea Only and Other (See Comments)    dizziness  . Trazodone And Nefazodone     Pt states "I can't walk".     Metabolic Disorder Labs: Lab Results  Component Value Date   HGBA1C 5.9 (H) 06/09/2017   MPG 122.63 06/09/2017   MPG 139.85 02/20/2017   No results found for: PROLACTIN Lab Results  Component Value Date   CHOL 158 02/20/2017   TRIG 215 (H) 02/20/2017   HDL 45 02/20/2017   CHOLHDL 3.5 02/20/2017   VLDL 43 (H) 02/20/2017   LDLCALC 70 02/20/2017     Current Medications: Current Outpatient Medications  Medication Sig Dispense Refill  . aspirin EC 81 MG tablet Take 1 tablet (81 mg total) by mouth daily. 30 tablet 1  . atorvastatin (LIPITOR) 20 MG tablet Take 1 tablet (20 mg total) by mouth daily. 30 tablet 1  . Blood Glucose Monitoring Suppl (ONE TOUCH ULTRA 2) w/Device KIT USE UTD    . gabapentin (NEURONTIN) 300 MG capsule Take by mouth.    Marland Kitchen glimepiride (AMARYL) 2 MG tablet TAKE 1 TABLET BY MOUTH TWICE DAILY WITH MEALS    . hydrOXYzine (ATARAX/VISTARIL) 25 MG tablet Take 1 tablet (25 mg total) by mouth 3 (three) times daily as needed for anxiety. 270 tablet 0  . Lancets 30G MISC by Does not apply route.    . Melatonin 3 MG TABS Take 9 mg by mouth.    . metFORMIN (GLUCOPHAGE-XR) 500 MG 24 hr tablet Take by mouth.    . mirtazapine (REMERON) 30 MG tablet Take 1 tablet (30 mg total) by mouth at bedtime. For mood control 90 tablet 0  . Multiple Vitamin (MULTI-VITAMINS) TABS Take by mouth.     No current facility-administered medications for this visit.     Neurologic: Headache: No Seizure: No Paresthesias:No  Musculoskeletal: Strength & Muscle  Tone: within normal limits Gait & Station: normal Patient leans: N/A  Psychiatric Specialty Exam: Review of Systems  Psychiatric/Behavioral: Positive for depression (improved). The patient is nervous/anxious (situational).   All  other systems reviewed and are negative.   Blood pressure 132/87, pulse (!) 102, temperature 97.6 F (36.4 C), temperature source Oral, weight 150 lb 12.8 oz (68.4 kg).Body mass index is 22.93 kg/m.  General Appearance: Casual  Eye Contact:  Fair  Speech:  Clear and Coherent  Volume:  Normal  Mood:  Depressed  Affect:  Appropriate  Thought Process:  Goal Directed and Descriptions of Associations: Intact  Orientation:  Full (Time, Place, and Person)  Thought Content:  Logical  Suicidal Thoughts:  No  Homicidal Thoughts:  No  Memory:  Immediate;   Fair Recent;   Fair Remote;   Fair  Judgement:  Fair  Insight:  Fair  Psychomotor Activity:  Normal  Concentration:  Concentration: Fair and Attention Span: Fair  Recall:  [employer] of Knowledge:Fair  Language: Fair  Akathisia:  No  Handed:  Right  AIMS (if indicated):  NA  Assets:  Communication Skills Desire for Improvement Housing Social Support  ADL's:  Intact  Cognition: WNL  Sleep:  Fair   Treatment Plan Summary: Marsha is a 57 year old Caucasian male who has a history of depression, which started after the recent situational stressors as well as medical problems.  Patient developed severe pain from shingles which led to him being let go by his company after he took some time off from work.  Patient hence lost his home and his car as well as struggled with financial issues which led to him being depressed.  Patient had 2 back-to-back inpatient mental health admissions in October 2018 and January 2019.  Patient currently reports being stable on his medications.  He also reports he is compliant.  He has good social support from family.  He denies any substance abuse problem .He denies any suicidality at  this time.  He also denied family history of mental health issues or suicide.  He continues to follow-up with Ms. Jeffrey Velazquez for psychotherapy.  He is currently a good candidate for outpatient treatment at this time. Medication management and Plan see below   Plan  For MDD Continue Remeron 30 mg p.o. daily Continue CBT with Ms. Jeffrey Velazquez PHQ 9 =  4.  Insomnia Continue Remeron 30 mg p.o. daily  For anxiety symptoms Remeron helps with anxiety. He is also on Vistaril 25 mg p.o. 3 times daily as needed. He also takes gabapentin 300 mg PO 3 times daily.  He however reports he can get it refilled by his primary care provider since he also takes it for pain. GAD 7 =4  Reviewed most recent labs in [facility] including TSH, vitamin B12, hemoglobin A1c-within normal limits.  Reviewed discharge summary and medical records from [facility].  Provided supportive psychotherapy for patient.  Provided medication education.  Discussed crisis plan.  Follow up in 8 weeks or sooner if needed.  More than 50 % of the time was spent for psychoeducation and supportive psychotherapy and care coordination.  This note was generated in part or whole with voice recognition software. Voice recognition is usually quite accurate but there are transcription errors that can and very often do occur. I apologize for any typographical errors that were not detected and corrected.        Ursula Alert, MD 1/30/201911:30 AM

## 2017-08-12 ENCOUNTER — Ambulatory Visit: Payer: 59 | Admitting: Psychiatry

## 2018-02-07 IMAGING — CR DG CHEST 2V
2 series · 2 of 2 positions shown · non-contrast
Comparison: 10/07/2016.

CLINICAL DATA: Pain across the lower chest and epigastric region.
30 pound weight loss.

EXAM:
CHEST  2 VIEW

[chest pa]
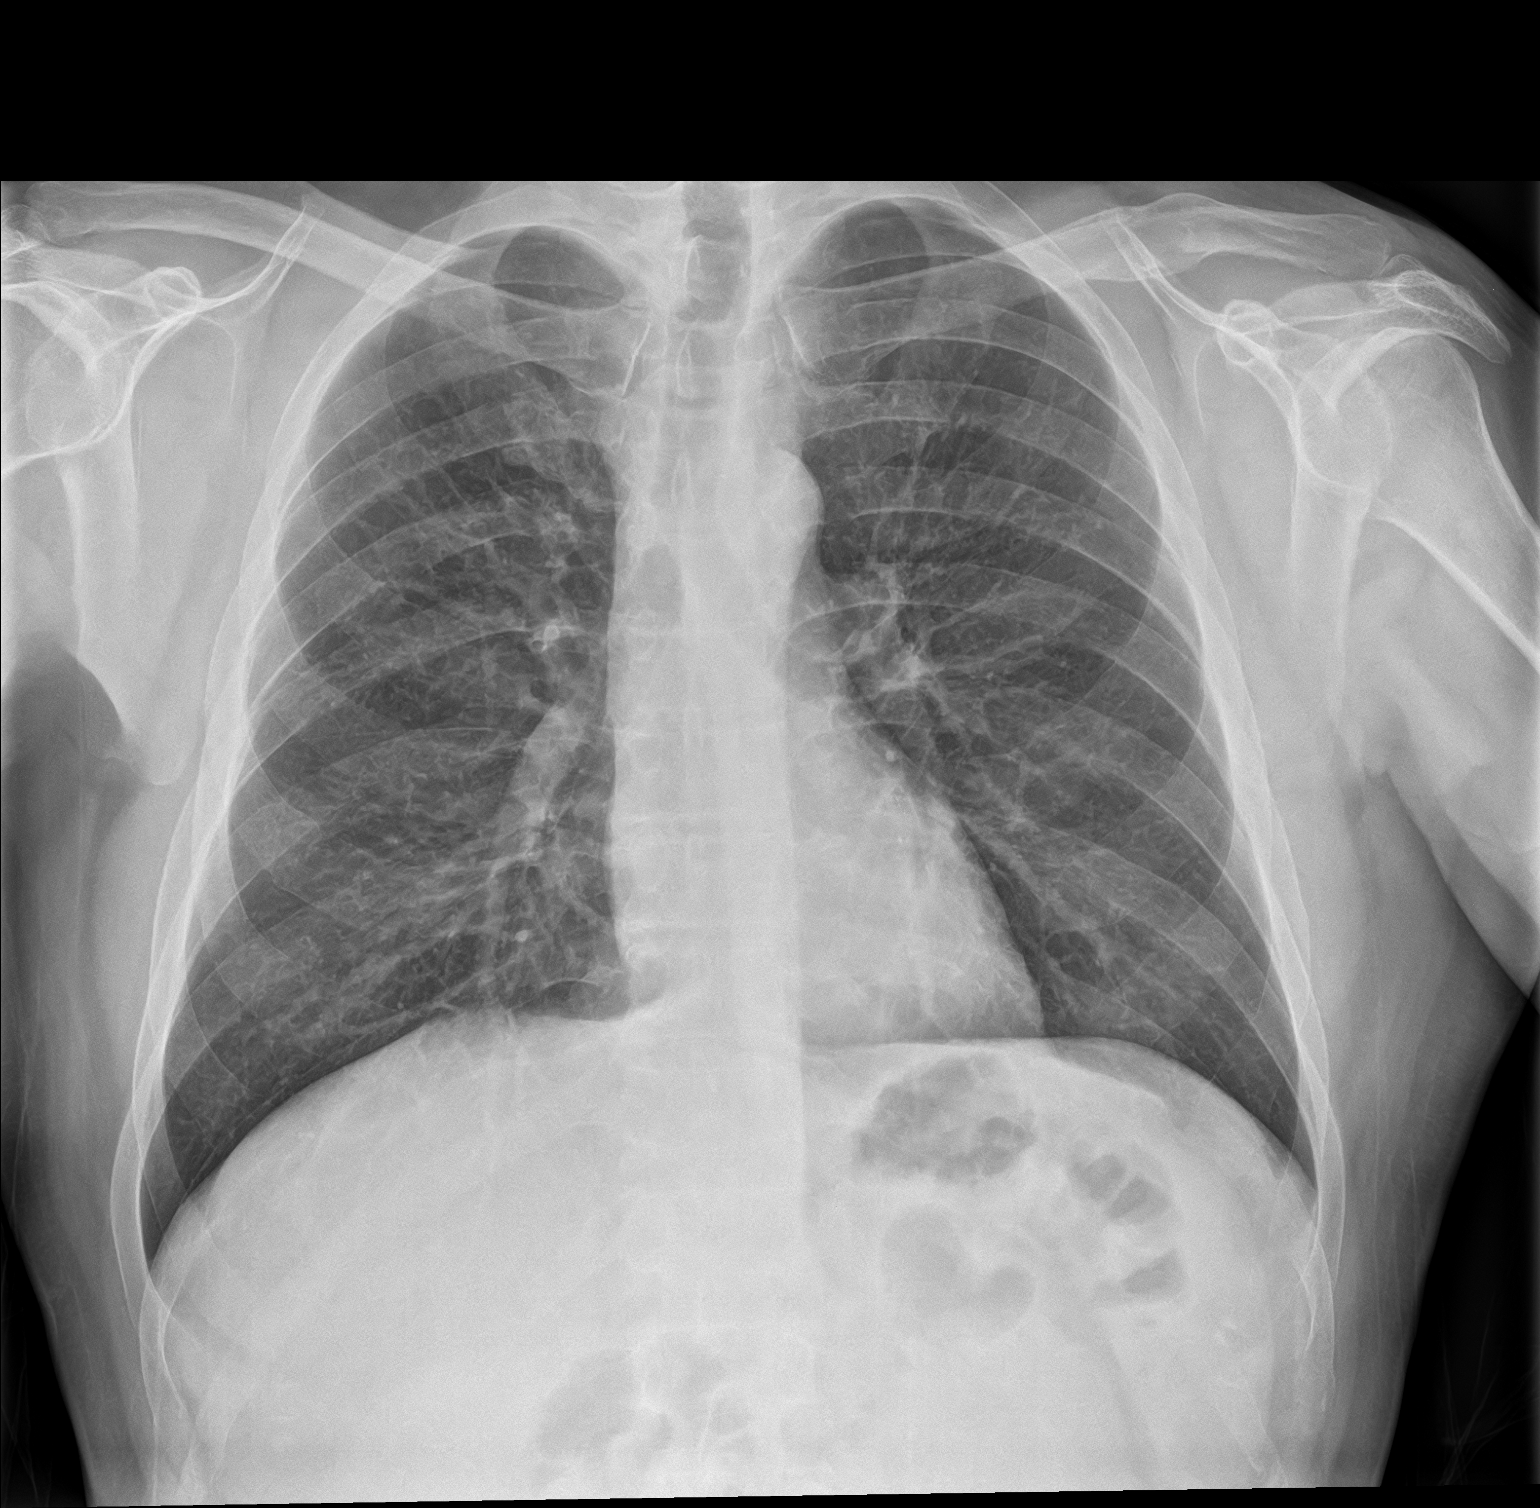

[chest lat]
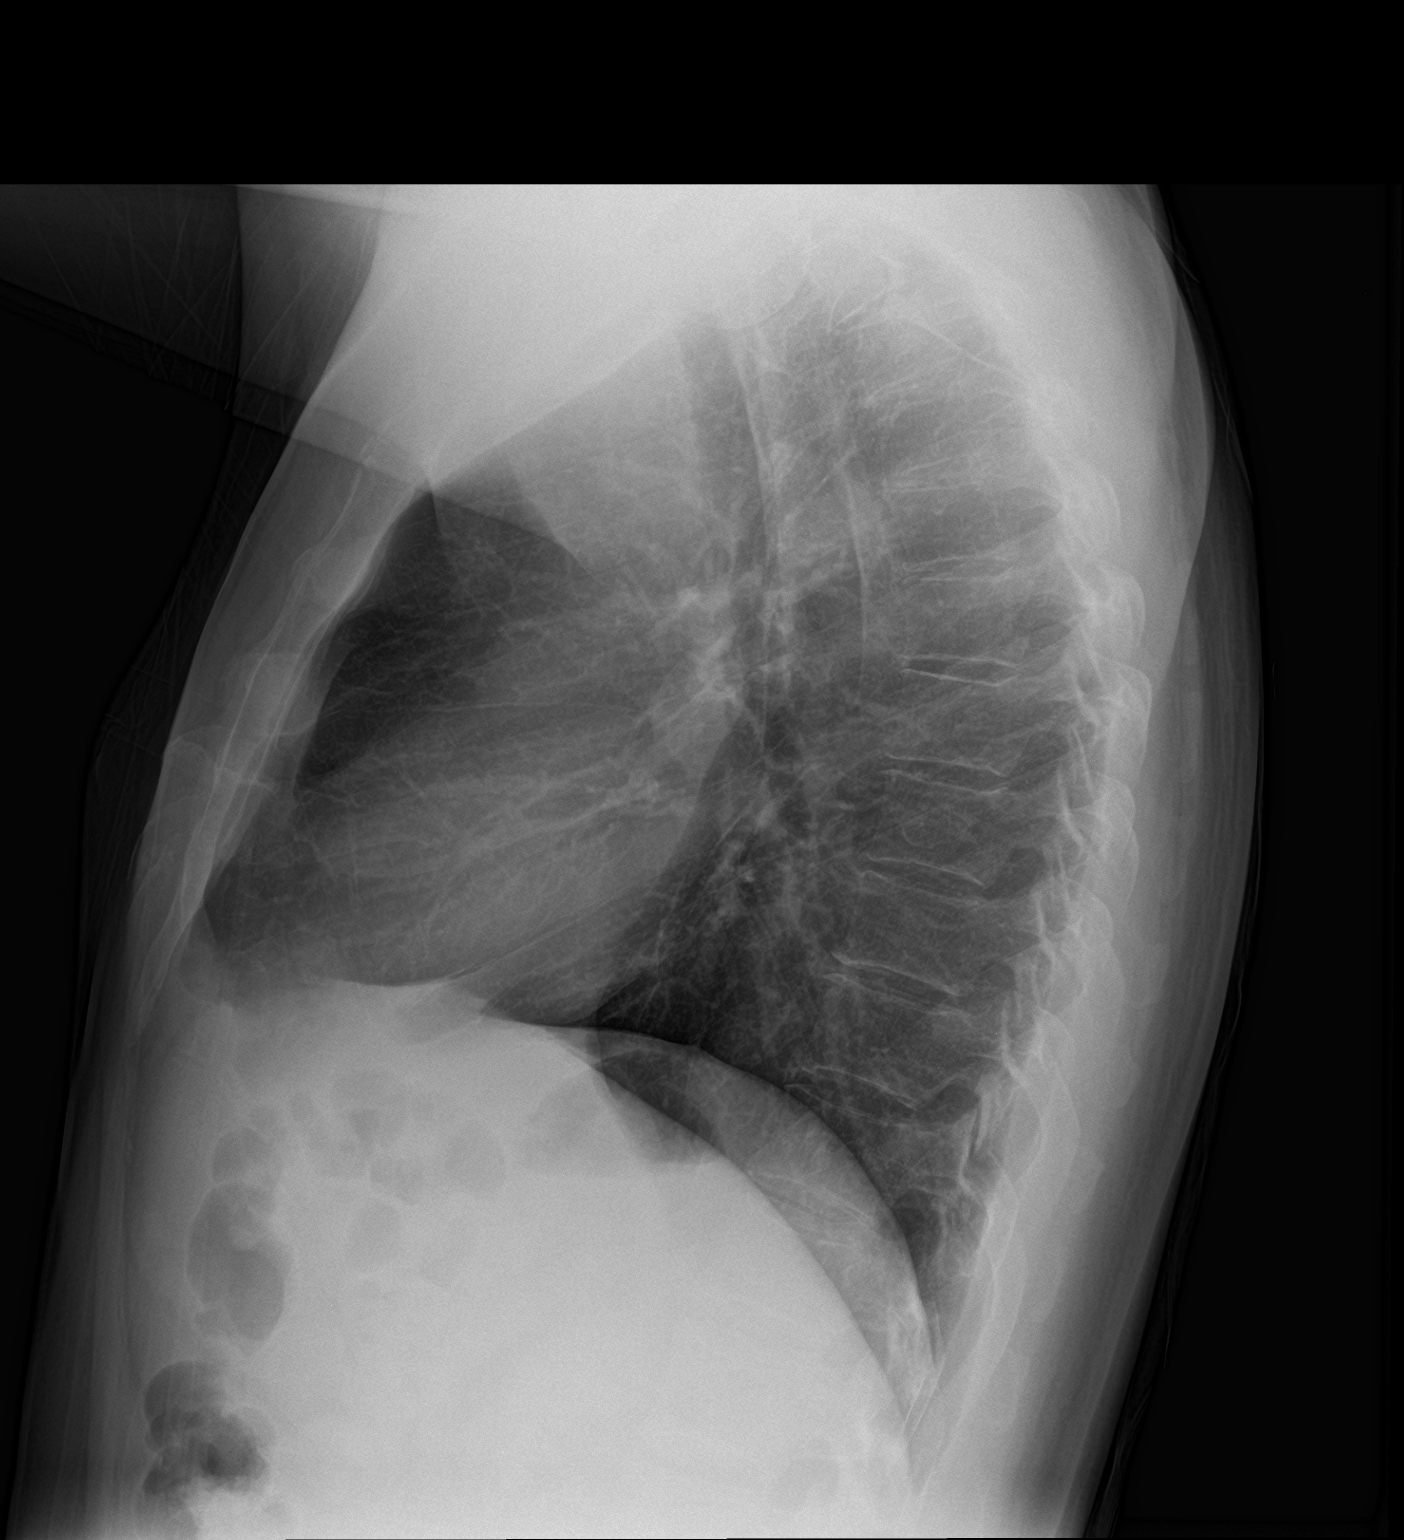

[2 of 2 positions shown; findings below may reference images not displayed]

FINDINGS: Normal cardiomediastinal silhouette. Clear lung fields. No acute
bony abnormality. Old LEFT clavicle fracture. No change from priors.
IMPRESSION: No active disease.

## 2018-04-28 DIAGNOSIS — E119 Type 2 diabetes mellitus without complications: Secondary | ICD-10-CM | POA: Diagnosis not present

## 2018-04-28 DIAGNOSIS — H524 Presbyopia: Secondary | ICD-10-CM | POA: Diagnosis not present

## 2018-04-28 LAB — HM DIABETES EYE EXAM

## 2018-06-20 ENCOUNTER — Encounter: Payer: Self-pay | Admitting: Physician Assistant

## 2018-06-20 ENCOUNTER — Ambulatory Visit: Payer: BLUE CROSS/BLUE SHIELD | Admitting: Physician Assistant

## 2018-06-20 VITALS — BP 134/85 | HR 77 | Temp 98.1°F | Ht 68.0 in | Wt 171.8 lb

## 2018-06-20 DIAGNOSIS — E119 Type 2 diabetes mellitus without complications: Secondary | ICD-10-CM | POA: Diagnosis not present

## 2018-06-20 DIAGNOSIS — Z114 Encounter for screening for human immunodeficiency virus [HIV]: Secondary | ICD-10-CM

## 2018-06-20 DIAGNOSIS — E1169 Type 2 diabetes mellitus with other specified complication: Secondary | ICD-10-CM | POA: Diagnosis not present

## 2018-06-20 DIAGNOSIS — Z23 Encounter for immunization: Secondary | ICD-10-CM | POA: Diagnosis not present

## 2018-06-20 DIAGNOSIS — E785 Hyperlipidemia, unspecified: Secondary | ICD-10-CM

## 2018-06-20 DIAGNOSIS — Z1159 Encounter for screening for other viral diseases: Secondary | ICD-10-CM | POA: Diagnosis not present

## 2018-06-20 LAB — POCT GLYCOSYLATED HEMOGLOBIN (HGB A1C): Hemoglobin A1C: 10.8 % — AB (ref 4.0–5.6)

## 2018-06-20 MED ORDER — METFORMIN HCL ER 500 MG PO TB24: ORAL_TABLET | ORAL | 0 refills | Status: DC

## 2018-06-20 NOTE — Progress Notes (Signed)
Patient: Jeffrey Velazquez, Male    DOB: September 05, 1960, 58 y.o.   MRN: [id number] Visit Date: 06/22/2018  Today's Provider: Trinna Post, PA-C   Chief Complaint  Patient presents with  . Establish Care   Subjective:     New Patient:  Jeffrey Velazquez is a 58 y.o. male who presents today to Establish Care. He was previously seeing Dr. Netty Starring at [facility].  He feels fairly well. He reports exercising lightly. He reports he is sleeping fairly well. 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 [employer]. Previously worked for [employer] for 23 years until he got sick from shingles and required FMLA. Living in [place] with wife of 20 years. Has two biological sons ages 81 and 33, adopted two girls ages 27 and 11, the youngest daughter lives with biological grandmother. No aclohol, drugs, smoking.   Patient had PCP who left town and did not have PCP for a couple of years. Was seen by a PCP at [facility] and reports he has Diabetes but lost insurance so he has not taken medications in a year for his chronic illness.  DM II  Has had diabetes for 1 years. He ws previously on metformin and glimepiride. Last A1c was 6.6% on 06/02/2017. Has been as high as 10.7 in 05/28/2016. Reports he has not taken metformin in 6 months at least. Reports he has been off metformin for 6 months and has experienced alternating diarrhea and constipation even prior to beginning metformin. Glimepiride is on Rx list though patient does not remember taking this.  Lab Results  Component Value Date   HGBA1C 10.8 (A) 06/20/2018     HLD: Previously on Lipitor 20 mg daily. Last LDL was 134 and total was 209 on 06/02/2017 in [facility].   Lipid Panel     Component Value Date/Time   CHOL 245 (H) 06/20/2018 1507   TRIG 302 (H) 06/20/2018 1507   HDL 44 06/20/2018 1507   CHOLHDL 5.6 (H) 06/20/2018 1507   CHOLHDL 3.5 02/20/2017 0729   VLDL 43 (H) 02/20/2017 0729   LDLCALC 141 (H) 06/20/2018  1507     Blood pressure:  Reports he has never been treated for HTN.   BP Readings from Last 3 Encounters:  06/20/18 134/85  06/07/17 125/80  03/12/17 110/74   Depression: Saw Dr. Shea Evans last year for depression. Recently had a behavioral health admission from [facility] and was admitted for depression on 06/07/2017 through 06/13/2017. Prior behavioral health admission 01/2017. Patient was started on remeron and hydroxyzine during these admissions. Was in counseling at the time with Arcola Jansky. He relates his depression to long course of uncertainty relating to post herpetic neuralgia. Endorses what he describes to be normal and intermittent feelings of sadness. Is not taking any depression medication now. Would consider going back to counseling.  Post herpetic neuralgia: Gabapentin and tegretol through Dr. Manuella Ghazi at [facility] neurology. Previously took 300 mg TID gabapentin from PCP but was increased to 3600 mg with Dr. Manuella Ghazi, previously on tegretol. He describes a prolonged course of morbidity following shingles in 2018. Reports persistent abdominal pain, had early satiety and weight loss. This was initially thought to be gastroparesis and he was treated with reglan. Eventually he saw GI specialist and this was discontinued: had negative endoscopy, colonoscopy and gastric emptying study. This eventually went away but he was still left with abdominal pain. Continues to experience this though is not overly bothered by it, does not  wish to return to neurology for this.   -----------------------------------------------------------------   Review of Systems  Constitutional: Positive for fatigue.  HENT: Positive for dental problem and hearing loss.   Eyes: Negative.   Respiratory: Negative.   Cardiovascular: Negative.   Gastrointestinal: Negative.   Endocrine: Negative.   Genitourinary: Negative.   Musculoskeletal: Negative.   Skin: Negative.   Allergic/Immunologic: Negative.   Neurological:  Negative.   Hematological: Negative.   Psychiatric/Behavioral: Negative.     Social History      He  reports that he has never smoked. He has never used smokeless tobacco. He reports that he does not drink alcohol or use drugs.       Social History   Socioeconomic History  . Marital status: Married    Spouse name: ladana   . Number of children: 2  . Years of education: Not on file  . Highest education level: High school graduate  Occupational History    Comment: disabled  Social Needs  . Financial resource strain: Very hard  . Food insecurity:    Worry: Never true    Inability: Never true  . Transportation needs:    Medical: Yes    Non-medical: Yes  Tobacco Use  . Smoking status: Never Smoker  . Smokeless tobacco: Never Used  Substance and Sexual Activity  . Alcohol use: No  . Drug use: No  . Sexual activity: Yes    Birth control/protection: None  Lifestyle  . Physical activity:    Days per week: 7 days    Minutes per session: 10 min  . Stress: Rather much  Relationships  . Social connections:    Talks on phone: Never    Gets together: Never    Attends religious service: Never    Active member of club or organization: No    Attends meetings of clubs or organizations: Never    Relationship status: Married  Other Topics Concern  . Not on file  Social History Narrative  . Not on file    Past Medical History:  Diagnosis Date  . Anemia   . Anxiety   . Depression   . Diabetes mellitus without complication ([place])   . Diabetes mellitus, type II ([place])   . DOE (dyspnea on exertion)   . Early satiety   . Epigastric abdominal pain   . GERD (gastroesophageal reflux disease)   . Hyperlipidemia   . Irritable bowel syndrome   . Shingles   . Tachycardia   . Unintended weight loss      Patient Active Problem List   Diagnosis Date Noted  . BPPV (benign paroxysmal positional vertigo) 06/09/2017  . MDD (major depressive disorder), severe ([place]) 06/07/2017  .  Moderate episode of recurrent major depressive disorder ([place]) 03/10/2017  . Diabetes ([place]) 02/21/2017  . HTN (hypertension) 02/21/2017  . GERD (gastroesophageal reflux disease) 02/21/2017  . Vitamin B12 deficiency 02/21/2017  . Severe episode of recurrent major depressive disorder, with psychotic features ([place]) 02/20/2017  . Insomnia due to mental disorder   . Anxiety with somatization 11/13/2016  . Pain, generalized 11/05/2016  . Chest pain 10/29/2016  . Sinus tachycardia 10/29/2016  . Hyponatremia 10/29/2016  . Hypokalemia 10/29/2016  . Diarrhea, unspecified 10/26/2016  . DOE (dyspnea on exertion) 10/13/2016  . Early satiety 10/13/2016  . Epigastric pain 10/13/2016  . Unintended weight loss 10/13/2016  . Post herpetic neuralgia 07/23/2016  . Hyperlipemia, mixed 06/08/2016  . Type 2 diabetes mellitus without complication, without long-term current use of  insulin ([place]) 06/08/2016  . Vaccine counseling 05/21/2016    Past Surgical History:  Procedure Laterality Date  . COLONOSCOPY WITH PROPOFOL N/A 11/19/2016   Procedure: COLONOSCOPY WITH PROPOFOL;  Surgeon: Manya Silvas, MD;  Location: [facility];  Service: Endoscopy;  Laterality: N/A;  . ESOPHAGOGASTRODUODENOSCOPY (EGD) WITH PROPOFOL N/A 11/06/2016   Procedure: ESOPHAGOGASTRODUODENOSCOPY (EGD) WITH PROPOFOL;  Surgeon: Manya Silvas, MD;  Location: [facility] ENDOSCOPY;  Service: Endoscopy;  Laterality: N/A;  . FRACTURE SURGERY Left 2010   Clavicle  . TONSILLECTOMY    . TOOTH EXTRACTION     wisdom teeth x 4  . VASECTOMY      Family History        Family Status  Relation Name Status  . Mother  Deceased  . Father  Deceased  . Sister  Alive  . MGF  (Not Specified)  . PGM  (Not Specified)  . PGF  (Not Specified)       skin cancer  . Brother  Alive  . Sister  Alive  . Sister  Alive        His family history includes CAD in his father and mother; COPD in his mother; Cancer in his mother and paternal grandfather;  Diabetes in his mother, paternal grandmother, and sister; Emphysema in his maternal grandfather; Heart disease in his father and mother.      Allergies  Allergen Reactions  . Codeine Nausea Only and Other (See Comments)    dizziness  . Trazodone And Nefazodone     Pt states "I can't walk".      Current Outpatient Medications:  .  atorvastatin (LIPITOR) 20 MG tablet, Take 1 tablet (20 mg total) by mouth daily. (Patient not taking: Reported on 06/20/2018), Disp: 30 tablet, Rfl: 1 .  Blood Glucose Monitoring Suppl (ONE TOUCH ULTRA 2) w/Device KIT, USE UTD, Disp: , Rfl:  .  Lancets 30G MISC, by Does not apply route., Disp: , Rfl:  .  Melatonin 3 MG TABS, Take 9 mg by mouth., Disp: , Rfl:  .  metFORMIN (GLUCOPHAGE XR) 500 MG 24 hr tablet, 500 mg 1x daily x 7 days. 500 mg 2x daily for 7 days. 1000 mg in the morning and 500 mg at night x 7 days. 1000 mg twice daily onward., Disp: 90 tablet, Rfl: 0 .  Multiple Vitamin (MULTI-VITAMINS) TABS, Take by mouth., Disp: , Rfl:    Patient Care Team: Paulene Floor as PCP - General (Physician Assistant)    Objective:    Vitals: BP 134/85 (BP Location: Right Arm, Patient Position: Sitting, Cuff Size: Normal)   Pulse 77   Temp 98.1 F (36.7 C) (Oral)   Ht '5\' 8"'$  (1.727 m)   Wt 171 lb 12.8 oz (77.9 kg)   SpO2 99%   BMI 26.12 kg/m    Vitals:   06/20/18 1358  BP: 134/85  Pulse: 77  Temp: 98.1 F (36.7 C)  TempSrc: Oral  SpO2: 99%  Weight: 171 lb 12.8 oz (77.9 kg)  Height: '5\' 8"'$  (1.727 m)     Physical Exam Constitutional:      Appearance: Normal appearance.  HENT:     Right Ear: Tympanic membrane and ear canal normal.     Left Ear: Tympanic membrane and ear canal normal.  Cardiovascular:     Rate and Rhythm: Normal rate and regular rhythm.     Heart sounds: Normal heart sounds.  Pulmonary:     Effort: Pulmonary effort is normal.  Breath sounds: Normal breath sounds.  Abdominal:     General: Abdomen is flat. Bowel sounds  are normal.  Skin:    General: Skin is warm and dry.  Neurological:     Mental Status: He is alert and oriented to person, place, and time. Mental status is at baseline.  Psychiatric:        Mood and Affect: Mood normal.        Behavior: Behavior normal.     Diabetic Foot Exam - Simple   Simple Foot Form Diabetic Foot exam was performed with the following findings:  Yes 06/20/2018  2:37 PM  Visual Inspection No deformities, no ulcerations, no other skin breakdown bilaterally:  Yes Sensation Testing Intact to touch and monofilament testing bilaterally:  Yes Pulse Check Posterior Tibialis and Dorsalis pulse intact bilaterally:  Yes Comments      Depression Screen PHQ 2/9 Scores 06/20/2018  PHQ - 2 Score 2  PHQ- 9 Score 5       Assessment & Plan:     Routine Health Maintenance and Physical Exam  Exercise Activities and Dietary recommendations Goals   None     Immunization History  Administered Date(s) Administered  . Influenza,inj,Quad PF,6+ Mos 02/27/2017, 06/20/2018  . Influenza-Unspecified 02/16/2016  . Pneumococcal Polysaccharide-23 06/20/2018  . Tdap 03/28/2015    Health Maintenance  Topic Date Due  . HEMOGLOBIN A1C  12/19/2018  . OPHTHALMOLOGY EXAM  04/29/2019  . FOOT EXAM  06/21/2019  . URINE MICROALBUMIN  06/21/2019  . TETANUS/TDAP  03/27/2025  . COLONOSCOPY  11/20/2026  . INFLUENZA VACCINE  Completed  . PNEUMOCOCCAL POLYSACCHARIDE VACCINE AGE 27-64 HIGH RISK  Completed  . Hepatitis C Screening  Completed  . HIV Screening  Completed     Discussed health benefits of physical activity, and encouraged him to engage in regular exercise appropriate for his age and condition.   1. Type 2 diabetes mellitus without complication, without long-term current use of insulin (HCC)  A1c today is 10.2 and uncontrolled. Patient has been off medications for at least six months. He reports he had alternating diarrhea and constipation prior to initiating metformin  and so will restart metformin as below. See back in one month. Hard script provided for glucometer, would like him to check fasting sugars at least three times a week.  - POCT HgB A1C - Comprehensive Metabolic Panel (CMET) - CBC with Differential - Lipid Profile - TSH - Urine Microalbumin w/creat. ratio - metFORMIN (GLUCOPHAGE XR) 500 MG 24 hr tablet; 500 mg 1x daily x 7 days. 500 mg 2x daily for 7 days. 1000 mg in the morning and 500 mg at night x 7 days. 1000 mg twice daily onward.  Dispense: 90 tablet; Refill: 0  2. Encounter for screening for HIV  - HIV antibody (with reflex)  3. Encounter for hepatitis C screening test for low risk patient  - Hepatitis C antibody  4. Hyperlipidemia associated with type 2 diabetes mellitus ([place])  Will plant to start lipitor at next visit as HLD is above goal on most recent lipid panel.   Return in about 1 month (around 07/19/2018) for dM, HLD.  The entirety of the information documented in the History of Present Illness, Review of Systems and Physical Exam were personally obtained by me. Portions of this information were initially documented by Allyson Sabal, CMA and reviewed by me for thoroughness and accuracy.    --------------------------------------------------------------------    Trinna Post, PA-C  [facility]

## 2018-06-21 LAB — CBC WITH DIFFERENTIAL/PLATELET
Basophils Absolute: 0 10*3/uL (ref 0.0–0.2)
Basos: 0 %
EOS (ABSOLUTE): 0.1 10*3/uL (ref 0.0–0.4)
Eos: 1 %
Hematocrit: 42.6 % (ref 37.5–51.0)
Hemoglobin: 14.6 g/dL (ref 13.0–17.7)
Immature Grans (Abs): 0 10*3/uL (ref 0.0–0.1)
Immature Granulocytes: 1 %
Lymphocytes Absolute: 1.2 10*3/uL (ref 0.7–3.1)
Lymphs: 18 %
MCH: 31.3 pg (ref 26.6–33.0)
MCHC: 34.3 g/dL (ref 31.5–35.7)
MCV: 91 fL (ref 79–97)
Monocytes Absolute: 0.6 10*3/uL (ref 0.1–0.9)
Monocytes: 8 %
Neutrophils Absolute: 5 10*3/uL (ref 1.4–7.0)
Neutrophils: 72 %
Platelets: 211 10*3/uL (ref 150–450)
RBC: 4.67 x10E6/uL (ref 4.14–5.80)
RDW: 13.3 % (ref 11.6–15.4)
WBC: 6.9 10*3/uL (ref 3.4–10.8)

## 2018-06-21 LAB — LIPID PANEL
Chol/HDL Ratio: 5.6 ratio — ABNORMAL HIGH (ref 0.0–5.0)
Cholesterol, Total: 245 mg/dL — ABNORMAL HIGH (ref 100–199)
HDL: 44 mg/dL (ref >39)
LDL Calculated: 141 mg/dL — ABNORMAL HIGH (ref 0–99)
Triglycerides: 302 mg/dL — ABNORMAL HIGH (ref 0–149)
VLDL Cholesterol Cal: 60 mg/dL — ABNORMAL HIGH (ref 5–40)

## 2018-06-21 LAB — COMPREHENSIVE METABOLIC PANEL
ALT: 15 IU/L (ref 0–44)
AST: 18 IU/L (ref 0–40)
Albumin/Globulin Ratio: 1.6 (ref 1.2–2.2)
Albumin: 4.7 g/dL (ref 3.8–4.9)
Alkaline Phosphatase: 103 IU/L (ref 39–117)
BUN/Creatinine Ratio: 13 (ref 9–20)
BUN: 13 mg/dL (ref 6–24)
Bilirubin Total: 1.3 mg/dL — ABNORMAL HIGH (ref 0.0–1.2)
CO2: 23 mmol/L (ref 20–29)
Calcium: 9.7 mg/dL (ref 8.7–10.2)
Chloride: 99 mmol/L (ref 96–106)
Creatinine, Ser: 1.03 mg/dL (ref 0.76–1.27)
GFR calc Af Amer: 94 mL/min/{1.73_m2} (ref >59)
GFR calc non Af Amer: 81 mL/min/{1.73_m2} (ref >59)
Globulin, Total: 3 g/dL (ref 1.5–4.5)
Glucose: 268 mg/dL — ABNORMAL HIGH (ref 65–99)
Potassium: 4.2 mmol/L (ref 3.5–5.2)
Sodium: 137 mmol/L (ref 134–144)
Total Protein: 7.7 g/dL (ref 6.0–8.5)

## 2018-06-21 LAB — MICROALBUMIN / CREATININE URINE RATIO
Creatinine, Urine: 114.1 mg/dL
Microalb/Creat Ratio: 19 mg/g creat (ref 0–29)
Microalbumin, Urine: 21.5 ug/mL

## 2018-06-21 LAB — HEPATITIS C ANTIBODY: Hep C Virus Ab: <0.1 s/co ratio (ref 0.0–0.9)

## 2018-06-21 LAB — HIV ANTIBODY (ROUTINE TESTING W REFLEX): HIV Screen 4th Generation wRfx: NONREACTIVE

## 2018-06-21 LAB — TSH: TSH: 2.2 u[IU]/mL (ref 0.450–4.500)

## 2018-06-22 ENCOUNTER — Telehealth: Payer: Self-pay

## 2018-06-22 NOTE — Telephone Encounter (Signed)
NA. Voicemail not set up.  

## 2018-06-22 NOTE — Telephone Encounter (Signed)
-----   Message from Trey Sailors, [place] sent at 06/22/2018  9:45 AM EST ----- Sugar high as expected from A1c in the office. CBC normal. Cholesterol uncontrolled and will require medication as we discussed in clinic. TSH normal. Urine micro normal. Hep C and HIV negative. See him in one month after starting metformin.

## 2018-06-22 NOTE — Patient Instructions (Signed)
Diabetes Mellitus and Exercise Exercising regularly is important for your overall health, especially when you have diabetes (diabetes mellitus). Exercising is not only about losing weight. It has many other health benefits, such as increasing muscle strength and bone density and reducing body fat and stress. This leads to improved fitness, flexibility, and endurance, all of which result in better overall health. Exercise has additional benefits for people with diabetes, including:  Reducing appetite.  Helping to lower and control blood glucose.  Lowering blood pressure.  Helping to control amounts of fatty substances (lipids) in the blood, such as cholesterol and triglycerides.  Helping the body to respond better to insulin (improving insulin sensitivity).  Reducing how much insulin the body needs.  Decreasing the risk for heart disease by: ? Lowering cholesterol and triglyceride levels. ? Increasing the levels of good cholesterol. ? Lowering blood glucose levels. What is my activity plan? Your health care provider or certified diabetes educator can help you make a plan for the type and frequency of exercise (activity plan) that works for you. Make sure that you:  Do at least 150 minutes of moderate-intensity or vigorous-intensity exercise each week. This could be brisk walking, biking, or water aerobics. ? Do stretching and strength exercises, such as yoga or weightlifting, at least 2 times a week. ? Spread out your activity over at least 3 days of the week.  Get some form of physical activity every day. ? Do not go more than 2 days in a row without some kind of physical activity. ? Avoid being inactive for more than 30 minutes at a time. Take frequent breaks to walk or stretch.  Choose a type of exercise or activity that you enjoy, and set realistic goals.  Start slowly, and gradually increase the intensity of your exercise over time. What do I need to know about managing my  diabetes?   Check your blood glucose before and after exercising. ? If your blood glucose is 240 mg/dL (13.3 mmol/L) or higher before you exercise, check your urine for ketones. If you have ketones in your urine, do not exercise until your blood glucose returns to normal. ? If your blood glucose is 100 mg/dL (5.6 mmol/L) or lower, eat a snack containing 15-20 grams of carbohydrate. Check your blood glucose 15 minutes after the snack to make sure that your level is above 100 mg/dL (5.6 mmol/L) before you start your exercise.  Know the symptoms of low blood glucose (hypoglycemia) and how to treat it. Your risk for hypoglycemia increases during and after exercise. Common symptoms of hypoglycemia can include: ? Hunger. ? Anxiety. ? Sweating and feeling clammy. ? Confusion. ? Dizziness or feeling light-headed. ? Increased heart rate or palpitations. ? Blurry vision. ? Tingling or numbness around the mouth, lips, or tongue. ? Tremors or shakes. ? Irritability.  Keep a rapid-acting carbohydrate snack available before, during, and after exercise to help prevent or treat hypoglycemia.  Avoid injecting insulin into areas of the body that are going to be exercised. For example, avoid injecting insulin into: ? The arms, when playing tennis. ? The legs, when jogging.  Keep records of your exercise habits. Doing this can help you and your health care provider adjust your diabetes management plan as needed. Write down: ? Food that you eat before and after you exercise. ? Blood glucose levels before and after you exercise. ? The type and amount of exercise you have done. ? When your insulin is expected to peak, if you use   insulin. Avoid exercising at times when your insulin is peaking.  When you start a new exercise or activity, work with your health care provider to make sure the activity is safe for you, and to adjust your insulin, medicines, or food intake as needed.  Drink plenty of water while  you exercise to prevent dehydration or heat stroke. Drink enough fluid to keep your urine clear or pale yellow. Summary  Exercising regularly is important for your overall health, especially when you have diabetes (diabetes mellitus).  Exercising has many health benefits, such as increasing muscle strength and bone density and reducing body fat and stress.  Your health care provider or certified diabetes educator can help you make a plan for the type and frequency of exercise (activity plan) that works for you.  When you start a new exercise or activity, work with your health care provider to make sure the activity is safe for you, and to adjust your insulin, medicines, or food intake as needed. This information is not intended to replace advice given to you by your health care provider. Make sure you discuss any questions you have with your health care provider. Document Released: 07/25/2003 Document Revised: 11/12/2016 Document Reviewed: 10/14/2015 Elsevier Interactive Patient Education  2019 [employer]  

## 2018-06-23 NOTE — Telephone Encounter (Signed)
Patient was advised.  

## 2018-07-19 ENCOUNTER — Ambulatory Visit: Payer: BLUE CROSS/BLUE SHIELD | Admitting: Physician Assistant

## 2018-07-19 ENCOUNTER — Encounter: Payer: Self-pay | Admitting: Physician Assistant

## 2018-07-19 VITALS — BP 129/78 | HR 84 | Temp 98.0°F | Wt 174.4 lb

## 2018-07-19 DIAGNOSIS — E785 Hyperlipidemia, unspecified: Secondary | ICD-10-CM | POA: Diagnosis not present

## 2018-07-19 DIAGNOSIS — E1169 Type 2 diabetes mellitus with other specified complication: Secondary | ICD-10-CM | POA: Diagnosis not present

## 2018-07-19 DIAGNOSIS — E119 Type 2 diabetes mellitus without complications: Secondary | ICD-10-CM | POA: Diagnosis not present

## 2018-07-19 MED ORDER — ATORVASTATIN CALCIUM 20 MG PO TABS: 20.0000 mg | ORAL_TABLET | Freq: Every day | ORAL | 3 refills | Status: DC

## 2018-07-19 MED ORDER — DAPAGLIFLOZIN PROPANEDIOL 5 MG PO TABS: 5.0000 mg | ORAL_TABLET | Freq: Every day | ORAL | 0 refills | Status: DC

## 2018-07-19 NOTE — Progress Notes (Signed)
Patient: Jeffrey Velazquez Male    DOB: 1960/11/30   58 y.o.   MRN: [id number] Visit Date: 07/20/2018  Today's Provider: Trinna Post, PA-C   Chief Complaint  Patient presents with  . Diabetes   Subjective:     HPI  Diabetes Mellitus Type II, Follow-up:   Lab Results  Component Value Date   HGBA1C 10.8 (A) 06/20/2018   HGBA1C 5.9 (H) 06/09/2017   HGBA1C 6.5 (H) 02/20/2017   Last seen for diabetes 1 months ago.  Management since then includes restarting Metformin and checking fasting sugars. He reports good compliance with treatment. He is having side effects. Patient reports that he has been experiencing diarrhea since starting medication back. Not tolerating medication well.  Current symptoms include Fatigue and diarrhea and have been unchanged. Home blood sugar records: fasting range: 129-220  Home blood sugar 2 hours after eating range: 238-296  Episodes of hypoglycemia? no   Current Insulin Regimen: none Most Recent Eye Exam: February  Weight trend: increasing steadily Prior visit with dietician: no Current diet: patient reports that he doesn't eat a lot and when he does it's late at night before he goes to bed Current exercise: none  ------------------------------------------------------------------------   Hypertension, follow-up:  BP Readings from Last 3 Encounters:  07/19/18 129/78  06/20/18 134/85  06/07/17 125/80    He was last seen for hypertension 1 months ago.  BP at that visit was 134/85. Management since that visit includes none .He reports good compliance with treatment. He is not having side effects.  He is not exercising. He is not adherent to low salt diet.   Outside blood pressures are not checked outside of office. He is experiencing fatigue.  Patient denies chest pain, irregular heart beat, near-syncope and palpitations.   Cardiovascular risk factors include diabetes mellitus.  Use of agents associated with hypertension:  none.   ------------------------------------------------------------------------    Lipid/Cholesterol, Follow-up:   Last seen for this 1 months ago.  Management since that visit includes nothing at this time but when Patient was seen on 06/20/2018 it was discussed that pt would start Lipitor at next visit on 07/19/2018.  Last Lipid Panel:    Component Value Date/Time   CHOL 245 (H) 06/20/2018 1507   TRIG 302 (H) 06/20/2018 1507   HDL 44 06/20/2018 1507   CHOLHDL 5.6 (H) 06/20/2018 1507   CHOLHDL 3.5 02/20/2017 0729   VLDL 43 (H) 02/20/2017 0729   LDLCALC 141 (H) 06/20/2018 1507    He reports good compliance with treatment. He is not having side effects.   Wt Readings from Last 3 Encounters:  07/19/18 174 lb 6.4 oz (79.1 kg)  06/20/18 171 lb 12.8 oz (77.9 kg)  06/05/17 140 lb (63.5 kg)    ------------------------------------------------------------------------  Allergies  Allergen Reactions  . Codeine Nausea Only and Other (See Comments)    dizziness  . Trazodone And Nefazodone     Pt states "I can't walk".      Current Outpatient Medications:  .  Blood Glucose Monitoring Suppl (ONE TOUCH ULTRA 2) w/Device KIT, USE UTD, Disp: , Rfl:  .  Lancets 30G MISC, by Does not apply route., Disp: , Rfl:  .  metFORMIN (GLUCOPHAGE XR) 500 MG 24 hr tablet, 500 mg 1x daily x 7 days. 500 mg 2x daily for 7 days. 1000 mg in the morning and 500 mg at night x 7 days. 1000 mg twice daily onward., Disp: 90 tablet, Rfl: 0 .  atorvastatin (LIPITOR) 20 MG tablet, Take 1 tablet (20 mg total) by mouth daily., Disp: 90 tablet, Rfl: 3 .  dapagliflozin propanediol (FARXIGA) 5 MG TABS tablet, Take 5 mg by mouth daily., Disp: 450 mg, Rfl: 0 .  Melatonin 3 MG TABS, Take 9 mg by mouth., Disp: , Rfl:  .  Multiple Vitamin (MULTI-VITAMINS) TABS, Take by mouth., Disp: , Rfl:   Review of Systems  Constitutional: Positive for fatigue.  Gastrointestinal: Positive for diarrhea.    Social History    Tobacco Use  . Smoking status: Never Smoker  . Smokeless tobacco: Never Used  Substance Use Topics  . Alcohol use: No      Objective:   BP 129/78 (BP Location: Left Arm, Patient Position: Sitting, Cuff Size: Normal)   Pulse 84   Temp 98 F (36.7 C) (Oral)   Wt 174 lb 6.4 oz (79.1 kg)   SpO2 99%   BMI 26.52 kg/m  Vitals:   07/19/18 1502  BP: 129/78  Pulse: 84  Temp: 98 F (36.7 C)  TempSrc: Oral  SpO2: 99%  Weight: 174 lb 6.4 oz (79.1 kg)     Physical Exam Constitutional:      Appearance: Normal appearance. He is normal weight.  Cardiovascular:     Rate and Rhythm: Normal rate and regular rhythm.     Heart sounds: Normal heart sounds.  Pulmonary:     Effort: Pulmonary effort is normal.     Breath sounds: Normal breath sounds.  Skin:    General: Skin is warm and dry.  Neurological:     Mental Status: He is oriented to person, place, and time. Mental status is at baseline.  Psychiatric:        Mood and Affect: Mood normal.        Behavior: Behavior normal.         Assessment & Plan    1. Type 2 diabetes mellitus without complication, without long-term current use of insulin ([place])  He is not tolerating metformin due to diarrhea, though his sugars have decreased from 180's to 120's over the past month of using metformin. Since the diarrhea is intolerable to him, will change to farxiga as below. Counseled on side effects including UTI, genital mycotic infection, and urinary frequency.   - dapagliflozin propanediol (FARXIGA) 5 MG TABS tablet; Take 5 mg by mouth daily.  Dispense: 450 mg; Refill: 0  2. Hyperlipidemia associated with type 2 diabetes mellitus (HCC)  Cholesterol is uncontrolled in the context of diabetes, restart lipitor 20 mg daily as below.  - atorvastatin (LIPITOR) 20 MG tablet; Take 1 tablet (20 mg total) by mouth daily.  Dispense: 90 tablet; Refill: 3  The entirety of the information documented in the History of Present Illness, Review of  Systems and Physical Exam were personally obtained by me. Portions of this information were initially documented by [facility], CMA and reviewed by me for thoroughness and accuracy.   Return in about 2 months (around 09/18/2018) for DM, HLD.       Trinna Post, PA-C  [facility]

## 2018-07-20 NOTE — Patient Instructions (Signed)
Diabetes Mellitus and Exercise Exercising regularly is important for your overall health, especially when you have diabetes (diabetes mellitus). Exercising is not only about losing weight. It has many other health benefits, such as increasing muscle strength and bone density and reducing body fat and stress. This leads to improved fitness, flexibility, and endurance, all of which result in better overall health. Exercise has additional benefits for people with diabetes, including:  Reducing appetite.  Helping to lower and control blood glucose.  Lowering blood pressure.  Helping to control amounts of fatty substances (lipids) in the blood, such as cholesterol and triglycerides.  Helping the body to respond better to insulin (improving insulin sensitivity).  Reducing how much insulin the body needs.  Decreasing the risk for heart disease by: ? Lowering cholesterol and triglyceride levels. ? Increasing the levels of good cholesterol. ? Lowering blood glucose levels. What is my activity plan? Your health care provider or certified diabetes educator can help you make a plan for the type and frequency of exercise (activity plan) that works for you. Make sure that you:  Do at least 150 minutes of moderate-intensity or vigorous-intensity exercise each week. This could be brisk walking, biking, or water aerobics. ? Do stretching and strength exercises, such as yoga or weightlifting, at least 2 times a week. ? Spread out your activity over at least 3 days of the week.  Get some form of physical activity every day. ? Do not go more than 2 days in a row without some kind of physical activity. ? Avoid being inactive for more than 30 minutes at a time. Take frequent breaks to walk or stretch.  Choose a type of exercise or activity that you enjoy, and set realistic goals.  Start slowly, and gradually increase the intensity of your exercise over time. What do I need to know about managing my  diabetes?   Check your blood glucose before and after exercising. ? If your blood glucose is 240 mg/dL (13.3 mmol/L) or higher before you exercise, check your urine for ketones. If you have ketones in your urine, do not exercise until your blood glucose returns to normal. ? If your blood glucose is 100 mg/dL (5.6 mmol/L) or lower, eat a snack containing 15-20 grams of carbohydrate. Check your blood glucose 15 minutes after the snack to make sure that your level is above 100 mg/dL (5.6 mmol/L) before you start your exercise.  Know the symptoms of low blood glucose (hypoglycemia) and how to treat it. Your risk for hypoglycemia increases during and after exercise. Common symptoms of hypoglycemia can include: ? Hunger. ? Anxiety. ? Sweating and feeling clammy. ? Confusion. ? Dizziness or feeling light-headed. ? Increased heart rate or palpitations. ? Blurry vision. ? Tingling or numbness around the mouth, lips, or tongue. ? Tremors or shakes. ? Irritability.  Keep a rapid-acting carbohydrate snack available before, during, and after exercise to help prevent or treat hypoglycemia.  Avoid injecting insulin into areas of the body that are going to be exercised. For example, avoid injecting insulin into: ? The arms, when playing tennis. ? The legs, when jogging.  Keep records of your exercise habits. Doing this can help you and your health care provider adjust your diabetes management plan as needed. Write down: ? Food that you eat before and after you exercise. ? Blood glucose levels before and after you exercise. ? The type and amount of exercise you have done. ? When your insulin is expected to peak, if you use   insulin. Avoid exercising at times when your insulin is peaking.  When you start a new exercise or activity, work with your health care provider to make sure the activity is safe for you, and to adjust your insulin, medicines, or food intake as needed.  Drink plenty of water while  you exercise to prevent dehydration or heat stroke. Drink enough fluid to keep your urine clear or pale yellow. Summary  Exercising regularly is important for your overall health, especially when you have diabetes (diabetes mellitus).  Exercising has many health benefits, such as increasing muscle strength and bone density and reducing body fat and stress.  Your health care provider or certified diabetes educator can help you make a plan for the type and frequency of exercise (activity plan) that works for you.  When you start a new exercise or activity, work with your health care provider to make sure the activity is safe for you, and to adjust your insulin, medicines, or food intake as needed. This information is not intended to replace advice given to you by your health care provider. Make sure you discuss any questions you have with your health care provider. Document Released: 07/25/2003 Document Revised: 11/12/2016 Document Reviewed: 10/14/2015 Elsevier Interactive Patient Education  2019 [employer]  

## 2018-08-01 ENCOUNTER — Other Ambulatory Visit: Payer: Self-pay

## 2018-08-01 ENCOUNTER — Ambulatory Visit: Payer: BLUE CROSS/BLUE SHIELD | Admitting: Physician Assistant

## 2018-08-01 ENCOUNTER — Encounter: Payer: Self-pay | Admitting: Physician Assistant

## 2018-08-01 VITALS — BP 145/82 | HR 98 | Temp 98.8°F | Resp 16 | Wt 168.0 lb

## 2018-08-01 DIAGNOSIS — J0100 Acute maxillary sinusitis, unspecified: Secondary | ICD-10-CM

## 2018-08-01 DIAGNOSIS — J40 Bronchitis, not specified as acute or chronic: Secondary | ICD-10-CM | POA: Diagnosis not present

## 2018-08-01 MED ORDER — PREDNISONE 20 MG PO TABS: 20.0000 mg | ORAL_TABLET | Freq: Every day | ORAL | 0 refills | Status: DC

## 2018-08-01 MED ORDER — BENZONATATE 200 MG PO CAPS: 200.0000 mg | ORAL_CAPSULE | Freq: Three times a day (TID) | ORAL | 0 refills | Status: DC | PRN

## 2018-08-01 MED ORDER — AMOXICILLIN-POT CLAVULANATE 875-125 MG PO TABS: 1.0000 | ORAL_TABLET | Freq: Two times a day (BID) | ORAL | 0 refills | Status: DC

## 2018-08-01 NOTE — Patient Instructions (Signed)
Acute Bronchitis, Adult Acute bronchitis is when air tubes (bronchi) in the lungs suddenly get swollen. The condition can make it hard to breathe. It can also cause these symptoms:  A cough.  Coughing up clear, yellow, or green mucus.  Wheezing.  Chest congestion.  Shortness of breath.  A fever.  Body aches.  Chills.  A sore throat. Follow these instructions at home:  Medicines  Take over-the-counter and prescription medicines only as told by your doctor.  If you were prescribed an antibiotic medicine, take it as told by your doctor. Do not stop taking the antibiotic even if you start to feel better. General instructions  Rest.  Drink enough fluids to keep your pee (urine) pale yellow.  Avoid smoking and secondhand smoke. If you smoke and you need help quitting, ask your doctor. Quitting will help your lungs heal faster.  Use an inhaler, cool mist vaporizer, or humidifier as told by your doctor.  Keep all follow-up visits as told by your doctor. This is important. How is this prevented? To lower your risk of getting this condition again:  Wash your hands often with soap and water. If you cannot use soap and water, use hand sanitizer.  Avoid contact with people who have cold symptoms.  Try not to touch your hands to your mouth, nose, or eyes.  Make sure to get the flu shot every year. Contact a doctor if:  Your symptoms do not get better in 2 weeks. Get help right away if:  You cough up blood.  You have chest pain.  You have very bad shortness of breath.  You become dehydrated.  You faint (pass out) or keep feeling like you are going to pass out.  You keep throwing up (vomiting).  You have a very bad headache.  Your fever or chills gets worse. This information is not intended to replace advice given to you by your health care provider. Make sure you discuss any questions you have with your health care provider. Document Released: 10/21/2007 Document  Revised: 12/16/2016 Document Reviewed: 10/23/2015 Elsevier Interactive Patient Education  2019 Elsevier Inc. Sinusitis, Adult Sinusitis is inflammation of your sinuses. Sinuses are hollow spaces in the bones around your face. Your sinuses are located:  Around your eyes.  In the middle of your forehead.  Behind your nose.  In your cheekbones. Mucus normally drains out of your sinuses. When your nasal tissues become inflamed or swollen, mucus can become trapped or blocked. This allows bacteria, viruses, and fungi to grow, which leads to infection. Most infections of the sinuses are caused by a virus. Sinusitis can develop quickly. It can last for up to 4 weeks (acute) or for more than 12 weeks (chronic). Sinusitis often develops after a cold. What are the causes? This condition is caused by anything that creates swelling in the sinuses or stops mucus from draining. This includes:  Allergies.  Asthma.  Infection from bacteria or viruses.  Deformities or blockages in your nose or sinuses.  Abnormal growths in the nose (nasal polyps).  Pollutants, such as chemicals or irritants in the air.  Infection from fungi (rare). What increases the risk? You are more likely to develop this condition if you:  Have a weak body defense system (immune system).  Do a lot of swimming or diving.  Overuse nasal sprays.  Smoke. What are the signs or symptoms? The main symptoms of this condition are pain and a feeling of pressure around the affected sinuses. Other symptoms include:    Stuffy nose or congestion.  Thick drainage from your nose.  Swelling and warmth over the affected sinuses.  Headache.  Upper toothache.  A cough that may get worse at night.  Extra mucus that collects in the throat or the back of the nose (postnasal drip).  Decreased sense of smell and taste.  Fatigue.  A fever.  Sore throat.  Bad breath. How is this diagnosed? This condition is diagnosed based  on:  Your symptoms.  Your medical history.  A physical exam.  Tests to find out if your condition is acute or chronic. This may include: ? Checking your nose for nasal polyps. ? Viewing your sinuses using a device that has a light (endoscope). ? Testing for allergies or bacteria. ? Imaging tests, such as an MRI or CT scan. In rare cases, a bone biopsy may be done to rule out more serious types of fungal sinus disease. How is this treated? Treatment for sinusitis depends on the cause and whether your condition is chronic or acute.  If caused by a virus, your symptoms should go away on their own within 10 days. You may be given medicines to relieve symptoms. They include: ? Medicines that shrink swollen nasal passages (topical intranasal decongestants). ? Medicines that treat allergies (antihistamines). ? A spray that eases inflammation of the nostrils (topical intranasal corticosteroids). ? Rinses that help get rid of thick mucus in your nose (nasal saline washes).  If caused by bacteria, your health care provider may recommend waiting to see if your symptoms improve. Most bacterial infections will get better without antibiotic medicine. You may be given antibiotics if you have: ? A severe infection. ? A weak immune system.  If caused by narrow nasal passages or nasal polyps, you may need to have surgery. Follow these instructions at home: Medicines  Take, use, or apply over-the-counter and prescription medicines only as told by your health care provider. These may include nasal sprays.  If you were prescribed an antibiotic medicine, take it as told by your health care provider. Do not stop taking the antibiotic even if you start to feel better. Hydrate and humidify   Drink enough fluid to keep your urine pale yellow. Staying hydrated will help to thin your mucus.  Use a cool mist humidifier to keep the humidity level in your home above 50%.  Inhale steam for 10-15 minutes,  3-4 times a day, or as told by your health care provider. You can do this in the bathroom while a hot shower is running.  Limit your exposure to cool or dry air. Rest  Rest as much as possible.  Sleep with your head raised (elevated).  Make sure you get enough sleep each night. General instructions   Apply a warm, moist washcloth to your face 3-4 times a day or as told by your health care provider. This will help with discomfort.  Wash your hands often with soap and water to reduce your exposure to germs. If soap and water are not available, use hand sanitizer.  Do not smoke. Avoid being around people who are smoking (secondhand smoke).  Keep all follow-up visits as told by your health care provider. This is important. Contact a health care provider if:  You have a fever.  Your symptoms get worse.  Your symptoms do not improve within 10 days. Get help right away if:  You have a severe headache.  You have persistent vomiting.  You have severe pain or swelling around your face or   eyes.  You have vision problems.  You develop confusion.  Your neck is stiff.  You have trouble breathing. Summary  Sinusitis is soreness and inflammation of your sinuses. Sinuses are hollow spaces in the bones around your face.  This condition is caused by nasal tissues that become inflamed or swollen. The swelling traps or blocks the flow of mucus. This allows bacteria, viruses, and fungi to grow, which leads to infection.  If you were prescribed an antibiotic medicine, take it as told by your health care provider. Do not stop taking the antibiotic even if you start to feel better.  Keep all follow-up visits as told by your health care provider. This is important. This information is not intended to replace advice given to you by your health care provider. Make sure you discuss any questions you have with your health care provider. Document Released: 05/04/2005 Document Revised: 10/04/2017  Document Reviewed: 10/04/2017 Elsevier Interactive Patient Education  2019 Elsevier Inc.  

## 2018-08-01 NOTE — Progress Notes (Signed)
Patient: Jeffrey Velazquez Male    DOB: Apr 22, 1961   58 y.o.   MRN: [id number] Visit Date: 08/01/2018  Today's Provider: Mar Daring, PA-C   Chief Complaint  Patient presents with  . URI   Subjective:    I, Sulibeya S. Dimas, CMA, am acting as a Education administrator for [employer], PA-C.   HPI  Upper Respiratory Infection: Patient complains of symptoms of a URI, possible sinusitis. Symptoms include congestion and cough. Onset of symptoms was 1 weeks ago, unchanged since that time. He also c/o congestion and headache described as pressure for the past 1 weeks .  He is drinking plenty of fluids. Evaluation to date: none. Treatment to date: cough suppressants and decongestants.DayQuil, NyQuil    Allergies  Allergen Reactions  . Codeine Nausea Only and Other (See Comments)    dizziness  . Trazodone And Nefazodone     Pt states "I can't walk".      Current Outpatient Medications:  .  atorvastatin (LIPITOR) 20 MG tablet, Take 1 tablet (20 mg total) by mouth daily., Disp: 90 tablet, Rfl: 3 .  Blood Glucose Monitoring Suppl (ONE TOUCH ULTRA 2) w/Device KIT, USE UTD, Disp: , Rfl:  .  dapagliflozin propanediol (FARXIGA) 5 MG TABS tablet, Take 5 mg by mouth daily., Disp: 450 mg, Rfl: 0 .  Lancets 30G MISC, by Does not apply route., Disp: , Rfl:  .  Melatonin 3 MG TABS, Take 9 mg by mouth., Disp: , Rfl:  .  metFORMIN (GLUCOPHAGE XR) 500 MG 24 hr tablet, 500 mg 1x daily x 7 days. 500 mg 2x daily for 7 days. 1000 mg in the morning and 500 mg at night x 7 days. 1000 mg twice daily onward., Disp: 90 tablet, Rfl: 0 .  Multiple Vitamin (MULTI-VITAMINS) TABS, Take by mouth., Disp: , Rfl:   Review of Systems  Constitutional: Positive for fatigue.  HENT: Positive for congestion, postnasal drip, rhinorrhea, sinus pressure, sinus pain and sore throat. Negative for ear pain.   Respiratory: Positive for cough. Negative for chest tightness and shortness of breath.   Cardiovascular:  Negative.   Gastrointestinal: Negative.   Neurological: Positive for headaches. Negative for dizziness.    Social History   Tobacco Use  . Smoking status: Never Smoker  . Smokeless tobacco: Never Used  Substance Use Topics  . Alcohol use: No      Objective:   BP (!) 145/82 (BP Location: Left Arm, Patient Position: Sitting, Cuff Size: Normal)   Pulse 98   Temp 98.8 F (37.1 C) (Oral)   Resp 16   Wt 168 lb (76.2 kg)   SpO2 99%   BMI 25.54 kg/m  Vitals:   08/01/18 1756  BP: (!) 145/82  Pulse: 98  Resp: 16  Temp: 98.8 F (37.1 C)  TempSrc: Oral  SpO2: 99%  Weight: 168 lb (76.2 kg)     Physical Exam Vitals signs reviewed.  Constitutional:      General: He is not in acute distress.    Appearance: Normal appearance. He is well-developed. He is ill-appearing. He is not diaphoretic.  HENT:     Head: Normocephalic and atraumatic.     Right Ear: Hearing, tympanic membrane, ear canal and external ear normal. No middle ear effusion. Tympanic membrane is not erythematous or bulging.     Left Ear: Hearing, tympanic membrane, ear canal and external ear normal.  No middle ear effusion. Tympanic membrane is not erythematous or bulging.  Nose: Mucosal edema and rhinorrhea present.     Right Sinus: Maxillary sinus tenderness present. No frontal sinus tenderness.     Left Sinus: Maxillary sinus tenderness present. No frontal sinus tenderness.     Mouth/Throat:     Pharynx: Uvula midline. Posterior oropharyngeal erythema (very thick drainage) present. No oropharyngeal exudate.  Eyes:     General:        Right eye: No discharge.        Left eye: No discharge.     Conjunctiva/sclera: Conjunctivae normal.     Pupils: Pupils are equal, round, and reactive to light.  Neck:     Musculoskeletal: Normal range of motion and neck supple.     Thyroid: No thyromegaly.     Trachea: No tracheal deviation.     Meningeal: Brudzinski's sign and Kernig's sign absent.  Cardiovascular:      Rate and Rhythm: Normal rate and regular rhythm.     Heart sounds: Normal heart sounds. No murmur. No friction rub. No gallop.   Pulmonary:     Effort: Pulmonary effort is normal. No respiratory distress.     Breath sounds: No stridor. Wheezing (very fine throughout) present. No rales.  Lymphadenopathy:     Cervical: No cervical adenopathy.  Skin:    General: Skin is warm and dry.  Neurological:     Mental Status: He is alert.         Assessment & Plan    1. Acute non-recurrent maxillary sinusitis Worsening symptoms that have not responded to OTC medications. Will give augmentin as below. Continue allergy medications. Stay well hydrated and get plenty of rest. Call if no symptom improvement or if symptoms worsen. - amoxicillin-clavulanate (AUGMENTIN) 875-125 MG tablet; Take 1 tablet by mouth 2 (two) times daily.  Dispense: 20 tablet; Refill: 0  2. Bronchitis Worsening. Will treat with prednisone and tessalon perles. Push fluids. Rest. Call if worsening.  - amoxicillin-clavulanate (AUGMENTIN) 875-125 MG tablet; Take 1 tablet by mouth 2 (two) times daily.  Dispense: 20 tablet; Refill: 0 - predniSONE (DELTASONE) 20 MG tablet; Take 1 tablet (20 mg total) by mouth daily with breakfast.  Dispense: 5 tablet; Refill: 0 - benzonatate (TESSALON) 200 MG capsule; Take 1 capsule (200 mg total) by mouth 3 (three) times daily as needed.  Dispense: 30 capsule; Refill: 0     Mar Daring, PA-C  [facility]

## 2018-08-11 ENCOUNTER — Telehealth: Payer: Self-pay

## 2018-08-11 DIAGNOSIS — J40 Bronchitis, not specified as acute or chronic: Secondary | ICD-10-CM

## 2018-08-11 DIAGNOSIS — J0100 Acute maxillary sinusitis, unspecified: Secondary | ICD-10-CM

## 2018-08-11 NOTE — Telephone Encounter (Signed)
Pt called and advised he was seen on 08/01/18 and was given several rx's and pt has completed all meds and he is advised he felt some better but now is starting to cough more and congestion, nasal mucus is some clear and yellow/greenish color, no fever this time, at times a small headache.  Pt is requesting if he can get more meds to see if it will help to go away or does he need to be seen.  [facility]  Contact #: [phone].  dbs

## 2018-08-12 MED ORDER — AMOXICILLIN-POT CLAVULANATE 875-125 MG PO TABS: 1.0000 | ORAL_TABLET | Freq: Two times a day (BID) | ORAL | 0 refills | Status: DC

## 2018-08-12 NOTE — Telephone Encounter (Signed)
Augmentin sent to [facility]

## 2018-08-12 NOTE — Telephone Encounter (Signed)
Patient advised as below.  

## 2018-08-18 ENCOUNTER — Telehealth: Payer: Self-pay | Admitting: Physician Assistant

## 2018-08-18 DIAGNOSIS — J40 Bronchitis, not specified as acute or chronic: Secondary | ICD-10-CM

## 2018-08-18 DIAGNOSIS — J0100 Acute maxillary sinusitis, unspecified: Secondary | ICD-10-CM

## 2018-08-18 NOTE — Telephone Encounter (Signed)
Pt called back today to see if the antibiotic had been sent.  I told him [facility] [place], but in the message taken it said to send to [facility] pharmacy.  He ask if we could please resend to [facility]  CB#  [phone]  Thanks Barth Kirks

## 2018-08-23 MED ORDER — AMOXICILLIN-POT CLAVULANATE 875-125 MG PO TABS: 1.0000 | ORAL_TABLET | Freq: Two times a day (BID) | ORAL | 0 refills | Status: DC

## 2018-08-23 NOTE — Telephone Encounter (Signed)
Prescription canceled at [facility] and sent to [facility] the one on file. Call patient to inform but no answer unable to [facility] voicemail not set up.

## 2018-09-14 ENCOUNTER — Telehealth: Payer: Self-pay

## 2018-09-14 NOTE — Telephone Encounter (Signed)
Left message for patient to call regarding appointment to be changed to e-visit

## 2018-09-20 ENCOUNTER — Ambulatory Visit (INDEPENDENT_AMBULATORY_CARE_PROVIDER_SITE_OTHER): Payer: BLUE CROSS/BLUE SHIELD | Admitting: Physician Assistant

## 2018-09-20 ENCOUNTER — Encounter: Payer: Self-pay | Admitting: Physician Assistant

## 2018-09-20 ENCOUNTER — Other Ambulatory Visit: Payer: Self-pay

## 2018-09-20 ENCOUNTER — Ambulatory Visit: Payer: Self-pay | Admitting: Physician Assistant

## 2018-09-20 VITALS — BP 119/76 | HR 84 | Temp 98.0°F | Wt 169.2 lb

## 2018-09-20 DIAGNOSIS — E119 Type 2 diabetes mellitus without complications: Secondary | ICD-10-CM | POA: Diagnosis not present

## 2018-09-20 DIAGNOSIS — E785 Hyperlipidemia, unspecified: Secondary | ICD-10-CM

## 2018-09-20 DIAGNOSIS — E1169 Type 2 diabetes mellitus with other specified complication: Secondary | ICD-10-CM | POA: Diagnosis not present

## 2018-09-20 LAB — POCT GLYCOSYLATED HEMOGLOBIN (HGB A1C): Hemoglobin A1C: 9.7 % — AB (ref 4.0–5.6)

## 2018-09-20 MED ORDER — DAPAGLIFLOZIN PROPANEDIOL 10 MG PO TABS: 10.0000 mg | ORAL_TABLET | Freq: Every day | ORAL | 0 refills | Status: AC

## 2018-09-20 MED ORDER — DAPAGLIFLOZIN PROPANEDIOL 10 MG PO TABS: 10.0000 mg | ORAL_TABLET | Freq: Every day | ORAL | 0 refills | Status: DC

## 2018-09-20 NOTE — Patient Instructions (Signed)
Diabetes Mellitus and Exercise Exercising regularly is important for your overall health, especially when you have diabetes (diabetes mellitus). Exercising is not only about losing weight. It has many other health benefits, such as increasing muscle strength and bone density and reducing body fat and stress. This leads to improved fitness, flexibility, and endurance, all of which result in better overall health. Exercise has additional benefits for people with diabetes, including:  Reducing appetite.  Helping to lower and control blood glucose.  Lowering blood pressure.  Helping to control amounts of fatty substances (lipids) in the blood, such as cholesterol and triglycerides.  Helping the body to respond better to insulin (improving insulin sensitivity).  Reducing how much insulin the body needs.  Decreasing the risk for heart disease by: ? Lowering cholesterol and triglyceride levels. ? Increasing the levels of good cholesterol. ? Lowering blood glucose levels. What is my activity plan? Your health care provider or certified diabetes educator can help you make a plan for the type and frequency of exercise (activity plan) that works for you. Make sure that you:  Do at least 150 minutes of moderate-intensity or vigorous-intensity exercise each week. This could be brisk walking, biking, or water aerobics. ? Do stretching and strength exercises, such as yoga or weightlifting, at least 2 times a week. ? Spread out your activity over at least 3 days of the week.  Get some form of physical activity every day. ? Do not go more than 2 days in a row without some kind of physical activity. ? Avoid being inactive for more than 30 minutes at a time. Take frequent breaks to walk or stretch.  Choose a type of exercise or activity that you enjoy, and set realistic goals.  Start slowly, and gradually increase the intensity of your exercise over time. What do I need to know about managing my  diabetes?   Check your blood glucose before and after exercising. ? If your blood glucose is 240 mg/dL (13.3 mmol/L) or higher before you exercise, check your urine for ketones. If you have ketones in your urine, do not exercise until your blood glucose returns to normal. ? If your blood glucose is 100 mg/dL (5.6 mmol/L) or lower, eat a snack containing 15-20 grams of carbohydrate. Check your blood glucose 15 minutes after the snack to make sure that your level is above 100 mg/dL (5.6 mmol/L) before you start your exercise.  Know the symptoms of low blood glucose (hypoglycemia) and how to treat it. Your risk for hypoglycemia increases during and after exercise. Common symptoms of hypoglycemia can include: ? Hunger. ? Anxiety. ? Sweating and feeling clammy. ? Confusion. ? Dizziness or feeling light-headed. ? Increased heart rate or palpitations. ? Blurry vision. ? Tingling or numbness around the mouth, lips, or tongue. ? Tremors or shakes. ? Irritability.  Keep a rapid-acting carbohydrate snack available before, during, and after exercise to help prevent or treat hypoglycemia.  Avoid injecting insulin into areas of the body that are going to be exercised. For example, avoid injecting insulin into: ? The arms, when playing tennis. ? The legs, when jogging.  Keep records of your exercise habits. Doing this can help you and your health care provider adjust your diabetes management plan as needed. Write down: ? Food that you eat before and after you exercise. ? Blood glucose levels before and after you exercise. ? The type and amount of exercise you have done. ? When your insulin is expected to peak, if you use   insulin. Avoid exercising at times when your insulin is peaking.  When you start a new exercise or activity, work with your health care provider to make sure the activity is safe for you, and to adjust your insulin, medicines, or food intake as needed.  Drink plenty of water while  you exercise to prevent dehydration or heat stroke. Drink enough fluid to keep your urine clear or pale yellow. Summary  Exercising regularly is important for your overall health, especially when you have diabetes (diabetes mellitus).  Exercising has many health benefits, such as increasing muscle strength and bone density and reducing body fat and stress.  Your health care provider or certified diabetes educator can help you make a plan for the type and frequency of exercise (activity plan) that works for you.  When you start a new exercise or activity, work with your health care provider to make sure the activity is safe for you, and to adjust your insulin, medicines, or food intake as needed. This information is not intended to replace advice given to you by your health care provider. Make sure you discuss any questions you have with your health care provider. Document Released: 07/25/2003 Document Revised: 11/12/2016 Document Reviewed: 10/14/2015 Elsevier Interactive Patient Education  2019 [employer]  

## 2018-09-20 NOTE — Progress Notes (Signed)
Patient: Jeffrey Velazquez Male    DOB: 01-20-1961   58 y.o.   MRN: [id number] Visit Date: 09/20/2018  Today's Provider: Trinna Post, PA-C   Chief Complaint  Patient presents with  . Diabetes   Subjective:     HPI   Type 2 diabetes mellitus without complication, without long-term current use of insulin ([place]) From 07/19/2018-changed to farxiga. Counseled on side effects including UTI, genital mycotic infection, and urinary frequency. Patient reports good compliance with treatment plan. FBS is ranging between 120-130's. Metformin was stopped completely due to intolerable diarrhea.   Wt Readings from Last 3 Encounters:  09/20/18 169 lb 3.2 oz (76.7 kg)  08/01/18 168 lb (76.2 kg)  07/19/18 174 lb 6.4 oz (79.1 kg)    Allergies  Allergen Reactions  . Codeine Nausea Only and Other (See Comments)    dizziness  . Trazodone And Nefazodone     Pt states "I can't walk".      Current Outpatient Medications:  .  atorvastatin (LIPITOR) 20 MG tablet, Take 1 tablet (20 mg total) by mouth daily., Disp: 90 tablet, Rfl: 3 .  Blood Glucose Monitoring Suppl (ONE TOUCH ULTRA 2) w/Device KIT, USE UTD, Disp: , Rfl:  .  dapagliflozin propanediol (FARXIGA) 5 MG TABS tablet, Take 5 mg by mouth daily., Disp: 450 mg, Rfl: 0 .  Lancets 30G MISC, by Does not apply route., Disp: , Rfl:  .  Melatonin 3 MG TABS, Take 9 mg by mouth., Disp: , Rfl:  .  metFORMIN (GLUCOPHAGE XR) 500 MG 24 hr tablet, 500 mg 1x daily x 7 days. 500 mg 2x daily for 7 days. 1000 mg in the morning and 500 mg at night x 7 days. 1000 mg twice daily onward., Disp: 90 tablet, Rfl: 0 .  Multiple Vitamin (MULTI-VITAMINS) TABS, Take by mouth., Disp: , Rfl:   Review of Systems  Constitutional: Negative for appetite change, chills and fever.  Respiratory: Negative for chest tightness, shortness of breath and wheezing.   Cardiovascular: Negative for chest pain and palpitations.  Gastrointestinal: Negative for abdominal pain,  nausea and vomiting.    Social History   Tobacco Use  . Smoking status: Never Smoker  . Smokeless tobacco: Never Used  Substance Use Topics  . Alcohol use: No      Objective:   BP 119/76 (BP Location: Right Arm, Patient Position: Sitting, Cuff Size: Normal)   Pulse 84   Temp 98 F (36.7 C) (Oral)   Wt 169 lb 3.2 oz (76.7 kg)   SpO2 98%   BMI 25.73 kg/m  Vitals:   09/20/18 1443  BP: 119/76  Pulse: 84  Temp: 98 F (36.7 C)  TempSrc: Oral  SpO2: 98%  Weight: 169 lb 3.2 oz (76.7 kg)     Physical Exam Constitutional:      Appearance: Normal appearance.  Cardiovascular:     Rate and Rhythm: Normal rate and regular rhythm.     Heart sounds: Normal heart sounds.  Pulmonary:     Effort: Pulmonary effort is normal.     Breath sounds: Normal breath sounds.  Skin:    General: Skin is warm and dry.  Neurological:     Mental Status: He is alert.  Psychiatric:        Mood and Affect: Mood normal.        Behavior: Behavior normal.         Assessment & Plan    1. Type 2  diabetes mellitus without complication, without long-term current use of insulin (HCC)  A1c today is 9.7%. Metformin has been discontinued completely due to diarrhea. He was on farxiga 5 mg daily which he tolerated well. Fasting sugars running in 120's to 130's daily and have leveled off. Will increase to farxiga 10 mg daily.  - POCT HgB A1C - dapagliflozin propanediol (FARXIGA) 10 MG TABS tablet; Take 10 mg by mouth daily.  Dispense: 900 mg; Refill: 0  2. Hyperlipidemia associated with type 2 diabetes mellitus (HCC)  Continue Lipitor.   F/u 3 months for DM, HTN.   The entirety of the information documented in the History of Present Illness, Review of Systems and Physical Exam were personally obtained by me. Portions of this information were initially documented by Idelle Jo, CMA and reviewed by me for thoroughness and accuracy.        Trinna Post, PA-C  [facility]

## 2018-09-20 NOTE — Telephone Encounter (Signed)
Patient is being seen in office today

## 2018-12-21 ENCOUNTER — Other Ambulatory Visit: Payer: Self-pay

## 2018-12-21 ENCOUNTER — Ambulatory Visit (INDEPENDENT_AMBULATORY_CARE_PROVIDER_SITE_OTHER): Payer: BC Managed Care – PPO | Admitting: Physician Assistant

## 2018-12-21 ENCOUNTER — Other Ambulatory Visit: Payer: Self-pay | Admitting: Physician Assistant

## 2018-12-21 ENCOUNTER — Encounter: Payer: Self-pay | Admitting: Physician Assistant

## 2018-12-21 VITALS — BP 130/74 | HR 83 | Temp 98.2°F | Wt 168.2 lb

## 2018-12-21 DIAGNOSIS — E119 Type 2 diabetes mellitus without complications: Secondary | ICD-10-CM

## 2018-12-21 DIAGNOSIS — F331 Major depressive disorder, recurrent, moderate: Secondary | ICD-10-CM

## 2018-12-21 LAB — POCT GLYCOSYLATED HEMOGLOBIN (HGB A1C)
Est. average glucose Bld gHb Est-mCnc: 275
Hemoglobin A1C: 11.2 % — AB (ref 4.0–5.6)

## 2018-12-21 MED ORDER — FARXIGA 10 MG PO TABS: 10.0000 mg | ORAL_TABLET | Freq: Every day | ORAL | 0 refills | Status: DC

## 2018-12-21 NOTE — Progress Notes (Signed)
Patient: Jeffrey Velazquez Male    DOB: 05/13/1961   58 y.o.   MRN: [id number] Visit Date: 01/02/2019  Today's Provider: Trinna Post, PA-C   Chief Complaint  Patient presents with  . Diabetes  . Hyperlipidemia   Subjective:     HPI  Diabetes Mellitus Type II, Follow-up:   Lab Results  Component Value Date   HGBA1C 10.9 (H) 12/21/2018   HGBA1C 11.2 (A) 12/21/2018   HGBA1C 9.7 (A) 09/20/2018   Last seen for diabetes 3 months ago.  Management since then includes: increased Farxiga to 10 MG daily. He reports good compliance with treatment. He is not having side effects.  Current symptoms include none and have been stable. Home blood sugar records: fasting range: 120-130  Episodes of hypoglycemia? no   Current Insulin Regimen: none Most Recent Eye Exam: 04/29/2019 normal Weight trend: stable Prior visit with dietician: no Current diet: well balanced Current exercise: walking  ------------------------------------------------------------------------    Lipid/Cholesterol, Follow-up:   Last seen for this 3 months ago.  Management since that visit includes none.  Last Lipid Panel:    Component Value Date/Time   CHOL 149 12/21/2018 1600   TRIG 190 (H) 12/21/2018 1600   HDL 44 12/21/2018 1600   CHOLHDL 3.4 12/21/2018 1600   CHOLHDL 3.5 02/20/2017 0729   VLDL 43 (H) 02/20/2017 0729   LDLCALC 67 12/21/2018 1600    He reports good compliance with treatment. He is not having side effects.   Wt Readings from Last 3 Encounters:  12/21/18 168 lb 3.2 oz (76.3 kg)  09/20/18 169 lb 3.2 oz (76.7 kg)  08/01/18 168 lb (76.2 kg)    ------------------------------------------------------------------------   Allergies  Allergen Reactions  . Codeine Nausea Only and Other (See Comments)    dizziness  . Trazodone And Nefazodone     Pt states "I can't walk".      Current Outpatient Medications:  .  atorvastatin (LIPITOR) 20 MG tablet, Take 1 tablet (20  mg total) by mouth daily., Disp: 90 tablet, Rfl: 3 .  Blood Glucose Monitoring Suppl (ONE TOUCH ULTRA 2) w/Device KIT, USE UTD, Disp: , Rfl:  .  dapagliflozin propanediol (FARXIGA) 10 MG TABS tablet, Take 10 mg by mouth daily., Disp: 90 tablet, Rfl: 0 .  Lancets 30G MISC, by Does not apply route., Disp: , Rfl:  .  Melatonin 3 MG TABS, Take 9 mg by mouth., Disp: , Rfl:  .  Multiple Vitamin (MULTI-VITAMINS) TABS, Take by mouth., Disp: , Rfl:  .  Dulaglutide (TRULICITY) 0.07 MA/2.6JF SOPN, Inject 0.75 mg into the skin once a week., Disp: 4 pen, Rfl: 3  Review of Systems  Constitutional: Negative.   Respiratory: Negative.   Cardiovascular: Negative.   Genitourinary: Negative.     Social History   Tobacco Use  . Smoking status: Never Smoker  . Smokeless tobacco: Never Used  Substance Use Topics  . Alcohol use: No      Objective:   BP 130/74 (BP Location: Left Arm, Patient Position: Sitting, Cuff Size: Normal)   Pulse 83   Temp 98.2 F (36.8 C) (Oral)   Wt 168 lb 3.2 oz (76.3 kg)   BMI 25.57 kg/m  Vitals:   12/21/18 1521  BP: 130/74  Pulse: 83  Temp: 98.2 F (36.8 C)  TempSrc: Oral  Weight: 168 lb 3.2 oz (76.3 kg)     Physical Exam Constitutional:      Appearance: Normal appearance.  Cardiovascular:  Rate and Rhythm: Normal rate and regular rhythm.     Heart sounds: Normal heart sounds.  Pulmonary:     Effort: Pulmonary effort is normal.     Breath sounds: Normal breath sounds.  Skin:    General: Skin is warm and dry.  Neurological:     Mental Status: He is alert and oriented to person, place, and time. Mental status is at baseline.  Psychiatric:        Mood and Affect: Mood normal.        Behavior: Behavior normal.      Results for orders placed or performed in visit on 12/21/18  Lipid panel  Result Value Ref Range   Cholesterol, Total 149 100 - 199 mg/dL   Triglycerides 190 (H) 0 - 149 mg/dL   HDL 44 >39 mg/dL   VLDL Cholesterol Cal 38 5 - 40 mg/dL    LDL Calculated 67 0 - 99 mg/dL   Chol/HDL Ratio 3.4 0.0 - 5.0 ratio  Hemoglobin A1c  Result Value Ref Range   Hgb A1c MFr Bld 10.9 (H) 4.8 - 5.6 %   Est. average glucose Bld gHb Est-mCnc 266 mg/dL  Results for orders placed or performed in visit on 12/21/18  POCT glycosylated hemoglobin (Hb A1C)  Result Value Ref Range   Hemoglobin A1C 11.2 (A) 4.0 - 5.6 %   HbA1c POC (<> result, manual entry)     HbA1c, POC (prediabetic range)     HbA1c, POC (controlled diabetic range)     Est. average glucose Bld gHb Est-mCnc 275        Assessment & Plan    1. Type 2 diabetes mellitus without complication, without long-term current use of insulin (HCC)  A1c increased from 9.7% to 11.2% after increasing Farxiga to 10 mg daily. Patient reports he does not miss any doses of medications. He reports his fasting sugars are 120-130. I am going to recheck his a1c through our lab. If it truly is 11.2, he may be having postprandial spikes and he will need additional medication. Have gone over options including GLP1, jardiance and glipizide. Would lean towards trulicity for him. We will await the repeat A1c to determine treatment and follow up.  A1c uncontrolled. Start trulicity.    - POCT glycosylated hemoglobin (Hb A1C) - HgB A1c - Lipid Profile - dapagliflozin propanediol (FARXIGA) 10 MG TABS tablet; Take 10 mg by mouth daily.  Dispense: 90 tablet; Refill: 0  2. MDD (major depressive disorder), recurrent episode, moderate ([place])  Patient declines medication.  The entirety of the information documented in the History of Present Illness, Review of Systems and Physical Exam were personally obtained by me. Portions of this information were initially documented by Jennings Books, CMA and reviewed by me for thoroughness and accuracy.           Trinna Post, PA-C  [facility]

## 2018-12-22 LAB — LIPID PANEL
Chol/HDL Ratio: 3.4 ratio (ref 0.0–5.0)
Cholesterol, Total: 149 mg/dL (ref 100–199)
HDL: 44 mg/dL (ref >39)
LDL Calculated: 67 mg/dL (ref 0–99)
Triglycerides: 190 mg/dL — ABNORMAL HIGH (ref 0–149)
VLDL Cholesterol Cal: 38 mg/dL (ref 5–40)

## 2018-12-22 LAB — HEMOGLOBIN A1C
Est. average glucose Bld gHb Est-mCnc: 266 mg/dL
Hgb A1c MFr Bld: 10.9 % — ABNORMAL HIGH (ref 4.8–5.6)

## 2018-12-23 ENCOUNTER — Telehealth: Payer: Self-pay

## 2018-12-23 MED ORDER — TRULICITY 0.75 MG/0.5ML ~~LOC~~ SOAJ: 0.7500 mg | SUBCUTANEOUS | 3 refills | Status: DC

## 2018-12-23 NOTE — Telephone Encounter (Signed)
-----   Message from Trinna Post, [place] sent at 12/22/2018  2:26 PM EDT ----- A1c is 10.9% on repeat. Cholesterol is much better controlled now. He will need another diabetes medication and I do not think an oral medication will be enough to reduce his A1c. I would recommend the injection trulicity which is once weekly. If patient is agreeable please send in trulicity 2.44 mg subQ once weekly, 3 months supply. Please have him follow up in one month and check his fasting blood sugars and write them down.

## 2018-12-23 NOTE — Telephone Encounter (Signed)
Patient advised. RX sent to [place]. 1 month follow up scheduled.

## 2019-01-27 ENCOUNTER — Encounter: Payer: Self-pay | Admitting: Physician Assistant

## 2019-01-27 ENCOUNTER — Other Ambulatory Visit: Payer: Self-pay

## 2019-01-27 ENCOUNTER — Ambulatory Visit: Payer: BC Managed Care – PPO | Admitting: Physician Assistant

## 2019-01-27 VITALS — BP 121/85 | HR 84 | Temp 98.5°F | Resp 16 | Ht 68.0 in | Wt 165.4 lb

## 2019-01-27 DIAGNOSIS — Z23 Encounter for immunization: Secondary | ICD-10-CM

## 2019-01-27 DIAGNOSIS — N529 Male erectile dysfunction, unspecified: Secondary | ICD-10-CM | POA: Diagnosis not present

## 2019-01-27 DIAGNOSIS — E1142 Type 2 diabetes mellitus with diabetic polyneuropathy: Secondary | ICD-10-CM | POA: Diagnosis not present

## 2019-01-27 DIAGNOSIS — E119 Type 2 diabetes mellitus without complications: Secondary | ICD-10-CM | POA: Diagnosis not present

## 2019-01-27 LAB — POCT GLYCOSYLATED HEMOGLOBIN (HGB A1C)
Est. average glucose Bld gHb Est-mCnc: 197
Hemoglobin A1C: 8.5 % — AB (ref 4.0–5.6)

## 2019-01-27 MED ORDER — TRULICITY 1.5 MG/0.5ML ~~LOC~~ SOAJ: 1.5000 mg | SUBCUTANEOUS | 2 refills | Status: DC

## 2019-01-27 NOTE — Patient Instructions (Signed)
Diabetes Mellitus and Exercise Exercising regularly is important for your overall health, especially when you have diabetes (diabetes mellitus). Exercising is not only about losing weight. It has many other health benefits, such as increasing muscle strength and bone density and reducing body fat and stress. This leads to improved fitness, flexibility, and endurance, all of which result in better overall health. Exercise has additional benefits for people with diabetes, including:  Reducing appetite.  Helping to lower and control blood glucose.  Lowering blood pressure.  Helping to control amounts of fatty substances (lipids) in the blood, such as cholesterol and triglycerides.  Helping the body to respond better to insulin (improving insulin sensitivity).  Reducing how much insulin the body needs.  Decreasing the risk for heart disease by: ? Lowering cholesterol and triglyceride levels. ? Increasing the levels of good cholesterol. ? Lowering blood glucose levels. What is my activity plan? Your health care provider or certified diabetes educator can help you make a plan for the type and frequency of exercise (activity plan) that works for you. Make sure that you:  Do at least 150 minutes of moderate-intensity or vigorous-intensity exercise each week. This could be brisk walking, biking, or water aerobics. ? Do stretching and strength exercises, such as yoga or weightlifting, at least 2 times a week. ? Spread out your activity over at least 3 days of the week.  Get some form of physical activity every day. ? Do not go more than 2 days in a row without some kind of physical activity. ? Avoid being inactive for more than 30 minutes at a time. Take frequent breaks to walk or stretch.  Choose a type of exercise or activity that you enjoy, and set realistic goals.  Start slowly, and gradually increase the intensity of your exercise over time. What do I need to know about managing my  diabetes?   Check your blood glucose before and after exercising. ? If your blood glucose is 240 mg/dL (13.3 mmol/L) or higher before you exercise, check your urine for ketones. If you have ketones in your urine, do not exercise until your blood glucose returns to normal. ? If your blood glucose is 100 mg/dL (5.6 mmol/L) or lower, eat a snack containing 15-20 grams of carbohydrate. Check your blood glucose 15 minutes after the snack to make sure that your level is above 100 mg/dL (5.6 mmol/L) before you start your exercise.  Know the symptoms of low blood glucose (hypoglycemia) and how to treat it. Your risk for hypoglycemia increases during and after exercise. Common symptoms of hypoglycemia can include: ? Hunger. ? Anxiety. ? Sweating and feeling clammy. ? Confusion. ? Dizziness or feeling light-headed. ? Increased heart rate or palpitations. ? Blurry vision. ? Tingling or numbness around the mouth, lips, or tongue. ? Tremors or shakes. ? Irritability.  Keep a rapid-acting carbohydrate snack available before, during, and after exercise to help prevent or treat hypoglycemia.  Avoid injecting insulin into areas of the body that are going to be exercised. For example, avoid injecting insulin into: ? The arms, when playing tennis. ? The legs, when jogging.  Keep records of your exercise habits. Doing this can help you and your health care provider adjust your diabetes management plan as needed. Write down: ? Food that you eat before and after you exercise. ? Blood glucose levels before and after you exercise. ? The type and amount of exercise you have done. ? When your insulin is expected to peak, if you use   insulin. Avoid exercising at times when your insulin is peaking.  When you start a new exercise or activity, work with your health care provider to make sure the activity is safe for you, and to adjust your insulin, medicines, or food intake as needed.  Drink plenty of water while  you exercise to prevent dehydration or heat stroke. Drink enough fluid to keep your urine clear or pale yellow. Summary  Exercising regularly is important for your overall health, especially when you have diabetes (diabetes mellitus).  Exercising has many health benefits, such as increasing muscle strength and bone density and reducing body fat and stress.  Your health care provider or certified diabetes educator can help you make a plan for the type and frequency of exercise (activity plan) that works for you.  When you start a new exercise or activity, work with your health care provider to make sure the activity is safe for you, and to adjust your insulin, medicines, or food intake as needed. This information is not intended to replace advice given to you by your health care provider. Make sure you discuss any questions you have with your health care provider. Document Released: 07/25/2003 Document Revised: 11/26/2016 Document Reviewed: 10/14/2015 Elsevier Patient Education  2020 [employer]vier Inc[employer]  

## 2019-01-27 NOTE — Progress Notes (Signed)
Patient: Jeffrey Velazquez Male    DOB: 08-31-1960   58 y.o.   MRN: [id number] Visit Date: 01/27/2019  Today's Provider: Trinna Post, PA-C   Chief Complaint  Patient presents with  . Follow-up    T2DM   Subjective:    I,Joseline E. Rosas,RMA am acting as a Education administrator for [employer], PA-C.  HPI  Diabetes Mellitus Type II, Follow-up:   Lab Results  Component Value Date   HGBA1C 8.5 (A) 01/27/2019   HGBA1C 10.9 (H) 12/21/2018   HGBA1C 11.2 (A) 12/21/2018    Last seen for diabetes 1 months ago.  Management since then includes recommended the injection 0.78 mg Trulicity which is once weekly. He is also still taking farxiga 10 mg daily. He is intolerant to metformin due to persistent GI side effects. He reports excellent compliance with treatment.  He is not having side effects.  Current symptoms include craving more sweet and have been stable. Home blood sugar records: fasting range: 150-175  Episodes of hypoglycemia? no  He reports he is having a burning, pins and needles sensation in both of his feet for several months. Reports he has taken gabapentin in the past and this hasn't worked.   Pertinent Labs:    Component Value Date/Time   CHOL 149 12/21/2018 1600   TRIG 190 (H) 12/21/2018 1600   HDL 44 12/21/2018 1600   LDLCALC 67 12/21/2018 1600   CREATININE 1.03 06/20/2018 1507    Wt Readings from Last 3 Encounters:  01/27/19 165 lb 6.4 oz (75 kg)  12/21/18 168 lb 3.2 oz (76.3 kg)  09/20/18 169 lb 3.2 oz (76.7 kg)   Erectile Dysfunction: He has tried 50 mg viagra previously without success.  ------------------------------------------------------------------------  Allergies  Allergen Reactions  . Codeine Nausea Only and Other (See Comments)    dizziness  . Trazodone And Nefazodone     Pt states "I can't walk".      Current Outpatient Medications:  .  atorvastatin (LIPITOR) 20 MG tablet, Take 1 tablet (20 mg total) by mouth daily., Disp: 90  tablet, Rfl: 3 .  Blood Glucose Monitoring Suppl (ONE TOUCH ULTRA 2) w/Device KIT, USE UTD, Disp: , Rfl:  .  dapagliflozin propanediol (FARXIGA) 10 MG TABS tablet, Take 10 mg by mouth daily., Disp: 90 tablet, Rfl: 0 .  Lancets 30G MISC, by Does not apply route., Disp: , Rfl:  .  Melatonin 3 MG TABS, Take 9 mg by mouth., Disp: , Rfl:  .  Multiple Vitamin (MULTI-VITAMINS) TABS, Take by mouth., Disp: , Rfl:  .  Dulaglutide (TRULICITY) 1.5 ML/5.4GB SOPN, Inject 1.5 mg into the skin once a week., Disp: 4 pen, Rfl: 2  Review of Systems  Constitutional: Negative.   Eyes: Negative.   Respiratory: Negative.   Cardiovascular: Negative.   Endocrine: Negative.     Social History   Tobacco Use  . Smoking status: Never Smoker  . Smokeless tobacco: Never Used  Substance Use Topics  . Alcohol use: No      Objective:   BP 121/85 (BP Location: Left Arm, Patient Position: Sitting, Cuff Size: Large)   Pulse 84   Temp 98.5 F (36.9 C) (Oral)   Resp 16   Ht '5\' 8"'  (1.727 m)   Wt 165 lb 6.4 oz (75 kg)   BMI 25.15 kg/m  Vitals:   01/27/19 1049  BP: 121/85  Pulse: 84  Resp: 16  Temp: 98.5 F (36.9 C)  TempSrc:  Oral  Weight: 165 lb 6.4 oz (75 kg)  Height: '5\' 8"'  (1.727 m)  Body mass index is 25.15 kg/m.   Physical Exam Constitutional:      Appearance: Normal appearance.  Cardiovascular:     Rate and Rhythm: Normal rate and regular rhythm.     Heart sounds: Normal heart sounds.  Pulmonary:     Effort: Pulmonary effort is normal.     Breath sounds: Normal breath sounds.  Skin:    General: Skin is warm and dry.  Neurological:     Mental Status: He is alert and oriented to person, place, and time. Mental status is at baseline.  Psychiatric:        Mood and Affect: Mood normal.        Behavior: Behavior normal.      Results for orders placed or performed in visit on 01/27/19  POCT glycosylated hemoglobin (Hb A1C)  Result Value Ref Range   Hemoglobin A1C 8.5 (A) 4.0 - 5.6 %    Est. average glucose Bld gHb Est-mCnc 197        Assessment & Plan    1. Type 2 diabetes mellitus without complication, without long-term current use of insulin (HCC)  His A1c has improved from 10.9% to 8.5% on 7.09 mg weekly trulicity. We will increase this to 1.5 mg subQ weekly and see him back in three months. Continue farxiga.   - POCT glycosylated hemoglobin (Hb A1C) - Dulaglutide (TRULICITY) 1.5 KH/5.7MB SOPN; Inject 1.5 mg into the skin once a week.  Dispense: 4 pen; Refill: 2  2. Need for influenza vaccination  - Flu Vaccine QUAD 36+ mos IM  3. Need for shingles vaccine  - Varicella-zoster vaccine IM  4. Diabetic polyneuropathy associated with type 2 diabetes mellitus ([place])  Explained his foot pain is due to diabetic neuropathy. Explained treatments for this includes gabapentin, lyrica, cymbalta, effexor. Patient does not wish to start any of these now but may wish to in the future.   5. Erectile dysfunction, unspecified erectile dysfunction type  Failed vaigra, refer to urology.   - Ambulatory referral to Urology  The entirety of the information documented in the History of Present Illness, Review of Systems and Physical Exam were personally obtained by me. Portions of this information were initially documented by Lyndel Pleasure, CMA and reviewed by me for thoroughness and accuracy.         Trinna Post, PA-C  [facility]

## 2019-03-03 ENCOUNTER — Ambulatory Visit: Payer: BC Managed Care – PPO | Admitting: Urology

## 2019-03-03 ENCOUNTER — Encounter: Payer: Self-pay | Admitting: Urology

## 2019-03-03 ENCOUNTER — Other Ambulatory Visit: Payer: Self-pay

## 2019-03-03 VITALS — BP 101/67 | HR 91 | Ht 68.0 in | Wt 165.0 lb

## 2019-03-03 DIAGNOSIS — N5201 Erectile dysfunction due to arterial insufficiency: Secondary | ICD-10-CM | POA: Diagnosis not present

## 2019-03-03 NOTE — Progress Notes (Signed)
03/03/2019 10:01 AM   Jeffrey Velazquez [birth year] [id number]  Referring provider: Trinna Post, PA-C [address] Jeffrey Velazquez,  [place] [address]  Chief Complaint  Patient presents with  . Erectile Dysfunction    HPI: Jeffrey Velazquez is a 58 y.o. male seen in consultation at the request of Carles Collet, PA-C for evaluation of erectile dysfunction.  He presents with a 3-year history of difficulty achieving and maintaining an erection.  He is able to achieve an erection firm enough for penetration 50% of the time however will rapidly lose the erection.  Denies pain or curvature with erections.  SHIM was 9/25 indicating moderate ED.  Organic risk factors include diabetes and hyperlipidemia.  He was given a trial of sildenafil and May 2018.  He was given a 50 mg dose which he tried on 2 occasions and states it was not effective.   PMH: Past Medical History:  Diagnosis Date  . Anemia   . Anxiety   . Depression   . Diabetes mellitus without complication ([place])   . Diabetes mellitus, type II ([place])   . DOE (dyspnea on exertion)   . Early satiety   . Epigastric abdominal pain   . GERD (gastroesophageal reflux disease)   . Hyperlipidemia   . Irritable bowel syndrome   . Shingles   . Tachycardia   . Unintended weight loss     Surgical History: Past Surgical History:  Procedure Laterality Date  . COLONOSCOPY WITH PROPOFOL N/A 11/19/2016   Procedure: COLONOSCOPY WITH PROPOFOL;  Surgeon: Manya Silvas, MD;  Location: [facility] ENDOSCOPY;  Service: Endoscopy;  Laterality: N/A;  . ESOPHAGOGASTRODUODENOSCOPY (EGD) WITH PROPOFOL N/A 11/06/2016   Procedure: ESOPHAGOGASTRODUODENOSCOPY (EGD) WITH PROPOFOL;  Surgeon: Manya Silvas, MD;  Location: [facility] ENDOSCOPY;  Service: Endoscopy;  Laterality: N/A;  . FRACTURE SURGERY Left 2010   Clavicle  . TONSILLECTOMY    . TOOTH EXTRACTION     wisdom teeth x 4  . VASECTOMY      Home Medications:  Allergies as of 03/03/2019       Reactions   Codeine Nausea Only, Other (See Comments)   dizziness   Trazodone And Nefazodone    Pt states "I can't walk".       Medication List       Accurate as of March 03, 2019 10:01 AM. If you have any questions, ask your nurse or doctor.        atorvastatin 20 MG tablet Commonly known as: LIPITOR Take 1 tablet (20 mg total) by mouth daily.   Farxiga 10 MG Tabs tablet Generic drug: dapagliflozin propanediol Take 10 mg by mouth daily.   Lancets 30G Misc by Does not apply route.   Melatonin 3 MG Tabs Take 9 mg by mouth.   Multi-Vitamins Tabs Take by mouth.   ONE TOUCH ULTRA 2 w/Device Kit USE UTD   Trulicity 1.5 IO/9.7DZ Sopn Generic drug: Dulaglutide Inject 1.5 mg into the skin once a week.       Allergies:  Allergies  Allergen Reactions  . Codeine Nausea Only and Other (See Comments)    dizziness  . Trazodone And Nefazodone     Pt states "I can't walk".     Family History: Family History  Problem Relation Age of Onset  . Diabetes Mother   . Heart disease Mother   . Cancer Mother   . CAD Mother   . COPD Mother   . Heart disease Father   . CAD  Father   . Diabetes Sister   . Emphysema Maternal Grandfather   . Diabetes Paternal Grandmother   . Cancer Paternal Grandfather     Social History:  reports that he has never smoked. He has never used smokeless tobacco. He reports that he does not drink alcohol or use drugs.  ROS: UROLOGY Frequent Urination?: No Hard to postpone urination?: No Burning/pain with urination?: No Get up at night to urinate?: No Leakage of urine?: No Urine stream starts and stops?: No Trouble starting stream?: No Do you have to strain to urinate?: No Blood in urine?: No Urinary tract infection?: No Sexually transmitted disease?: No Injury to kidneys or bladder?: No Painful intercourse?: No Weak stream?: No Erection problems?: No Penile pain?: No  Gastrointestinal Nausea?: No Vomiting?: No  Indigestion/heartburn?: No Diarrhea?: No Constipation?: No  Constitutional Fever: No Night sweats?: No Weight loss?: No Fatigue?: No  Skin Skin rash/lesions?: No Itching?: No  Eyes Blurred vision?: No Double vision?: No  Ears/Nose/Throat Sore throat?: No Sinus problems?: No  Hematologic/Lymphatic Swollen glands?: No Easy bruising?: No  Cardiovascular Leg swelling?: No Chest pain?: No  Respiratory Cough?: No Shortness of breath?: No  Endocrine Excessive thirst?: No  Musculoskeletal Back pain?: No Joint pain?: No  Neurological Headaches?: No Dizziness?: No  Psychologic Depression?: No Anxiety?: No  Physical Exam: BP 101/67   Pulse 91   Ht _0  (1.727 m)   Wt 165 lb (74.8 kg)   BMI 25.09 kg/m   Constitutional:  Alert and oriented, No acute distress. HEENT: [place] AT, moist mucus membranes.  Trachea midline, no masses. Cardiovascular: No clubbing, cyanosis, or edema. Respiratory: Normal respiratory effort, no increased work of breathing. GI: Abdomen is soft, nontender, nondistended, no abdominal masses GU: Phallus without lesions/plaques.  Testes descended bilaterally without masses or tenderness.  Spermatic cord/epididymis palpably normal bilaterally. Lymph: No cervical or inguinal lymphadenopathy. Skin: No rashes, bruises or suspicious lesions. Neurologic: Grossly intact, no focal deficits, moving all 4 extremities. Psychiatric: Normal mood and affect.  Assessment & Plan:    - Erectile dysfunction We discussed the vast majority of ED is related to blood flow abnormalities.  He has not had an adequate trial of a PDE 5 inhibitor.  I initially recommended a trial of generic tadalafil 20 mg 1 hour prior to intercourse and to try on at least 5 occasions.  If his erections improved but he has difficulty maintaining we discussed the possibility of venoocclusive disease and would add a venous compression band.  He was given website information on obtaining a  compression band if needed.  Intracavernosal injections and vacuum erection devices were also discussed.  He will call back if tadalafil and/or compression band is not effective and desires to pursue further management of his ED   Abbie Sons, MD  [place] [address] [place], [place] [address] [phone]

## 2019-03-05 ENCOUNTER — Encounter: Payer: Self-pay | Admitting: Urology

## 2019-03-13 ENCOUNTER — Telehealth: Payer: Self-pay | Admitting: Urology

## 2019-03-13 NOTE — Telephone Encounter (Signed)
Pt LMOM and states that he was supposed to have an RX called into [facility] and it has not been called in.

## 2019-03-14 NOTE — Telephone Encounter (Signed)
Called and left patient a message to clarify what medication he would like sent in Tadalafil 20mg ?

## 2019-03-15 MED ORDER — TADALAFIL 20 MG PO TABS: 20.0000 mg | ORAL_TABLET | Freq: Every day | ORAL | 0 refills | Status: DC | PRN

## 2019-03-15 NOTE — Telephone Encounter (Signed)
Pt called back and states that it is Tadalafil that needs to be called into [facility]

## 2019-03-15 NOTE — Telephone Encounter (Signed)
Script was sent

## 2019-03-25 ENCOUNTER — Other Ambulatory Visit: Payer: Self-pay | Admitting: Physician Assistant

## 2019-03-25 DIAGNOSIS — E119 Type 2 diabetes mellitus without complications: Secondary | ICD-10-CM

## 2019-03-27 NOTE — Telephone Encounter (Signed)
L.O.V. was on 01/27/2019 and upcoming appointment on 04/28/2019.

## 2019-04-12 ENCOUNTER — Other Ambulatory Visit: Payer: Self-pay | Admitting: Urology

## 2019-04-28 ENCOUNTER — Other Ambulatory Visit: Payer: Self-pay

## 2019-04-28 ENCOUNTER — Ambulatory Visit: Payer: BC Managed Care – PPO | Admitting: Physician Assistant

## 2019-04-28 ENCOUNTER — Encounter: Payer: Self-pay | Admitting: Physician Assistant

## 2019-04-28 VITALS — BP 130/74 | HR 93 | Temp 96.9°F | Resp 16 | Ht 68.0 in | Wt 168.0 lb

## 2019-04-28 DIAGNOSIS — E1169 Type 2 diabetes mellitus with other specified complication: Secondary | ICD-10-CM | POA: Diagnosis not present

## 2019-04-28 DIAGNOSIS — E785 Hyperlipidemia, unspecified: Secondary | ICD-10-CM

## 2019-04-28 DIAGNOSIS — E119 Type 2 diabetes mellitus without complications: Secondary | ICD-10-CM

## 2019-04-28 LAB — POCT GLYCOSYLATED HEMOGLOBIN (HGB A1C)
Est. average glucose Bld gHb Est-mCnc: 166
Hemoglobin A1C: 7.4 % — AB (ref 4.0–5.6)

## 2019-04-28 NOTE — Patient Instructions (Signed)
Diabetes Mellitus and Exercise Exercising regularly is important for your overall health, especially when you have diabetes (diabetes mellitus). Exercising is not only about losing weight. It has many other health benefits, such as increasing muscle strength and bone density and reducing body fat and stress. This leads to improved fitness, flexibility, and endurance, all of which result in better overall health. Exercise has additional benefits for people with diabetes, including:  Reducing appetite.  Helping to lower and control blood glucose.  Lowering blood pressure.  Helping to control amounts of fatty substances (lipids) in the blood, such as cholesterol and triglycerides.  Helping the body to respond better to insulin (improving insulin sensitivity).  Reducing how much insulin the body needs.  Decreasing the risk for heart disease by: ? Lowering cholesterol and triglyceride levels. ? Increasing the levels of good cholesterol. ? Lowering blood glucose levels. What is my activity plan? Your health care provider or certified diabetes educator can help you make a plan for the type and frequency of exercise (activity plan) that works for you. Make sure that you:  Do at least 150 minutes of moderate-intensity or vigorous-intensity exercise each week. This could be brisk walking, biking, or water aerobics. ? Do stretching and strength exercises, such as yoga or weightlifting, at least 2 times a week. ? Spread out your activity over at least 3 days of the week.  Get some form of physical activity every day. ? Do not go more than 2 days in a row without some kind of physical activity. ? Avoid being inactive for more than 30 minutes at a time. Take frequent breaks to walk or stretch.  Choose a type of exercise or activity that you enjoy, and set realistic goals.  Start slowly, and gradually increase the intensity of your exercise over time. What do I need to know about managing my  diabetes?   Check your blood glucose before and after exercising. ? If your blood glucose is 240 mg/dL (13.3 mmol/L) or higher before you exercise, check your urine for ketones. If you have ketones in your urine, do not exercise until your blood glucose returns to normal. ? If your blood glucose is 100 mg/dL (5.6 mmol/L) or lower, eat a snack containing 15-20 grams of carbohydrate. Check your blood glucose 15 minutes after the snack to make sure that your level is above 100 mg/dL (5.6 mmol/L) before you start your exercise.  Know the symptoms of low blood glucose (hypoglycemia) and how to treat it. Your risk for hypoglycemia increases during and after exercise. Common symptoms of hypoglycemia can include: ? Hunger. ? Anxiety. ? Sweating and feeling clammy. ? Confusion. ? Dizziness or feeling light-headed. ? Increased heart rate or palpitations. ? Blurry vision. ? Tingling or numbness around the mouth, lips, or tongue. ? Tremors or shakes. ? Irritability.  Keep a rapid-acting carbohydrate snack available before, during, and after exercise to help prevent or treat hypoglycemia.  Avoid injecting insulin into areas of the body that are going to be exercised. For example, avoid injecting insulin into: ? The arms, when playing tennis. ? The legs, when jogging.  Keep records of your exercise habits. Doing this can help you and your health care provider adjust your diabetes management plan as needed. Write down: ? Food that you eat before and after you exercise. ? Blood glucose levels before and after you exercise. ? The type and amount of exercise you have done. ? When your insulin is expected to peak, if you use   insulin. Avoid exercising at times when your insulin is peaking.  When you start a new exercise or activity, work with your health care provider to make sure the activity is safe for you, and to adjust your insulin, medicines, or food intake as needed.  Drink plenty of water while  you exercise to prevent dehydration or heat stroke. Drink enough fluid to keep your urine clear or pale yellow. Summary  Exercising regularly is important for your overall health, especially when you have diabetes (diabetes mellitus).  Exercising has many health benefits, such as increasing muscle strength and bone density and reducing body fat and stress.  Your health care provider or certified diabetes educator can help you make a plan for the type and frequency of exercise (activity plan) that works for you.  When you start a new exercise or activity, work with your health care provider to make sure the activity is safe for you, and to adjust your insulin, medicines, or food intake as needed. This information is not intended to replace advice given to you by your health care provider. Make sure you discuss any questions you have with your health care provider. Document Released: 07/25/2003 Document Revised: 11/26/2016 Document Reviewed: 10/14/2015 Elsevier Patient Education  2020 [employer]vier Inc[employer]  

## 2019-04-28 NOTE — Progress Notes (Signed)
Patient: Jeffrey Velazquez Male    DOB: July 01, 1960   58 y.o.   MRN: [id number] Visit Date: 05/04/2019  Today's Provider: Trinna Post, PA-C   Chief Complaint  Patient presents with  . Follow-up  . Diabetes   Subjective:     HPI    Diabetes Mellitus Type II, Follow-up:   Lab Results  Component Value Date   HGBA1C 7.4 (A) 04/28/2019   HGBA1C 8.5 (A) 01/27/2019   HGBA1C 10.9 (H) 12/21/2018   Last seen for diabetes 3 months ago.  Management since then includes;increased this to 1.5 mg subQ weekly and see him back in three months. Continue farxiga.  Marland Kitchen He reports good compliance with treatment. He is not having side effects. NONE Current symptoms include none and have been unchanged. Home blood sugar records: fasting range: 125  Episodes of hypoglycemia? no   Current Insulin Regimen: N/A Most Recent Eye Exam: 04/28/2018 Weight trend: stable Prior visit with dietician: no Current diet: not asked Current exercise: not asked  -----------------------------------------------------------    Lipid/Cholesterol, Follow-up:   Last seen for this 4 months ago.  Management since that visit includes; labs checked, no changes.  Last Lipid Panel:    Component Value Date/Time   CHOL 149 12/21/2018 1600   TRIG 190 (H) 12/21/2018 1600   HDL 44 12/21/2018 1600   CHOLHDL 3.4 12/21/2018 1600   CHOLHDL 3.5 02/20/2017 0729   VLDL 43 (H) 02/20/2017 0729   LDLCALC 67 12/21/2018 1600    He reports good compliance with treatment. He is not having side effects. NONE  Wt Readings from Last 3 Encounters:  04/28/19 168 lb (76.2 kg)  03/03/19 165 lb (74.8 kg)  01/27/19 165 lb 6.4 oz (75 kg)    -----------------------------------------------------------     Allergies  Allergen Reactions  . Codeine Nausea Only and Other (See Comments)    dizziness  . Trazodone And Nefazodone     Pt states "I can't walk".      Current Outpatient Medications:  .  atorvastatin  (LIPITOR) 20 MG tablet, Take 1 tablet (20 mg total) by mouth daily., Disp: 90 tablet, Rfl: 3 .  Blood Glucose Monitoring Suppl (ONE TOUCH ULTRA 2) w/Device KIT, USE UTD, Disp: , Rfl:  .  Dulaglutide (TRULICITY) 1.5 LO/7.5IE SOPN, Inject 1.5 mg into the skin once a week., Disp: 4 pen, Rfl: 2 .  FARXIGA 10 MG TABS tablet, TAKE ONE TABLET BY MOUTH DAILY, Disp: 90 tablet, Rfl: 0 .  Lancets 30G MISC, by Does not apply route., Disp: , Rfl:  .  tadalafil (CIALIS) 20 MG tablet, TAKE ONE TABLET BY MOUTH DAILY AS NEEDED FOR ERECTILE DYSFUNCTION; TAKE ONE HOUR PRIOR TO INTERCOURSE, Disp: 6 tablet, Rfl: 0 .  Melatonin 3 MG TABS, Take 9 mg by mouth., Disp: , Rfl:  .  Multiple Vitamin (MULTI-VITAMINS) TABS, Take by mouth., Disp: , Rfl:   Review of Systems  Constitutional: Negative for appetite change, chills and fever.  Respiratory: Negative for chest tightness, shortness of breath and wheezing.   Cardiovascular: Negative for chest pain and palpitations.  Gastrointestinal: Negative for abdominal pain, nausea and vomiting.    Social History   Tobacco Use  . Smoking status: Never Smoker  . Smokeless tobacco: Never Used  Substance Use Topics  . Alcohol use: No      Objective:   BP 130/74 (BP Location: Right Arm, Patient Position: Lying right side, Cuff Size: Large)   Pulse 93  Temp (!) 96.9 F (36.1 C) (Other (Comment))   Resp 16   Ht _0  (1.727 m)   Wt 168 lb (76.2 kg)   SpO2 98%   BMI 25.54 kg/m  Vitals:   04/28/19 1340  BP: 130/74  Pulse: 93  Resp: 16  Temp: (!) 96.9 F (36.1 C)  TempSrc: Other (Comment)  SpO2: 98%  Weight: 168 lb (76.2 kg)  Height: _1  (1.727 m)  Body mass index is 25.54 kg/m.   Physical Exam Constitutional:      Appearance: Normal appearance.  Cardiovascular:     Rate and Rhythm: Normal rate and regular rhythm.     Heart sounds: Normal heart sounds.  Pulmonary:     Effort: Pulmonary effort is normal.     Breath sounds: Normal breath sounds.    Skin:    General: Skin is warm and dry.  Neurological:     Mental Status: He is alert and oriented to person, place, and time. Mental status is at baseline.  Psychiatric:        Mood and Affect: Mood normal.        Behavior: Behavior normal.      Results for orders placed or performed in visit on 04/28/19  POCT HgB A1C  Result Value Ref Range   Hemoglobin A1C 7.4 (A) 4.0 - 5.6 %   Est. average glucose Bld gHb Est-mCnc 166        Assessment & Plan    1. Type 2 diabetes mellitus without complication, without long-term current use of insulin ([place])  He will continue to work on dietary changes and we will recheck at next visit. He is due for an eye exam.   - POCT HgB A1C  2. Hyperlipidemia associated with type 2 diabetes mellitus ([facility])  Continue Lipitor 20 mg QHS.   The entirety of the information documented in the History of Present Illness, Review of Systems and Physical Exam were personally obtained by me. Portions of this information were initially documented by April M. Sabra Heck, CMA and reviewed by me for thoroughness and accuracy.   F/u 3 months DM II, HLD      Trinna Post, PA-C  [facility]

## 2019-06-22 LAB — CBC AND DIFFERENTIAL
HCT: 39 — AB (ref 41–53)
Hemoglobin: 14.1 (ref 13.5–17.5)
WBC: 8.6

## 2019-06-22 LAB — BASIC METABOLIC PANEL
BUN: 168 — AB (ref 4–21)
CO2: 20 (ref 13–22)
Chloride: 103 (ref 99–108)
Creatinine: 1.3 (ref <=1.3)
Potassium: 4.8 (ref 3.4–5.3)
Sodium: 137 (ref 137–147)

## 2019-06-22 LAB — HEPATIC FUNCTION PANEL
ALT: 17 (ref 10–40)
AST: 24 (ref 14–40)
Alkaline Phosphatase: 105 (ref 25–125)
Bilirubin, Total: 1.6

## 2019-06-22 LAB — COMPREHENSIVE METABOLIC PANEL
Albumin: 4.6 (ref 3.5–5.0)
Calcium: 9.6 (ref 8.7–10.7)
GFR calc non Af Amer: 63
Globulin: 3.3

## 2019-06-22 LAB — LIPID PANEL
Cholesterol: 143 (ref 0–200)
HDL: 48 (ref 35–70)
Triglycerides: 118 (ref 40–160)

## 2019-06-22 LAB — CBC: RBC: 4.44 (ref 3.87–5.11)

## 2019-06-22 LAB — PSA: PSA: 0.3

## 2019-06-22 LAB — TSH: TSH: 1.13 (ref <=5.90)

## 2019-06-22 LAB — HEMOGLOBIN A1C: Hemoglobin A1C: 7.8

## 2019-07-14 ENCOUNTER — Other Ambulatory Visit: Payer: Self-pay | Admitting: Physician Assistant

## 2019-07-14 DIAGNOSIS — E119 Type 2 diabetes mellitus without complications: Secondary | ICD-10-CM

## 2019-07-14 NOTE — Telephone Encounter (Signed)
Requested Prescriptions  Pending Prescriptions Disp Refills  . FARXIGA 10 MG TABS tablet [Pharmacy Med Name: FARXIGA 10 MG TABLET] 90 tablet 0    Sig: TAKE ONE TABLET BY MOUTH DAILY     Endocrinology:  Diabetes - SGLT2 Inhibitors Failed - 07/14/2019  3:17 PM      Failed - Cr in normal range and within 360 days    Creatinine, Ser  Date Value Ref Range Status  06/20/2018 1.03 0.76 - 1.27 mg/dL Final         Failed - eGFR in normal range and within 360 days    GFR calc Af Amer  Date Value Ref Range Status  06/20/2018 94 >59 mL/min/1.73 Final   GFR calc non Af Amer  Date Value Ref Range Status  06/20/2018 81 >59 mL/min/1.73 Final         Passed - LDL in normal range and within 360 days    LDL Calculated  Date Value Ref Range Status  12/21/2018 67 0 - 99 mg/dL Final         Passed - HBA1C is between 0 and 7.9 and within 180 days    Hemoglobin A1C  Date Value Ref Range Status  04/28/2019 7.4 (A) 4.0 - 5.6 % Final   Hgb A1c MFr Bld  Date Value Ref Range Status  12/21/2018 10.9 (H) 4.8 - 5.6 % Final    Comment:             Prediabetes: 5.7 - 6.4          Diabetes: >6.4          Glycemic control for adults with diabetes: <7.0          Passed - Valid encounter within last 6 months    Recent Outpatient Visits          2 months ago Type 2 diabetes mellitus without complication, without long-term current use of insulin [facility])   [place], Adriana M, PA-C   5 months ago Type 2 diabetes mellitus without complication, without long-term current use of insulin [facility])   [place], Adriana M, PA-C   6 months ago Type 2 diabetes mellitus without complication, without long-term current use of insulin [facility])   [place], [place], PA-C   9 months ago Type 2 diabetes mellitus without complication, without long-term current use of insulin [facility])   [place], [place], [place]   11 months ago Acute  non-recurrent maxillary sinusitis   [facility] [place], [place], [place]

## 2019-07-27 NOTE — Progress Notes (Signed)
Patient: Jeffrey Velazquez, Male    DOB: Jun 06, 1960, 59 y.o.   MRN: [id number] Visit Date: 07/28/2019  Today's Provider: Trinna Post, PA-C   Chief Complaint  Patient presents with  . Annual Exam   Subjective:     Annual physical exam Jeffrey Velazquez is a 59 y.o. male who presents today for health maintenance and complete physical. He feels fairly well. He reports no exercising . He reports he is sleeping well.  DM: Currently managed with trulicity 1.5 mg subQ once weekly and farxiga 10 mg daily.   Lab Results  Component Value Date   HGBA1C 7.2 (A) 07/28/2019    BP Readings from Last 5 Encounters:  07/28/19 121/79  04/28/19 130/74  03/03/19 101/67  01/27/19 121/85  12/21/18 130/74   HLD: Currently takes lipitor 20 mg QHS without issue.   Lipid Panel     Component Value Date/Time   CHOL 143 06/22/2019 0000   CHOL 149 12/21/2018 1600   TRIG 118 06/22/2019 0000   HDL 48 06/22/2019 0000   HDL 44 12/21/2018 1600   CHOLHDL 3.4 12/21/2018 1600   CHOLHDL 3.5 02/20/2017 0729   VLDL 43 (H) 02/20/2017 0729   LDLCALC 67 12/21/2018 1600   LABVLDL 38 12/21/2018 1600    -----------------------------------------------------------------  Last colonoscopy- 12/09/2016  Review of Systems  Constitutional: Negative.   HENT: Positive for rhinorrhea.   Eyes: Negative.   Respiratory: Negative.   Cardiovascular: Negative.   Endocrine: Negative.   Genitourinary: Negative.   Musculoskeletal: Negative.   Skin: Negative.   Allergic/Immunologic: Negative.   Neurological: Positive for dizziness.  Hematological: Negative.   Psychiatric/Behavioral: Negative.     Social History      He  reports that he has never smoked. He has never used smokeless tobacco. He reports that he does not drink alcohol or use drugs.       Social History   Socioeconomic History  . Marital status: Married    Spouse name: ladana   . Number of children: 2  . Years of education: Not  on file  . Highest education level: High school graduate  Occupational History    Comment: disabled  Tobacco Use  . Smoking status: Never Smoker  . Smokeless tobacco: Never Used  Substance and Sexual Activity  . Alcohol use: No  . Drug use: No  . Sexual activity: Yes    Birth control/protection: None  Other Topics Concern  . Not on file  Social History Narrative  . Not on file   Social Determinants of Health   Financial Resource Strain:   . Difficulty of Paying Living Expenses:   Food Insecurity:   . Worried About Charity fundraiser in the Last Year:   . Arboriculturist in the Last Year:   Transportation Needs:   . Film/video editor (Medical):   Marland Kitchen Lack of Transportation (Non-Medical):   Physical Activity:   . Days of Exercise per Week:   . Minutes of Exercise per Session:   Stress:   . Feeling of Stress :   Social Connections:   . Frequency of Communication with Friends and Family:   . Frequency of Social Gatherings with Friends and Family:   . Attends Religious Services:   . Active Member of Clubs or Organizations:   . Attends Archivist Meetings:   Marland Kitchen Marital Status:     Past Medical History:  Diagnosis Date  . Anemia   .  Anxiety   . Depression   . Diabetes mellitus without complication ([place])   . Diabetes mellitus, type II ([place])   . DOE (dyspnea on exertion)   . Early satiety   . Epigastric abdominal pain   . GERD (gastroesophageal reflux disease)   . Hyperlipidemia   . Irritable bowel syndrome   . Shingles   . Tachycardia   . Unintended weight loss      Patient Active Problem List   Diagnosis Date Noted  . Hyperlipidemia associated with type 2 diabetes mellitus ([place]) 09/20/2018  . BPPV (benign paroxysmal positional vertigo) 06/09/2017  . MDD (major depressive disorder), severe ([place]) 06/07/2017  . Moderate episode of recurrent major depressive disorder ([place]) 03/10/2017  . Diabetes ([place]) 02/21/2017  . GERD (gastroesophageal reflux  disease) 02/21/2017  . Vitamin B12 deficiency 02/21/2017  . Severe episode of recurrent major depressive disorder, with psychotic features ([place]) 02/20/2017  . Insomnia due to mental disorder   . Anxiety with somatization 11/13/2016  . Pain, generalized 11/05/2016  . Chest pain 10/29/2016  . Sinus tachycardia 10/29/2016  . Hyponatremia 10/29/2016  . Hypokalemia 10/29/2016  . Diarrhea, unspecified 10/26/2016  . DOE (dyspnea on exertion) 10/13/2016  . Early satiety 10/13/2016  . Epigastric pain 10/13/2016  . Unintended weight loss 10/13/2016  . Post herpetic neuralgia 07/23/2016  . Hyperlipemia, mixed 06/08/2016  . Type 2 diabetes mellitus without complication, without long-term current use of insulin ([place]) 06/08/2016  . Vaccine counseling 05/21/2016    Past Surgical History:  Procedure Laterality Date  . COLONOSCOPY WITH PROPOFOL N/A 11/19/2016   Procedure: COLONOSCOPY WITH PROPOFOL;  Surgeon: Manya Silvas, MD;  Location: [facility] ENDOSCOPY;  Service: Endoscopy;  Laterality: N/A;  . ESOPHAGOGASTRODUODENOSCOPY (EGD) WITH PROPOFOL N/A 11/06/2016   Procedure: ESOPHAGOGASTRODUODENOSCOPY (EGD) WITH PROPOFOL;  Surgeon: Manya Silvas, MD;  Location: [facility] ENDOSCOPY;  Service: Endoscopy;  Laterality: N/A;  . FRACTURE SURGERY Left 2010   Clavicle  . TONSILLECTOMY    . TOOTH EXTRACTION     wisdom teeth x 4  . VASECTOMY      Family History        Family Status  Relation Name Status  . Mother  Deceased  . Father  Deceased  . Sister  Alive  . MGF  (Not Specified)  . PGM  (Not Specified)  . PGF  (Not Specified)       skin cancer  . Brother  Alive  . Sister  Alive  . Sister  Alive        His family history includes CAD in his father and mother; COPD in his mother; Cancer in his mother and paternal grandfather; Diabetes in his mother, paternal grandmother, and sister; Emphysema in his maternal grandfather; Heart disease in his father and mother.      Allergies  Allergen Reactions   . Codeine Nausea Only and Other (See Comments)    dizziness  . Trazodone And Nefazodone     Pt states "I can't walk".      Current Outpatient Medications:  .  atorvastatin (LIPITOR) 20 MG tablet, Take 1 tablet (20 mg total) by mouth daily., Disp: 90 tablet, Rfl: 3 .  Blood Glucose Monitoring Suppl (ONE TOUCH ULTRA 2) w/Device KIT, USE UTD, Disp: , Rfl:  .  Dulaglutide (TRULICITY) 1.5 RX/5.4MG SOPN, Inject 1.5 mg into the skin once a week., Disp: 4 pen, Rfl: 2 .  FARXIGA 10 MG TABS tablet, TAKE ONE TABLET BY MOUTH DAILY, Disp: 90 tablet, Rfl: 0 .  Lancets 30G MISC, by Does not apply route., Disp: , Rfl:  .  Melatonin 3 MG TABS, Take 9 mg by mouth., Disp: , Rfl:  .  Multiple Vitamin (MULTI-VITAMINS) TABS, Take by mouth., Disp: , Rfl:  .  tadalafil (CIALIS) 20 MG tablet, TAKE ONE TABLET BY MOUTH DAILY AS NEEDED FOR ERECTILE DYSFUNCTION; TAKE ONE HOUR PRIOR TO INTERCOURSE, Disp: 6 tablet, Rfl: 0   Patient Care Team: Paulene Floor as PCP - General (Physician Assistant)    Objective:    Vitals: There were no vitals taken for this visit.  There were no vitals filed for this visit.   Physical Exam Constitutional:      Appearance: Normal appearance.  Cardiovascular:     Rate and Rhythm: Normal rate and regular rhythm.     Heart sounds: Normal heart sounds.  Pulmonary:     Effort: Pulmonary effort is normal.     Breath sounds: Normal breath sounds.  Abdominal:     General: Bowel sounds are normal.     Palpations: Abdomen is soft.  Skin:    General: Skin is warm and dry.  Neurological:     Mental Status: He is alert and oriented to person, place, and time. Mental status is at baseline.  Psychiatric:        Mood and Affect: Mood normal.        Behavior: Behavior normal.      Depression Screen PHQ 2/9 Scores 04/28/2019 01/27/2019 06/20/2018  PHQ - 2 Score 0 1 2  PHQ- 9 Score 0 8 5       Assessment & Plan:     Routine Health Maintenance and Physical  Exam  Exercise Activities and Dietary recommendations Goals   None     Immunization History  Administered Date(s) Administered  . Influenza,inj,Quad PF,6+ Mos 02/27/2017, 06/20/2018, 01/27/2019  . Influenza-Unspecified 02/16/2016  . Pneumococcal Polysaccharide-23 06/20/2018  . Tdap 03/28/2015  . Zoster Recombinat (Shingrix) 01/27/2019    Health Maintenance  Topic Date Due  . OPHTHALMOLOGY EXAM  04/29/2019  . FOOT EXAM  06/21/2019  . URINE MICROALBUMIN  06/21/2019  . HEMOGLOBIN A1C  10/27/2019  . TETANUS/TDAP  03/27/2025  . COLONOSCOPY  11/20/2026  . INFLUENZA VACCINE  Completed  . PNEUMOCOCCAL POLYSACCHARIDE VACCINE AGE 71-64 HIGH RISK  Completed  . Hepatitis C Screening  Completed  . HIV Screening  Completed     Discussed health benefits of physical activity, and encouraged him to engage in regular exercise appropriate for his age and condition.    1. Annual physical exam   2. Type 2 diabetes mellitus without complication, without long-term current use of insulin ([place])  Close to being controlled, will monitor at next visit and make any medication adjustments necessary. Follow up in 6 months.  - POCT HgB A1C - POCT UA - Microalbumin  3. Hyperlipidemia associated with type 2 diabetes mellitus ([place])   4. Prostate cancer screening   5. Need for shingles vaccine  - Varicella-zoster vaccine IM (Shingrix)  6. MDD (major depressive disorder), recurrent episode, moderate ([place])  Not on medication currently, patient doesn't desire medications.   The entirety of the information documented in the History of Present Illness, Review of Systems and Physical Exam were personally obtained by me. Portions of this information were initially documented by Elonda Husky, CMA and reviewed by me for thoroughness and accuracy.    --------------------------------------------------------------------    Trinna Post, PA-C  [facility]

## 2019-07-28 ENCOUNTER — Ambulatory Visit (INDEPENDENT_AMBULATORY_CARE_PROVIDER_SITE_OTHER): Payer: BC Managed Care – PPO | Admitting: Physician Assistant

## 2019-07-28 ENCOUNTER — Encounter: Payer: Self-pay | Admitting: Physician Assistant

## 2019-07-28 ENCOUNTER — Other Ambulatory Visit: Payer: Self-pay

## 2019-07-28 VITALS — BP 121/79 | HR 82 | Temp 96.9°F | Ht 68.0 in | Wt 166.4 lb

## 2019-07-28 DIAGNOSIS — Z0000 Encounter for general adult medical examination without abnormal findings: Secondary | ICD-10-CM | POA: Diagnosis not present

## 2019-07-28 DIAGNOSIS — E1169 Type 2 diabetes mellitus with other specified complication: Secondary | ICD-10-CM

## 2019-07-28 DIAGNOSIS — E119 Type 2 diabetes mellitus without complications: Secondary | ICD-10-CM | POA: Diagnosis not present

## 2019-07-28 DIAGNOSIS — Z125 Encounter for screening for malignant neoplasm of prostate: Secondary | ICD-10-CM

## 2019-07-28 DIAGNOSIS — Z23 Encounter for immunization: Secondary | ICD-10-CM

## 2019-07-28 DIAGNOSIS — F331 Major depressive disorder, recurrent, moderate: Secondary | ICD-10-CM

## 2019-07-28 DIAGNOSIS — E785 Hyperlipidemia, unspecified: Secondary | ICD-10-CM

## 2019-07-28 LAB — POCT GLYCOSYLATED HEMOGLOBIN (HGB A1C)
Estimated Average Glucose: 160
Hemoglobin A1C: 7.2 % — AB (ref 4.0–5.6)

## 2019-07-28 LAB — POCT UA - MICROALBUMIN: Microalbumin Ur, POC: 20 mg/L

## 2019-07-28 NOTE — Patient Instructions (Signed)
Shingls vaccine [employer] where it's paid for.

## 2019-08-09 ENCOUNTER — Other Ambulatory Visit: Payer: Self-pay | Admitting: Physician Assistant

## 2019-08-09 DIAGNOSIS — E119 Type 2 diabetes mellitus without complications: Secondary | ICD-10-CM

## 2019-10-27 ENCOUNTER — Other Ambulatory Visit: Payer: Self-pay | Admitting: Physician Assistant

## 2019-10-27 DIAGNOSIS — E1169 Type 2 diabetes mellitus with other specified complication: Secondary | ICD-10-CM

## 2019-10-27 DIAGNOSIS — E785 Hyperlipidemia, unspecified: Secondary | ICD-10-CM

## 2019-10-27 DIAGNOSIS — E119 Type 2 diabetes mellitus without complications: Secondary | ICD-10-CM

## 2019-10-27 NOTE — Telephone Encounter (Signed)
Requested medication (s) are due for refill today: Yes  Requested medication (s) are on the active medication list: Yes  Last refill:  07/19/18  Future visit scheduled: Yes  Notes to clinic:  Expired Rx, unable to refill per protocol     Requested Prescriptions  Pending Prescriptions Disp Refills   atorvastatin (LIPITOR) 20 MG tablet [Pharmacy Med Name: ATORVASTATIN 20 MG TABLET] 90 tablet 1    Sig: TAKE ONE TABLET BY MOUTH EVERY EVENING      Cardiovascular:  Antilipid - Statins Passed - 10/27/2019  4:08 PM      Passed - Total Cholesterol in normal range and within 360 days    Cholesterol, Total  Date Value Ref Range Status  12/21/2018 149 100 - 199 mg/dL Final   Cholesterol  Date Value Ref Range Status  06/22/2019 143 0 - 200 Final          Passed - LDL in normal range and within 360 days    LDL Calculated  Date Value Ref Range Status  12/21/2018 67 0 - 99 mg/dL Final          Passed - HDL in normal range and within 360 days    HDL  Date Value Ref Range Status  06/22/2019 48 35 - 70 Final  12/21/2018 44 >39 mg/dL Final          Passed - Triglycerides in normal range and within 360 days    Triglycerides  Date Value Ref Range Status  06/22/2019 118 40 - 160 Final          Passed - Patient is not pregnant      Passed - Valid encounter within last 12 months    Recent Outpatient Visits           3 months ago Annual physical exam   [employer], Adriana M, PA-C   6 months ago Type 2 diabetes mellitus without complication, without long-term current use of insulin ([place])   [facility] [place], Adriana M, PA-C   9 months ago Type 2 diabetes mellitus without complication, without long-term current use of insulin [facility])   [facility] [place], Adriana M, PA-C   10 months ago Type 2 diabetes mellitus without complication, without long-term current use of insulin [facility])   [facility] [place], [place], PA-C    1 year ago Type 2 diabetes mellitus without complication, without long-term current use of insulin [facility])   [facility] [place], Wendee Beavers, PA-C       Future Appointments             In 3 months [place], Wendee Beavers, PA-C [employer]             Signed Prescriptions Disp Refills   FARXIGA 10 MG TABS tablet 90 tablet 0    Sig: TAKE ONE TABLET BY MOUTH DAILY      Endocrinology:  Diabetes - SGLT2 Inhibitors Failed - 10/27/2019  4:08 PM      Failed - Valid encounter within last 6 months    Recent Outpatient Visits           3 months ago Annual physical exam   [facility] Carles Collet M, PA-C   6 months ago Type 2 diabetes mellitus without complication, without long-term current use of insulin [facility])   [place], Fabio Bering M, [place]   9 months ago Type 2 diabetes mellitus without complication, without long-term  current use of insulin [facility])   [place], [place], [place]   10 months ago Type 2 diabetes mellitus without complication, without long-term current use of insulin [facility])   [facility] [place], [place], PA-C   1 year ago Type 2 diabetes mellitus without complication, without long-term current use of insulin [facility])   [place], Wendee Beavers, [place]       Future Appointments             In 3 months [place], Adriana M, PA-C [employer]            Passed - Cr in normal range and within 360 days    Creatinine  Date Value Ref Range Status  06/22/2019 1.3 0.6 - 1.3 Final   Creatinine, Ser  Date Value Ref Range Status  06/20/2018 1.03 0.76 - 1.27 mg/dL Final          Passed - LDL in normal range and within 360 days    LDL Calculated  Date Value Ref Range Status  12/21/2018 67 0 - 99 mg/dL Final          Passed - HBA1C is between 0 and 7.9 and within 180 days    Hemoglobin A1C  Date Value Ref Range Status   07/28/2019 7.2 (A) 4.0 - 5.6 % Final  06/22/2019 7.8  Final          Passed - eGFR in normal range and within 360 days    GFR calc Af Amer  Date Value Ref Range Status  06/20/2018 94 >59 mL/min/1.73 Final   GFR calc non Af Amer  Date Value Ref Range Status  06/22/2019 63  Final

## 2019-12-25 DIAGNOSIS — T63481A Toxic effect of venom of other arthropod, accidental (unintentional), initial encounter: Secondary | ICD-10-CM | POA: Diagnosis not present

## 2020-02-02 ENCOUNTER — Other Ambulatory Visit: Payer: Self-pay | Admitting: Physician Assistant

## 2020-02-02 ENCOUNTER — Ambulatory Visit: Payer: Self-pay | Admitting: Physician Assistant

## 2020-02-09 ENCOUNTER — Other Ambulatory Visit: Payer: Self-pay | Admitting: Physician Assistant

## 2020-02-09 DIAGNOSIS — E119 Type 2 diabetes mellitus without complications: Secondary | ICD-10-CM

## 2020-02-16 NOTE — Progress Notes (Signed)
Established patient visit   Patient: Jeffrey Velazquez   DOB: 12-27-1960   58 y.o. Male  MRN: [id number] Visit Date: 02/19/2020  Today's healthcare provider: Trinna Post, PA-C   Chief Complaint  Patient presents with  . Diabetes  . Memory Loss   Subjective    HPI  Diabetes Mellitus Type II, Follow-up  Lab Results  Component Value Date   HGBA1C 8.9 (A) 02/19/2020   HGBA1C 7.2 (A) 07/28/2019   HGBA1C 7.8 06/22/2019   Wt Readings from Last 3 Encounters:  02/19/20 169 lb (76.7 kg)  07/28/19 166 lb 6.4 oz (75.5 kg)  04/28/19 168 lb (76.2 kg)   Last seen for diabetes 6 months ago.  Management since then includes continue current medication. He reports good compliance with treatment. He is not having side effects.  Symptoms: No fatigue No foot ulcerations  No appetite changes No nausea  No paresthesia of the feet  No polydipsia  No polyuria No visual disturbances   No vomiting     Home blood sugar records: not being checked  Episodes of hypoglycemia? No    Current insulin regiment: takes Trulicity  Most Recent Eye Exam: due  Current exercise: none Current diet habits: well balanced  Pertinent Labs: Lab Results  Component Value Date   CHOL 143 06/22/2019   HDL 48 06/22/2019   LDLCALC 67 12/21/2018   TRIG 118 06/22/2019   CHOLHDL 3.4 12/21/2018   Lab Results  Component Value Date   NA 137 06/22/2019   K 4.8 06/22/2019   CREATININE 1.3 06/22/2019   GFRNONAA 63 06/22/2019   GFRAA 94 06/20/2018   GLUCOSE 268 (H) 06/20/2018     Lipid/Cholesterol, Follow-up  Last lipid panel Other pertinent labs  Lab Results  Component Value Date   CHOL 143 06/22/2019   HDL 48 06/22/2019   LDLCALC 67 12/21/2018   TRIG 118 06/22/2019   CHOLHDL 3.4 12/21/2018   Lab Results  Component Value Date   ALT 17 06/22/2019   AST 24 06/22/2019   PLT 211 06/20/2018   TSH 1.13 06/22/2019     He was last seen for this 6 months ago.  Management since that visit  includes continue statin.  He reports fair compliance with treatment. He is not having side effects.   Symptoms: No chest pain No chest pressure/discomfort  No dyspnea No lower extremity edema  No numbness or tingling of extremity No orthopnea  No palpitations No paroxysmal nocturnal dyspnea  No speech difficulty No syncope   Current diet: in general, an "unhealthy" diet Current exercise: aerobics  The 10-year ASCVD risk score Mikey Bussing DC Jr., et al., 2013) is: 9.4%* (Cholesterol units were assumed)  ---------------------------------------------------------------------------------------------------  Memory Loss Patient reports that his memory is getting worse. He reports that it is becoming more bothersome. He reports some days it is normal and other days he has trouble remembering what he is supposed to do. Wife reports patient is having difficulty hearing and wonders if he is having problems hearing. Wife reports TV goes up very high and he seems like he doesn't hear her. He has difficulty hearing GPS directions in the care.   MMSE - Mini Mental State Exam 02/19/2020  Orientation to time 5  Orientation to Place 5  Registration 3  Attention/ Calculation 4  Recall 2  Language- name 2 objects 2  Language- repeat 1  Language- follow 3 step command 3  Language- read & follow direction 1  Write a  sentence 1  Copy design 1  Total score 28   Depression, Follow-up  He  was last seen for this 1 years ago. Changes made at last visit include none.  Reports worsening symptoms of anxiety and depression. Previously behavioral health admission in 2018 resulted in trial of multiple medications including effexor, gabapentin, lithium. Patient was weaned off these medications since then and has not been treated for mood disorders.   Depression screen [facility] 2/9 07/28/2019 04/28/2019 01/27/2019  Decreased Interest 1 0 0  Down, Depressed, Hopeless 2 0 1  PHQ - 2 Score 3 0 1  Altered sleeping 0 0 2   Tired, decreased energy 2 0 2  Change in appetite 0 0 0  Feeling bad or failure about yourself  1 0 1  Trouble concentrating 1 0 2  Moving slowly or fidgety/restless 0 0 0  Suicidal thoughts 0 0 0  PHQ-9 Score 7 0 8  Difficult doing work/chores Somewhat difficult - Not difficult at all    ----------------------------------------------------------------------------------------- Anxiety, Follow-up  He was last seen for anxiety 1 years ago. Symptoms: No chest pain No difficulty concentrating  No dizziness No fatigue  No feelings of losing control No insomnia  Yes irritable No palpitations  No panic attacks No racing thoughts  No shortness of breath No sweating  No tremors/shakes    GAD-7 Results No flowsheet data found.  PHQ-9 Scores PHQ9 SCORE ONLY 07/28/2019 04/28/2019 01/27/2019  PHQ-9 Total Score 7 0 8    ---------------------------------------------------------------------------------------------------       Medications: Outpatient Medications Prior to Visit  Medication Sig  . atorvastatin (LIPITOR) 20 MG tablet TAKE ONE TABLET BY MOUTH EVERY EVENING  . Blood Glucose Monitoring Suppl (ONE TOUCH ULTRA 2) w/Device KIT USE UTD  . Lancets 30G MISC by Does not apply route.  . Melatonin 3 MG TABS Take 9 mg by mouth.  . Multiple Vitamin (MULTI-VITAMINS) TABS Take by mouth.  . [DISCONTINUED] FARXIGA 10 MG TABS tablet TAKE ONE TABLET BY MOUTH DAILY  . [DISCONTINUED] tadalafil (CIALIS) 20 MG tablet TAKE ONE TABLET BY MOUTH DAILY AS NEEDED FOR ERECTILE DYSFUNCTION; TAKE ONE HOUR PRIOR TO INTERCOURSE  . [DISCONTINUED] TRULICITY 1.5 PZ/0.2HE SOPN INJECT 1.5 MG UNDER THE SKIN ONCE WEEKLY   No facility-administered medications prior to visit.    Review of Systems  Constitutional: Negative.   Respiratory: Negative.   Cardiovascular: Negative.   Neurological: Negative.       Objective    BP 130/81   Pulse 94   Temp 98.9 F (37.2 C)   Wt 169 lb (76.7 kg)   SpO2 100%    BMI 25.70 kg/m    Physical Exam Constitutional:      Appearance: Normal appearance.  Cardiovascular:     Rate and Rhythm: Normal rate and regular rhythm.     Heart sounds: Normal heart sounds.  Pulmonary:     Effort: Pulmonary effort is normal.     Breath sounds: Normal breath sounds.  Skin:    General: Skin is warm and dry.  Neurological:     Mental Status: He is alert and oriented to person, place, and time. Mental status is at baseline.  Psychiatric:        Mood and Affect: Mood normal.        Behavior: Behavior normal.       Results for orders placed or performed in visit on 02/19/20  POCT glycosylated hemoglobin (Hb A1C)  Result Value Ref Range   Hemoglobin A1C  8.9 (A) 4.0 - 5.6 %   HbA1c POC (<> result, manual entry)     HbA1c, POC (prediabetic range)     HbA1c, POC (controlled diabetic range)      Assessment & Plan    1. Type 2 diabetes mellitus without complication, without long-term current use of insulin ([place])  Uncontrolled with last A1c 8.9%. Patient reports diet has been poor. Would like to take 3 months to work on diet and if A1c is elevated, will adjust medications. Continue current medications UTD on vaccines, foot exam. Requesting [facility] exam from [facility].  On Statin Discussed diet and exercise F/u in 3 months  - POCT glycosylated hemoglobin (Hb A1C) - dapagliflozin propanediol (FARXIGA) 10 MG TABS tablet; Take 1 tablet (10 mg total) by mouth daily.  Dispense: 90 tablet; Refill: 1 - Dulaglutide (TRULICITY) 1.5 XB/9.3JQ SOPN; INJECT 1.5 MG UNDER THE SKIN ONCE WEEKLY  Dispense: 6 mL; Refill: 1  2. Hyperlipidemia associated with type 2 diabetes mellitus (HCC)  Continue statin.  3. Hearing loss, unspecified hearing loss type, unspecified laterality  Bilateral cerumen impaction, advised on debrox and rinsing. Likely reducing his hearing but may have baseline hearing loss.  - Ambulatory referral to ENT  4. Erectile dysfunction, unspecified erectile  dysfunction type  - tadalafil (CIALIS) 20 MG tablet; TAKE ONE TABLET BY MOUTH DAILY AS NEEDED FOR ERECTILE DYSFUNCTION; TAKE ONE HOUR PRIOR TO INTERCOURSE  Dispense: 6 tablet; Refill: 0  5. Anxiety and depression  Worsening. Patient would like to start counseling, does not wish to start medications at this time.   - Ambulatory referral to Chronic Care Management Services  6. Bilateral impacted cerumen   7. Need for influenza vaccination  - Flu Vaccine QUAD 6+ mos PF IM (Fluarix Quad PF)    No follow-ups on file.      ITrinna Post, PA-C, have reviewed all documentation for this visit. The documentation on 02/20/20 for the exam, diagnosis, procedures, and orders are all accurate and complete.  The entirety of the information documented in the History of Present Illness, Review of Systems and Physical Exam were personally obtained by me. Portions of this information were initially documented by [facility] and reviewed by me for thoroughness and accuracy.     Paulene Floor  [place] [phone] (phone) [phone] (fax)  [place]

## 2020-02-19 ENCOUNTER — Encounter: Payer: Self-pay | Admitting: Physician Assistant

## 2020-02-19 ENCOUNTER — Ambulatory Visit (INDEPENDENT_AMBULATORY_CARE_PROVIDER_SITE_OTHER): Payer: BC Managed Care – PPO | Admitting: Physician Assistant

## 2020-02-19 ENCOUNTER — Other Ambulatory Visit: Payer: Self-pay

## 2020-02-19 VITALS — BP 130/81 | HR 94 | Temp 98.9°F | Wt 169.0 lb

## 2020-02-19 DIAGNOSIS — E785 Hyperlipidemia, unspecified: Secondary | ICD-10-CM

## 2020-02-19 DIAGNOSIS — Z23 Encounter for immunization: Secondary | ICD-10-CM | POA: Diagnosis not present

## 2020-02-19 DIAGNOSIS — H6123 Impacted cerumen, bilateral: Secondary | ICD-10-CM

## 2020-02-19 DIAGNOSIS — H9190 Unspecified hearing loss, unspecified ear: Secondary | ICD-10-CM

## 2020-02-19 DIAGNOSIS — F419 Anxiety disorder, unspecified: Secondary | ICD-10-CM

## 2020-02-19 DIAGNOSIS — N529 Male erectile dysfunction, unspecified: Secondary | ICD-10-CM

## 2020-02-19 DIAGNOSIS — E119 Type 2 diabetes mellitus without complications: Secondary | ICD-10-CM | POA: Diagnosis not present

## 2020-02-19 DIAGNOSIS — E1169 Type 2 diabetes mellitus with other specified complication: Secondary | ICD-10-CM

## 2020-02-19 DIAGNOSIS — F32A Depression, unspecified: Secondary | ICD-10-CM

## 2020-02-19 LAB — POCT GLYCOSYLATED HEMOGLOBIN (HGB A1C): Hemoglobin A1C: 8.9 % — AB (ref 4.0–5.6)

## 2020-02-19 MED ORDER — TADALAFIL 20 MG PO TABS: ORAL_TABLET | ORAL | 0 refills | Status: AC

## 2020-02-19 MED ORDER — DAPAGLIFLOZIN PROPANEDIOL 10 MG PO TABS: 10.0000 mg | ORAL_TABLET | Freq: Every day | ORAL | 1 refills | Status: AC

## 2020-02-19 MED ORDER — TRULICITY 1.5 MG/0.5ML ~~LOC~~ SOAJ: SUBCUTANEOUS | 1 refills | Status: AC

## 2020-02-21 ENCOUNTER — Telehealth: Payer: Self-pay | Admitting: *Deleted

## 2020-02-21 NOTE — Chronic Care Management (AMB) (Signed)
  Care Management   Outreach Note  02/21/2020 Name: Jeffrey Velazquez MRN: [id number] DOB: 1961/03/07  Jeffrey Velazquez is a 58 y.o. year old male who is a primary care patient of Trey Sailors, [place]. I reached out to [name] by phone today in response to a referral sent by Mr. Tysen Roesler Hofmeister's PCP, Trey Sailors, PA-C.     An unsuccessful telephone outreach was attempted today. The patient was referred to the case management team for assistance with care management and care coordination.   Follow Up Plan: A HIPAA compliant phone message was left for the patient providing contact information and requesting a return call. The care management team will reach out to the patient again over the next 7 days. If patient returns call to provider office, please advise to call Embedded Care Management Care Guide Gwenevere Ghazi at [phone].  Gwenevere Ghazi  Care Guide, Embedded Care Coordination [facility] Management

## 2020-02-22 DIAGNOSIS — E119 Type 2 diabetes mellitus without complications: Secondary | ICD-10-CM | POA: Diagnosis not present

## 2020-02-22 DIAGNOSIS — H6121 Impacted cerumen, right ear: Secondary | ICD-10-CM | POA: Diagnosis not present

## 2020-02-22 DIAGNOSIS — R413 Other amnesia: Secondary | ICD-10-CM | POA: Diagnosis not present

## 2020-02-22 DIAGNOSIS — B0229 Other postherpetic nervous system involvement: Secondary | ICD-10-CM | POA: Diagnosis not present

## 2020-02-26 NOTE — Chronic Care Management (AMB) (Signed)
  Care Management   Note  02/26/2020 Name: Tyronne Blann MRN: [id number] DOB: [birth year]  Edi Gorniak is a 59 y.o. year old male who is a primary care patient of Trey Sailors, [place]. I reached out to [name] by phone today in response to a referral sent by Mr. Bianca Vester [place]ta's health plan.    Mr. Lina was given information about care management services today including:  1. Care management services include personalized support from designated clinical staff supervised by his physician, including individualized plan of care and coordination with other care providers 2. 24/7 contact phone numbers for assistance for urgent and routine care needs. 3. The patient may stop care management services at any time by phone call to the office staff.  Patient agreed to services and verbal consent obtained.   Follow up plan: Telephone appointment with care management team member scheduled for: 03/07/2020  [facility] Guide, Embedded Care Coordination [facility] Management

## 2020-03-07 ENCOUNTER — Telehealth: Payer: Self-pay | Admitting: *Deleted

## 2020-03-07 ENCOUNTER — Telehealth: Payer: BC Managed Care – PPO

## 2020-03-07 NOTE — Telephone Encounter (Signed)
   Chronic Care Management   Unsuccessful Call Note 03/07/2020 Name: Jeffrey Velazquez MRN: [id number] DOB: 1960/10/19  Patient  is a 59 year old male who sees Osvaldo Angst, [place] for primary care. Osvaldo Angst, PA-C asked the CCM team to consult the patient for Mental Health Counseling and  Resources.   This social worker was unable to reach patient via telephone today for initial call. I have left HIPAA compliant voicemail asking patient to return my call. (unsuccessful outreach #1).   Plan: Will follow-up within 7 business days via telephone.      Verna Czech, LCSW Clinical Social Worker  [facility] Care Management [phone]

## 2020-03-14 ENCOUNTER — Telehealth: Payer: BC Managed Care – PPO

## 2020-03-14 ENCOUNTER — Telehealth: Payer: Self-pay | Admitting: *Deleted

## 2020-03-14 NOTE — Telephone Encounter (Signed)
    Care Management   Unsuccessful Call Note 03/14/2020 Name: Jeffrey Velazquez MRN: [id number] DOB: 1960-09-02  Patient  is a 59 year old male who sees Osvaldo Angst, [place] for primary care. Osvaldo Angst, PA-C asked the CCM team to consult the patient for Mental Health Counseling and Resources.    This social worker was unable to reach patient via telephone today for initial call. I have left HIPAA compliant voicemail asking patient to return my call. (unsuccessful outreach #2).   Plan: Will follow-up within 7 business days via telephone.      Verna Czech, LCSW Clinical Social Worker  [facility] Care Management [phone]

## 2020-03-22 ENCOUNTER — Telehealth: Payer: BC Managed Care – PPO | Admitting: *Deleted

## 2020-03-25 ENCOUNTER — Telehealth: Payer: Self-pay | Admitting: *Deleted

## 2020-03-25 ENCOUNTER — Telehealth: Payer: BC Managed Care – PPO | Admitting: *Deleted

## 2020-03-25 NOTE — Telephone Encounter (Signed)
   Chronic Care Management   Unsuccessful Call Note 03/25/2020 Name: Jeffrey Velazquez MRN: [id number] DOB: November 10, 1960  Patient  is a 59 year old male who sees Osvaldo Angst, [place] for primary care. Osvaldo Angst, PA-C asked the CCM team to consult the patient for Mental Health Counseling and Resources.      This social worker was unable to reach patient via telephone today for initial call. I have left HIPAA compliant voicemail asking patient to return my call. (unsuccessful outreach #3).   Plan: This Child psychotherapist will not make any additional outreach calls to patient, however will be glad to engage patient upon his return call .     Verna Czech, LCSW Clinical Social Worker  [facility] Care Management [phone]

## 2020-03-28 ENCOUNTER — Ambulatory Visit: Payer: Self-pay

## 2020-03-28 NOTE — Telephone Encounter (Signed)
Started having sinus drainage 2 days ago. Clear drainage. No fever. Dry cough, but is having chest tightness. Feels like chest tightness is due to cough. Pain is in middle of chest. No radiation. No nausea or dizziness. Pt. Has appointment for tomorrow. Offered appointment for today, declines. Instructed to go to ED for worsening of symptoms.  Reason for Disposition . [1] Chest pain(s) lasting a few seconds from coughing AND [2] persists > 3 days  Answer Assessment - Initial Assessment Questions 1. LOCATION: "Where does it hurt?"       Middle 2. RADIATION: "Does the pain go anywhere else?" (e.g., into neck, jaw, arms, back)      No 3. ONSET: "When did the chest pain begin?" (Minutes, hours or days)      This morning 4. PATTERN "Does the pain come and go, or has it been constant since it started?"  "Does it get worse with exertion?"      Constant 5. DURATION: "How long does it last" (e.g., seconds, minutes, hours)     n/a 6. SEVERITY: "How bad is the pain?"  (e.g., Scale 1-10; mild, moderate, or severe)    - MILD (1-3): doesn't interfere with normal activities     - MODERATE (4-7): interferes with normal activities or awakens from sleep    - SEVERE (8-10): excruciating pain, unable to do any normal activities       6 7. CARDIAC RISK FACTORS: "Do you have any history of heart problems or risk factors for heart disease?" (e.g., angina, prior heart attack; diabetes, high blood pressure, high cholesterol, smoker, or strong family history of heart disease)     No 8. PULMONARY RISK FACTORS: "Do you have any history of lung disease?"  (e.g., blood clots in lung, asthma, emphysema, birth control pills)     No 9. CAUSE: "What do you think is causing the chest pain?"      Cough 10. OTHER SYMPTOMS: "Do you have any other symptoms?" (e.g., dizziness, nausea, vomiting, sweating, fever, difficulty breathing, cough)       Shortness of breath 11. PREGNANCY: "Is there any chance you are pregnant?" "When  was your last menstrual period?"       n/a  Protocols used: CHEST PAIN-A-AH

## 2020-03-28 NOTE — Telephone Encounter (Signed)
Scheduled as virtual for tomorrow. KW

## 2020-03-29 ENCOUNTER — Ambulatory Visit (INDEPENDENT_AMBULATORY_CARE_PROVIDER_SITE_OTHER): Payer: BC Managed Care – PPO | Admitting: Adult Health

## 2020-03-29 ENCOUNTER — Encounter: Payer: Self-pay | Admitting: Adult Health

## 2020-03-29 DIAGNOSIS — R1310 Dysphagia, unspecified: Secondary | ICD-10-CM | POA: Diagnosis not present

## 2020-03-29 DIAGNOSIS — H9202 Otalgia, left ear: Secondary | ICD-10-CM | POA: Diagnosis not present

## 2020-03-29 DIAGNOSIS — R599 Enlarged lymph nodes, unspecified: Secondary | ICD-10-CM | POA: Diagnosis not present

## 2020-03-29 DIAGNOSIS — J029 Acute pharyngitis, unspecified: Secondary | ICD-10-CM | POA: Diagnosis not present

## 2020-03-29 DIAGNOSIS — J069 Acute upper respiratory infection, unspecified: Secondary | ICD-10-CM | POA: Diagnosis not present

## 2020-03-29 DIAGNOSIS — R0789 Other chest pain: Secondary | ICD-10-CM

## 2020-03-29 DIAGNOSIS — R059 Cough, unspecified: Secondary | ICD-10-CM

## 2020-03-29 MED ORDER — AMOXICILLIN-POT CLAVULANATE 875-125 MG PO TABS: 1.0000 | ORAL_TABLET | Freq: Two times a day (BID) | ORAL | 0 refills | Status: AC

## 2020-03-29 MED ORDER — PREDNISONE 10 MG (21) PO TBPK: ORAL_TABLET | ORAL | 0 refills | Status: AC

## 2020-03-29 MED ORDER — BENZONATATE 100 MG PO CAPS: 100.0000 mg | ORAL_CAPSULE | Freq: Every evening | ORAL | 0 refills | Status: AC | PRN

## 2020-03-29 NOTE — Progress Notes (Signed)
Virtual telephone visit    Virtual Visit via Telephone Note   This visit type was conducted due to national recommendations for restrictions regarding the COVID-19 Pandemic (e.g. social distancing) in an effort to limit this patient's exposure and mitigate transmission in our community. Due to his co-morbid illnesses, this patient is at least at moderate risk for complications without adequate follow up. This format is felt to be most appropriate for this patient at this time. The patient did not have access to video technology or had technical difficulties with video requiring transitioning to audio format only (telephone). Physical exam was limited to content and character of the telephone converstion.    Patient location: at home  Provider location: Provider: Provider's office at  [facility], [place].     I discussed the limitations of evaluation and management by telemedicine and the availability of in person appointments. The patient expressed understanding and agreed to proceed.   Visit Date: 03/29/2020  Today's healthcare provider: Marcille Buffy, FNP   Chief Complaint  Patient presents with  . Sore Throat   Subjective    Sore Throat  This is a new problem. The current episode started in the past 7 days. The problem has been gradually worsening. There has been no fever. Associated symptoms include congestion, coughing, ear pain, headaches, a hoarse voice, neck pain, shortness of breath and swollen glands. Pertinent negatives include no abdominal pain, diarrhea, drooling, ear discharge, plugged ear sensation, stridor, trouble swallowing or vomiting. Associated symptoms comments: Painful swallowing. Marland Kitchen He has had no exposure to strep or mono. He has tried nothing for the symptoms.    Son  Has been sick with respiratory symptoms last week improved now.   Onset of symptoms 3 days ago,  Glands are swollen, bilateral ear pain, however right worse  than left ear.  Denies any fever. Denies any ear drainage. Is taking Tylenol/ Ibuprofen occasional.  Denies any loss of taste or smell.  Headache.  Chest tightness. Denies any chest pan.   Denies any pulse oximetry at home.  He is mildly short of breath.   Coughing spells.  Denies any palpitations.   Patient  denies any fever, body aches,chills, rash, chest pain, shortness of breath, nausea, vomiting, or diarrhea.    Patient Active Problem List   Diagnosis Date Noted  . Feeling of chest tightness 04/01/2020  . Cough 04/01/2020  . Sore throat 04/01/2020  . Upper respiratory tract infection 03/29/2020  . Ear pain, left 03/29/2020  . Glands swollen 03/29/2020  . Painful swallowing 03/29/2020  . Hyperlipidemia associated with type 2 diabetes mellitus ([place]) 09/20/2018  . BPPV (benign paroxysmal positional vertigo) 06/09/2017  . MDD (major depressive disorder), severe ([place]) 06/07/2017  . Moderate episode of recurrent major depressive disorder ([place]) 03/10/2017  . Diabetes ([place]) 02/21/2017  . GERD (gastroesophageal reflux disease) 02/21/2017  . Vitamin B12 deficiency 02/21/2017  . Severe episode of recurrent major depressive disorder, with psychotic features ([place]) 02/20/2017  . Insomnia due to mental disorder   . Anxiety with somatization 11/13/2016  . Pain, generalized 11/05/2016  . Chest pain 10/29/2016  . Sinus tachycardia 10/29/2016  . Hyponatremia 10/29/2016  . Hypokalemia 10/29/2016  . Diarrhea, unspecified 10/26/2016  . DOE (dyspnea on exertion) 10/13/2016  . Early satiety 10/13/2016  . Epigastric pain 10/13/2016  . Unintended weight loss 10/13/2016  . Post herpetic neuralgia 07/23/2016  . Hyperlipemia, mixed 06/08/2016  . Type 2 diabetes mellitus without complication, without long-term current use  of insulin ([place]) 06/08/2016  . Vaccine counseling 05/21/2016   Past Medical History:  Diagnosis Date  . Anemia   . Anxiety   . Depression   . Diabetes mellitus without  complication ([place])   . Diabetes mellitus, type II ([place])   . DOE (dyspnea on exertion)   . Early satiety   . Epigastric abdominal pain   . GERD (gastroesophageal reflux disease)   . Hyperlipidemia   . Irritable bowel syndrome   . Shingles   . Tachycardia   . Unintended weight loss    Allergies  Allergen Reactions  . Codeine Nausea Only and Other (See Comments)    dizziness  . Trazodone And Nefazodone     Pt states "I can't walk".       Medications: Outpatient Medications Prior to Visit  Medication Sig  . atorvastatin (LIPITOR) 20 MG tablet TAKE ONE TABLET BY MOUTH EVERY EVENING  . Blood Glucose Monitoring Suppl (ONE TOUCH ULTRA 2) w/Device KIT USE UTD  . dapagliflozin propanediol (FARXIGA) 10 MG TABS tablet Take 1 tablet (10 mg total) by mouth daily.  . Dulaglutide (TRULICITY) 1.5 AT/5.5DD SOPN INJECT 1.5 MG UNDER THE SKIN ONCE WEEKLY  . Lancets 30G MISC by Does not apply route.  . Melatonin 3 MG TABS Take 9 mg by mouth.  . Multiple Vitamin (MULTI-VITAMINS) TABS Take by mouth.  . tadalafil (CIALIS) 20 MG tablet TAKE ONE TABLET BY MOUTH DAILY AS NEEDED FOR ERECTILE DYSFUNCTION; TAKE ONE HOUR PRIOR TO INTERCOURSE   No facility-administered medications prior to visit.    Review of Systems  HENT: Positive for congestion, ear pain and hoarse voice. Negative for drooling, ear discharge and trouble swallowing.   Respiratory: Positive for cough and shortness of breath. Negative for stridor.   Gastrointestinal: Negative for abdominal pain, diarrhea and vomiting.  Musculoskeletal: Positive for neck pain.  Neurological: Positive for headaches.      Objective    There were no vitals taken for this visit.   Patient is alert and oriented and responsive to questions Engages in conversation with provider. Speaks in full sentences without any pauses without any shortness of breath or distress.     Assessment & Plan     Upper respiratory tract infection, unspecified type - Plan:  COVID-19, Flu A+B and RSV  Ear pain, left - Plan: amoxicillin-clavulanate (AUGMENTIN) 875-125 MG tablet  Glands swollen - Plan: predniSONE (STERAPRED UNI-PAK 21 TAB) 10 MG (21) TBPK tablet  Painful swallowing - Plan: predniSONE (STERAPRED UNI-PAK 21 TAB) 10 MG (21) TBPK tablet  Feeling of chest tightness - Plan: predniSONE (STERAPRED UNI-PAK 21 TAB) 10 MG (21) TBPK tablet, Specimen status report  Cough - Plan: predniSONE (STERAPRED UNI-PAK 21 TAB) 10 MG (21) TBPK tablet, benzonatate (TESSALON) 100 MG capsule, Specimen status report  Sore throat - Plan: Culture, Group A Strep, Specimen status report   Meds ordered this encounter  Medications  . amoxicillin-clavulanate (AUGMENTIN) 875-125 MG tablet    Sig: Take 1 tablet by mouth 2 (two) times daily.    Dispense:  20 tablet    Refill:  0  . predniSONE (STERAPRED UNI-PAK 21 TAB) 10 MG (21) TBPK tablet    Sig: PO: Take 6 tablets on day 1:Take 5 tablets day 2:Take 4 tablets day 3: Take 3 tablets day 4:Take 2 tablets day five: 5 Take 1 tablet day 6    Dispense:  21 tablet    Refill:  0  . benzonatate (TESSALON) 100 MG capsule  Sig: Take 1 capsule (100 mg total) by mouth at bedtime and may repeat dose one time if needed.    Dispense:  25 capsule    Refill:  0   Return if symptoms worsen or fail to improve, for at any time for any worsening symptoms, Go to Emergency room/ urgent care if worse.    I discussed the assessment and treatment plan with the patient. The patient was provided an opportunity to ask questions and all were answered. The patient agreed with the plan and demonstrated an understanding of the instructions.   The patient was advised to call back or seek an in-person evaluation if the symptoms worsen or if the condition fails to improve as anticipated.  I provided 20 minutes of non-face-to-face time during this encounter.  I discussed the limitations of evaluation and management by telemedicine and the availability of  in person appointments. The patient expressed understanding and agreed to proceed.   Marcille Buffy, [place] [phone] (phone) [phone] (fax)  [place]

## 2020-03-31 LAB — COVID-19, FLU A+B AND RSV
Influenza A, NAA: NOT DETECTED
Influenza B, NAA: NOT DETECTED
RSV, NAA: NOT DETECTED
SARS-CoV-2, NAA: NOT DETECTED

## 2020-03-31 LAB — SPECIMEN STATUS REPORT

## 2020-04-01 DIAGNOSIS — J029 Acute pharyngitis, unspecified: Secondary | ICD-10-CM | POA: Insufficient documentation

## 2020-04-01 DIAGNOSIS — R0789 Other chest pain: Secondary | ICD-10-CM | POA: Insufficient documentation

## 2020-04-01 DIAGNOSIS — R059 Cough, unspecified: Secondary | ICD-10-CM | POA: Insufficient documentation

## 2020-04-01 LAB — CULTURE, GROUP A STREP: Strep A Culture: NEGATIVE

## 2020-04-01 NOTE — Progress Notes (Signed)
Negative for covid, influenza A and B and RSV.

## 2020-04-01 NOTE — Patient Instructions (Signed)
Cough, Adult A cough helps to clear your throat and lungs. A cough may be a sign of an illness or another medical condition. An acute cough may only last 2-3 weeks, while a chronic cough may last 8 or more weeks. Many things can cause a cough. They include:  Germs (viruses or bacteria) that attack the airway.  Breathing in things that bother (irritate) your lungs.  Allergies.  Asthma.  Mucus that runs down the back of your throat (postnasal drip).  Smoking.  Acid backing up from the stomach into the tube that moves food from the mouth to the stomach (gastroesophageal reflux).  Some medicines.  Lung problems.  Other medical conditions, such as heart failure or a blood clot in the lung (pulmonary embolism). Follow these instructions at home: Medicines  Take over-the-counter and prescription medicines only as told by your doctor.  Talk with your doctor before you take medicines that stop a cough (coughsuppressants). Lifestyle   Do not smoke, and try not to be around smoke. Do not use any products that contain nicotine or tobacco, such as cigarettes, e-cigarettes, and chewing tobacco. If you need help quitting, ask your doctor.  Drink enough fluid to keep your pee (urine) pale yellow.  Avoid caffeine.  Do not drink alcohol if your doctor tells you not to drink. General instructions   Watch for any changes in your cough. Tell your doctor about them.  Always cover your mouth when you cough.  Stay away from things that make you cough, such as perfume, candles, campfire smoke, or cleaning products.  If the air is dry, use a cool mist vaporizer or humidifier in your home.  If your cough is worse at night, try using extra pillows to raise your head up higher while you sleep.  Rest as needed.  Keep all follow-up visits as told by your doctor. This is important. Contact a doctor if:  You have new symptoms.  You cough up pus.  Your cough does not get better after 2-3  weeks, or your cough gets worse.  Cough medicine does not help your cough and you are not sleeping well.  You have pain that gets worse or pain that is not helped with medicine.  You have a fever.  You are losing weight and you do not know why.  You have night sweats. Get help right away if:  You cough up blood.  You have trouble breathing.  Your heartbeat is very fast. These symptoms may be an emergency. Do not wait to see if the symptoms will go away. Get medical help right away. Call your local emergency services (911 in the [place] Do not drive yourself to the hospital. Summary  A cough helps to clear your throat and lungs. Many things can cause a cough.  Take over-the-counter and prescription medicines only as told by your doctor.  Always cover your mouth when you cough.  Contact a doctor if you have new symptoms or you have a cough that does not get better or gets worse. This information is not intended to replace advice given to you by your health care provider. Make sure you discuss any questions you have with your health care provider. Document Revised: 05/23/2018 Document Reviewed: 05/23/2018 Elsevier Patient Education  2020 Elsevier Inc. Earache, Adult An earache, or ear pain, can be caused by many things, including:  An infection.  Ear wax buildup.  Ear pressure.  Something in the ear that should not be there (foreign body).    A sore throat.  Tooth problems.  Jaw problems. Treatment of the earache will depend on the cause. If the cause is not clear or cannot be determined, you may need to watch your symptoms until your earache goes away or until a cause is found. Follow these instructions at home: Medicines  Take or apply over-the-counter and prescription medicines only as told by your health care provider.  If you were prescribed an antibiotic medicine, use it as told by your health care provider. Do not stop using the antibiotic even if you start to  feel better.  Do not put anything in your ear other than medicine that is prescribed by your health care provider. Managing pain If directed, apply heat to the affected area as often as told by your health care provider. Use the heat source that your health care provider recommends, such as a moist heat pack or a heating pad.  Place a towel between your skin and the heat source.  Leave the heat on for 20-30 minutes.  Remove the heat if your skin turns bright red. This is especially important if you are unable to feel pain, heat, or cold. You may have a greater risk of getting burned. If directed, put ice on the affected area as often as told by your health care provider. To do this:      Put ice in a plastic bag.  Place a towel between your skin and the bag.  Leave the ice on for 20 minutes, 2-3 times a day. General instructions  Pay attention to any changes in your symptoms.  Try resting in an upright position instead of lying down. This may help to reduce pressure in your ear and relieve pain.  Chew gum if it helps to relieve your ear pain.  Treat any allergies as told by your health care provider.  Drink enough fluid to keep your urine pale yellow.  It is up to you to get the results of any tests that were done. Ask your health care provider, or the department that is doing the tests, when your results will be ready.  Keep all follow-up visits as told by your health care provider. This is important. Contact a health care provider if:  Your pain does not improve within 2 days.  Your earache gets worse.  You have new symptoms.  You have a fever. Get help right away if you:  Have a severe headache.  Have a stiff neck.  Have trouble swallowing.  Have redness or swelling behind your ear.  Have fluid or blood coming from your ear.  Have hearing loss.  Feel dizzy. Summary  An earache, or ear pain, can be caused by many things.  Treatment of the earache will  depend on the cause. Follow recommendations from your health care provider to treat your ear pain.  If the cause is not clear or cannot be determined, you may need to watch your symptoms until your earache goes away or until a cause is found.  Keep all follow-up visits as told by your health care provider. This is important. This information is not intended to replace advice given to you by your health care provider. Make sure you discuss any questions you have with your health care provider. Document Revised: 12/10/2018 Document Reviewed: 12/10/2018 Elsevier Patient Education  2020 Elsevier Inc.  

## 2020-05-22 ENCOUNTER — Telehealth: Payer: Self-pay | Admitting: Physician Assistant

## 2020-05-22 NOTE — Telephone Encounter (Signed)
Pt called to cancel his appt due to not having insurance at the time / Pt asked what options he has as far as his Dulaglutide (TRULICITY) 1.5 MG/0.5ML SOPN /pt stated he knows this medication is pretty expensive and now that he doesn't have insurance he would like to know what he can do to help with the cost while not having insurance / pt asked for a call about options / please advise

## 2020-05-22 NOTE — Telephone Encounter (Signed)
Please review. Thanks!  

## 2020-05-24 ENCOUNTER — Ambulatory Visit: Payer: Self-pay | Admitting: Physician Assistant

## 2020-05-24 NOTE — Telephone Encounter (Signed)
Very difficult to get financial aid for this or an affordable out of pocket price. Is he going to come into insurance soon?

## 2020-05-26 ENCOUNTER — Encounter (INDEPENDENT_AMBULATORY_CARE_PROVIDER_SITE_OTHER): Payer: Self-pay

## 2020-05-27 NOTE — Telephone Encounter (Signed)
Called patient and no answer. LVMTRC if patient calls back okay for PEC to advise. 

## 2020-06-04 NOTE — Telephone Encounter (Signed)
Spoke to patient and he states that he looking at different insurance companies now to see which company he can afford to pay monthly. He states he will call and let you know when he gets insurance. Just a [place]

## 2020-06-04 NOTE — Telephone Encounter (Signed)
Ok, if it will be a while we can switch to glimeperide. He was previously on this and it may not work as well but it is typically very affordable to have in the mean time.

## 2020-06-04 NOTE — Telephone Encounter (Signed)
Patient was advised and states that he stopped taking the Glimeperide due to upset stomach and other side effects. Just a [place]
